# Patient Record
Sex: Female | Born: 1970 | Race: White | Hispanic: No | Marital: Single | Smoking: Former smoker
Health system: Southern US, Community
[De-identification: ages and names within clinical notes are randomized; demographics above are authoritative.]

## PROBLEM LIST (undated history)

## (undated) DIAGNOSIS — T7840XA Allergy, unspecified, initial encounter: Secondary | ICD-10-CM

## (undated) DIAGNOSIS — R112 Nausea with vomiting, unspecified: Secondary | ICD-10-CM

## (undated) DIAGNOSIS — K589 Irritable bowel syndrome without diarrhea: Secondary | ICD-10-CM

## (undated) DIAGNOSIS — Z9889 Other specified postprocedural states: Secondary | ICD-10-CM

## (undated) DIAGNOSIS — K602 Anal fissure, unspecified: Secondary | ICD-10-CM

## (undated) DIAGNOSIS — C4491 Basal cell carcinoma of skin, unspecified: Secondary | ICD-10-CM

## (undated) DIAGNOSIS — IMO0002 Reserved for concepts with insufficient information to code with codable children: Secondary | ICD-10-CM

## (undated) DIAGNOSIS — E119 Type 2 diabetes mellitus without complications: Secondary | ICD-10-CM

## (undated) DIAGNOSIS — F32A Depression, unspecified: Secondary | ICD-10-CM

## (undated) HISTORY — DX: Basal cell carcinoma of skin, unspecified: C44.91

## (undated) HISTORY — DX: Allergy, unspecified, initial encounter: T78.40XA

## (undated) HISTORY — DX: Reserved for concepts with insufficient information to code with codable children: IMO0002

## (undated) HISTORY — DX: Irritable bowel syndrome, unspecified: K58.9

## (undated) HISTORY — PX: WISDOM TOOTH EXTRACTION: SHX21

## (undated) HISTORY — PX: EYE SURGERY: SHX253

## (undated) HISTORY — DX: Anal fissure, unspecified: K60.2

---

## 1993-03-30 DIAGNOSIS — R87619 Unspecified abnormal cytological findings in specimens from cervix uteri: Secondary | ICD-10-CM

## 1993-03-30 DIAGNOSIS — IMO0002 Reserved for concepts with insufficient information to code with codable children: Secondary | ICD-10-CM

## 1993-03-30 HISTORY — DX: Reserved for concepts with insufficient information to code with codable children: IMO0002

## 1993-03-30 HISTORY — DX: Unspecified abnormal cytological findings in specimens from cervix uteri: R87.619

## 2005-05-28 ENCOUNTER — Encounter: Payer: Self-pay | Admitting: Family Medicine

## 2005-05-28 LAB — CONVERTED CEMR LAB

## 2006-02-04 ENCOUNTER — Encounter: Payer: Self-pay | Admitting: Family Medicine

## 2006-02-04 ENCOUNTER — Ambulatory Visit: Payer: Self-pay | Admitting: Family Medicine

## 2006-02-04 DIAGNOSIS — K581 Irritable bowel syndrome with constipation: Secondary | ICD-10-CM | POA: Insufficient documentation

## 2006-02-04 DIAGNOSIS — F32A Depression, unspecified: Secondary | ICD-10-CM | POA: Insufficient documentation

## 2006-02-04 DIAGNOSIS — K644 Residual hemorrhoidal skin tags: Secondary | ICD-10-CM | POA: Insufficient documentation

## 2006-02-04 DIAGNOSIS — F329 Major depressive disorder, single episode, unspecified: Secondary | ICD-10-CM

## 2006-04-05 ENCOUNTER — Ambulatory Visit: Payer: Self-pay | Admitting: Family Medicine

## 2006-04-05 DIAGNOSIS — M545 Low back pain, unspecified: Secondary | ICD-10-CM | POA: Insufficient documentation

## 2006-07-21 ENCOUNTER — Ambulatory Visit: Payer: Self-pay | Admitting: Family Medicine

## 2006-07-21 LAB — CONVERTED CEMR LAB
Ketones, urine, test strip: NEGATIVE
Nitrite: NEGATIVE
Specific Gravity, Urine: >=1.03
Urobilinogen, UA: 0.2
WBC Urine, dipstick: NEGATIVE

## 2006-07-22 ENCOUNTER — Encounter: Payer: Self-pay | Admitting: Family Medicine

## 2006-10-21 ENCOUNTER — Telehealth: Payer: Self-pay | Admitting: Family Medicine

## 2006-10-28 ENCOUNTER — Telehealth: Payer: Self-pay | Admitting: Family Medicine

## 2007-10-11 ENCOUNTER — Encounter: Payer: Self-pay | Admitting: Family

## 2007-10-11 ENCOUNTER — Ambulatory Visit: Payer: Self-pay | Admitting: Family

## 2008-02-22 ENCOUNTER — Ambulatory Visit: Payer: Self-pay | Admitting: Family Medicine

## 2008-03-30 DIAGNOSIS — C4491 Basal cell carcinoma of skin, unspecified: Secondary | ICD-10-CM

## 2008-03-30 HISTORY — DX: Basal cell carcinoma of skin, unspecified: C44.91

## 2008-03-30 HISTORY — PX: SKIN CANCER EXCISION: SHX779

## 2008-05-10 ENCOUNTER — Telehealth: Payer: Self-pay | Admitting: Family Medicine

## 2008-10-17 ENCOUNTER — Encounter: Payer: Self-pay | Admitting: Obstetrics & Gynecology

## 2008-10-17 ENCOUNTER — Ambulatory Visit: Payer: Self-pay | Admitting: Obstetrics & Gynecology

## 2008-10-18 ENCOUNTER — Encounter: Payer: Self-pay | Admitting: Obstetrics & Gynecology

## 2008-10-18 LAB — CONVERTED CEMR LAB
Glucose, Bld: 88 mg/dL (ref 70–99)
LDL Cholesterol: 117 mg/dL — ABNORMAL HIGH (ref 0–99)
Triglycerides: 155 mg/dL — ABNORMAL HIGH (ref <150)
VLDL: 31 mg/dL (ref 0–40)

## 2008-11-07 ENCOUNTER — Ambulatory Visit: Payer: Self-pay | Admitting: Obstetrics & Gynecology

## 2008-12-05 ENCOUNTER — Ambulatory Visit: Payer: Self-pay | Admitting: Family Medicine

## 2008-12-05 LAB — CONVERTED CEMR LAB: Rapid Strep: NEGATIVE

## 2008-12-13 ENCOUNTER — Telehealth: Payer: Self-pay | Admitting: Family Medicine

## 2008-12-27 ENCOUNTER — Telehealth: Payer: Self-pay | Admitting: Family Medicine

## 2008-12-28 ENCOUNTER — Ambulatory Visit: Payer: Self-pay | Admitting: Family Medicine

## 2008-12-28 DIAGNOSIS — J309 Allergic rhinitis, unspecified: Secondary | ICD-10-CM | POA: Insufficient documentation

## 2009-01-08 ENCOUNTER — Ambulatory Visit: Payer: Self-pay | Admitting: Family Medicine

## 2009-01-08 DIAGNOSIS — J45991 Cough variant asthma: Secondary | ICD-10-CM | POA: Insufficient documentation

## 2009-04-08 ENCOUNTER — Encounter: Payer: Self-pay | Admitting: Family Medicine

## 2009-04-08 ENCOUNTER — Ambulatory Visit: Payer: Self-pay | Admitting: Family Medicine

## 2009-06-20 ENCOUNTER — Ambulatory Visit: Payer: Self-pay | Admitting: Family Medicine

## 2009-06-21 ENCOUNTER — Encounter (INDEPENDENT_AMBULATORY_CARE_PROVIDER_SITE_OTHER): Payer: Self-pay | Admitting: *Deleted

## 2009-07-10 ENCOUNTER — Ambulatory Visit (HOSPITAL_COMMUNITY): Payer: Self-pay | Admitting: Licensed Clinical Social Worker

## 2009-07-10 ENCOUNTER — Ambulatory Visit: Payer: Self-pay | Admitting: Family Medicine

## 2009-07-24 ENCOUNTER — Ambulatory Visit: Payer: Self-pay | Admitting: Family Medicine

## 2009-07-24 ENCOUNTER — Ambulatory Visit (HOSPITAL_COMMUNITY): Payer: Self-pay | Admitting: Licensed Clinical Social Worker

## 2009-08-12 ENCOUNTER — Ambulatory Visit (HOSPITAL_COMMUNITY): Payer: Self-pay | Admitting: Licensed Clinical Social Worker

## 2009-08-30 ENCOUNTER — Ambulatory Visit: Payer: Self-pay | Admitting: Family Medicine

## 2009-09-17 ENCOUNTER — Ambulatory Visit (HOSPITAL_COMMUNITY): Payer: Self-pay | Admitting: Licensed Clinical Social Worker

## 2009-10-23 ENCOUNTER — Ambulatory Visit: Payer: Self-pay | Admitting: Obstetrics & Gynecology

## 2009-10-23 LAB — HM PAP SMEAR: HM Pap smear: NEGATIVE

## 2009-10-25 ENCOUNTER — Ambulatory Visit (HOSPITAL_COMMUNITY): Payer: Self-pay | Admitting: Licensed Clinical Social Worker

## 2009-12-24 ENCOUNTER — Ambulatory Visit (HOSPITAL_COMMUNITY): Payer: Self-pay | Admitting: Licensed Clinical Social Worker

## 2010-03-11 ENCOUNTER — Ambulatory Visit: Payer: Self-pay | Admitting: Family Medicine

## 2010-04-22 ENCOUNTER — Other Ambulatory Visit: Payer: Self-pay | Admitting: Family Medicine

## 2010-04-22 DIAGNOSIS — Z1239 Encounter for other screening for malignant neoplasm of breast: Secondary | ICD-10-CM

## 2010-04-29 NOTE — Letter (Signed)
Summary: Depression Questionnaire/[place] Kathryne Sharper  Depression Questionnaire/[place] Kathryne Sharper   Imported By: Lanelle Bal 07/16/2009 12:37:05  _____________________________________________________________________  External Attachment:    Type:   Image     Comment:   External Document

## 2010-04-29 NOTE — Assessment & Plan Note (Signed)
Summary: f/u mood   Vital Signs:  Patient profile:   40 year old female Height:      66 inches Weight:      251 pounds BMI:     40.66 O2 Sat:      98 % on Room air Pulse rate:   68 / minute BP sitting:   108 / 67  (left arm) Cuff size:   large  Vitals Entered By: Payton Spark CMA (August 30, 2009 8:30 AM)  O2 Flow:  Room air CC: F/U mood and depression, Depression   CC:  F/U mood and depression and Depression.  Depression History:      The patient denies significant weight gain, insomnia, hypersomnia, fatigue (loss of energy), and impaired concentration (indecisiveness).        The patient denies that she feels like life is not worth living, denies that she wishes that she were dead, and denies that she has thought about ending her life.        Comments:  has some job stressors cousin has recently been diagnosed with esophageal cancer.  in counseling.  on higher dose of citalopram.  PHQ-9 is down to 3 todays.   Current Medications (verified): 1)  Flonase 50 Mcg/act Susp (Fluticasone Propionate) .... 2 Sprays Per Nostril Daily 2)  Zyrtec Allergy 10 Mg Caps (Cetirizine Hcl) .Marland Kitchen.. 1 Tab By Mouth Q Pm 3)  Proair Hfa 108 (90 Base) Mcg/act Aers (Albuterol Sulfate) .Marland Kitchen.. 1-2 Puffs Q 4-6 Hrs Prn 4)  Citalopram Hydrobromide 40 Mg Tabs (Citalopram Hydrobromide) .... Take One Tablet By Mouth Daily  Allergies (verified): 1)  ! Amoxicillin  Past History:  Past Medical History: Reviewed history from 04/08/2009 and no changes required. IBS, constipation predominant Hemorrhoids allergies/ asthma Spirometry - 1/11 FVC 91%, FEV1 88%, ratio is 81.        Social History: Reviewed history from 02/04/2006 and no changes required. 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 [employer].  College degree. Single and lives alone.   Former Smoker Alcohol use-yes Drug use-no Regular exercise-yes  Review of Systems      See HPI  Physical Exam  General:  alert, well-developed, well-nourished, and  well-hydrated.  obese Head:  normocephalic and atraumatic.   Skin:  color normal.   Psych:  good eye contact, not anxious appearing, and not depressed appearing.     Impression & Recommendations:  Problem # 1:  DEPRESSION (ICD-311) PHQ-9 has come down from 25-->13-->3.  She has a support system (friend and mom).  She had a + experience at [facility] for weight managment.  She is doing well on Citalopram.  RF.  We discussed being in 'remission' x 6 mos prior to discussion about tapering off and she is agreeable w/ this plan. Continue counseling.  Call if any probs, o/w f/u in 3 mos.   Her updated medication list for this problem includes:    Citalopram Hydrobromide 40 Mg Tabs (Citalopram hydrobromide) .Marland Kitchen... Take one tablet by mouth daily  Discussed treatment options, including trial of antidpressant medication. Will refer to behavioral health. Follow-up call in in 24-48 hours and recheck in 2 weeks, sooner as needed. Patient agrees to call if any worsening of symptoms or thoughts of doing harm arise. Verified that the patient has no suicidal ideation at this time.   Problem # 2:  MORBID OBESITY (ICD-278.01) Congratulated her on 6# more wt lost.  Her BMI remains >40.  She is going to keep working on diet, exercise, wt loss.  Complete Medication List: 1)  Flonase 50 Mcg/act Susp (Fluticasone propionate) .... 2 sprays per nostril daily 2)  Zyrtec Allergy 10 Mg Caps (Cetirizine hcl) .Marland Kitchen.. 1 tab by mouth q pm 3)  Proair Hfa 108 (90 Base) Mcg/act Aers (Albuterol sulfate) .Marland Kitchen.. 1-2 puffs q 4-6 hrs prn 4)  Citalopram Hydrobromide 40 Mg Tabs (Citalopram hydrobromide) .... Take one tablet by mouth daily  Patient Instructions: 1)  Continue current meds.  You are doing great. 2)  Keep up the good work! 3)  REturn for f/u in 3 mos. Prescriptions: CITALOPRAM HYDROBROMIDE 40 MG TABS (CITALOPRAM HYDROBROMIDE) take one tablet by mouth daily  #30 x 6   Entered and Authorized by:   Seymour Bars DO   Signed by:    Seymour Bars DO on 08/30/2009   Method used:   Electronically to        [facility] [phone]* (retail)       [address]       [place], [place]  [address]       Ph: [phone]       Fax: [phone]   RxID:   [id number]

## 2010-04-29 NOTE — Letter (Signed)
Summary: Depression Questionnaire  Depression Questionnaire   Imported By: Lanelle Bal 09/09/2009 08:09:03  _____________________________________________________________________  External Attachment:    Type:   Image     Comment:   External Document

## 2010-04-29 NOTE — Letter (Signed)
Summary: Primary Care Consult Scheduled Letter  [place] [facility]  [address]   [place], [place] [address]   Phone: [phone]  Fax: [phone]      06/21/2009 MRN: [id number]  [facility] [address] [place], [place]  [address]    Dear Ms. Covel,    We have scheduled an appointment for you.  At the recommendation of Dr.Metheney, we have scheduled you a consult with [facility],  Merlene Morse  on _April 13, 2011  at 12:30pm .  Their address [phone] [facility] [address] [place] , [place] . The office phone number is [phone].  If this appointment day and time is not convenient for you, please feel free to call the office of the doctor you are being referred to at the number listed above and reschedule the appointment.     It is important for you to keep your scheduled appointments. We are here to make sure you are given good patient care.     Thank you, [facility] Patient Care Coordinator [place]

## 2010-04-29 NOTE — Letter (Signed)
Summary: Depression Questionnaire/Kaitlyn Kirby  Depression Questionnaire/Kaitlyn Kirby   Imported By: Lanelle Bal 06/27/2009 10:53:05  _____________________________________________________________________  External Attachment:    Type:   Image     Comment:   External Document

## 2010-04-29 NOTE — Assessment & Plan Note (Signed)
Summary: fill out paper work   Allergies: 1)  ! Amoxicillin   Complete Medication List: 1)  Flonase 50 Mcg/act Susp (Fluticasone propionate) .... 2 sprays per nostril daily 2)  Zyrtec Allergy 10 Mg Caps (Cetirizine hcl) .Marland Kitchen.. 1 tab by mouth q pm 3)  Proair Hfa 108 (90 Base) Mcg/act Aers (Albuterol sulfate) .Marland Kitchen.. 1-2 puffs q 4-6 hrs prn 4)  Citalopram Hydrobromide 40 Mg Tabs (Citalopram hydrobromide) .... Take one tablet by mouth daily

## 2010-04-29 NOTE — Assessment & Plan Note (Signed)
Summary: Spirometry   Vital Signs:  Patient profile:   40 year old female Height:      66 inches Weight:      256 pounds Pulse rate:   85 / minute BP sitting:   113 / 71  (left arm) Cuff size:   large  Vitals Entered By: Kathlene November (April 08, 2009 2:08 PM) CC: spirometry   Primary Care Provider:  Nani Gasser MD  CC:  spirometry.  History of Present Illness: Her for spirometry . Has several episodes of SOB associ with her allergies. She has been using her inhaler pre-exercise. On days where she has forgotten her inhaler says sometimes is able to work out well without any SOB and othertimes has difficulty. Right now feels her allergies are well controlled on just the Zyrtec.  has stopped her nasal steoid for now. REally struggles in teh spring and fall.  Says has some breathing problems in her family.    Current Medications (verified): 1)  Flonase 50 Mcg/act Susp (Fluticasone Propionate) .... 2 Sprays Per Nostril Daily 2)  Zyrtec Allergy 10 Mg Caps (Cetirizine Hcl) .Marland Kitchen.. 1 Tab By Mouth Q Pm 3)  Proair Hfa 108 (90 Base) Mcg/act Aers (Albuterol Sulfate) .Marland Kitchen.. 1-2 Puffs Q 4-6 Hrs Prn  Allergies (verified): 1)  ! Amoxicillin  Comments:  Nurse/Medical Assistant: The patient's medications and allergies were reviewed with the patient and were updated in the Medication and Allergy Lists. Kathlene November (April 08, 2009 2:09 PM)  Past History:  Past Medical History: IBS, constipation predominant Hemorrhoids allergies/ asthma Spirometry - 1/11 FVC 91%, FEV1 88%, ratio is 81.        Physical Exam  General:  Well-developed,well-nourished,in no acute distress; alert,appropriate and cooperative throughout examination   Impression & Recommendations:  Problem # 1:  COUGH VARIANT ASTHMA (ICD-493.82) Spirometry shows doesn't have asthma.   Likely her SOB is more allergy related. REcommend start her allergy regimen in the fall ans spring and beef it up if needed.  If feels the  inhaler helps her exercise then can refill it but doesn't officially have asthma.  Her updated medication list for this problem includes:    Proair Hfa 108 (90 Base) Mcg/act Aers (Albuterol sulfate) .Marland Kitchen... 1-2 puffs q 4-6 hrs prn  Orders: Albuterol Sulfate Sol 1mg  unit dose (Z6109) Spirometry (Pre & Post) (60454)  Complete Medication List: 1)  Flonase 50 Mcg/act Susp (Fluticasone propionate) .... 2 sprays per nostril daily 2)  Zyrtec Allergy 10 Mg Caps (Cetirizine hcl) .Marland Kitchen.. 1 tab by mouth q pm 3)  Proair Hfa 108 (90 Base) Mcg/act Aers (Albuterol sulfate) .Marland Kitchen.. 1-2 puffs q 4-6 hrs prn   Medication Administration  Medication # 1:    Medication: Albuterol Sulfate Sol 1mg  unit dose    Diagnosis: COUGH VARIANT ASTHMA (UJW-119.14)    Dose: 2.5mg     Route: inhaled    Exp Date: 05/29/2010    Lot #: [id number]    Mfr: Nephron    Patient tolerated medication without complications    Given by: Kathlene November (April 08, 2009 2:09 PM)  Orders Added: 1)  Albuterol Sulfate Sol 1mg  unit dose [O1308] 2)  Spirometry (Pre & Post) [94060]

## 2010-04-29 NOTE — Assessment & Plan Note (Signed)
Summary: REMOVE SKIN TAGS & Depression   Vital Signs:  Patient profile:   40 year old female Height:      66 inches Weight:      260.25 pounds BMI:     42.16 Pulse rate:   80 / minute Pulse rhythm:   regular BP sitting:   121 / 83  (right arm) Cuff size:   large  Vitals Entered By: Mervin Kung CMA (June 20, 2009 4:28 PM) CC: room 6  Pt. states she has skin tags around vaginal area that she would like removed.   Primary Care Provider:  Nani Gasser MD  CC:  room 6  Pt. states she has skin tags around vaginal area that she would like removed.Marland Kitchen  History of Present Illness: room 6  Pt. states she has skin tags around vaginal area that she would like removed. Get irritated as in a friction area and gets very sore and tender. No bleeding.    Prior hx of depession. Thinks slowly has become depressed again. Feels that her obesity and unhappiness with herslef is one of the main driving factors for her depression.  Has dropped working out from 3 days a week to one.   Normally really like working out.  Would like to see a Veterinary surgeon.  Has been on meds before. Tried fluoxetine but it made her nervous.  Also tried paxil but caused weight gain.   Also interested in [facility] for weight loss and counseling. Having a hard time getting out of bed.  Thinks would like to do a 2 week program that includes intensive counseling, meal planning, etc.    Allergies: 1)  ! Amoxicillin   Impression & Recommendations:  Problem # 1:  DEPRESSION (ICD-311) PHQ-9 score was 25 (severe). Discussed options of counseling and medications. Will start citalopram and f/u in 3 weeks. Discussed potential SE and that is may take several weeks for the medication to work. I fully support her wieght loss program where she will get intensive pyschotherapy. I encouraged her to make arrangements to go and I woud write her out of work as I feel this is extremely important to her mental and physical health.   Will  refer for counseling to [facility] as well. Discussed the importance of having someone to continue to work iwith beforeand after she goes to this program.   Her updated medication list for this problem includes:    Citalopram Hydrobromide 20 Mg Tabs (Citalopram hydrobromide) .Marland Kitchen... 1/2 tab by mouth daily for one week, then increase to whole tab daily  Orders: Psychology Referral (Psychology)  Complete Medication List: 1)  Flonase 50 Mcg/act Susp (Fluticasone propionate) .... 2 sprays per nostril daily 2)  Zyrtec Allergy 10 Mg Caps (Cetirizine hcl) .Marland Kitchen.. 1 tab by mouth q pm 3)  Proair Hfa 108 (90 Base) Mcg/act Aers (Albuterol sulfate) .Marland Kitchen.. 1-2 puffs q 4-6 hrs prn 4)  Citalopram Hydrobromide 20 Mg Tabs (Citalopram hydrobromide) .... 1/2 tab by mouth daily for one week, then increase to whole tab daily  Other Orders: Skin Tags Up to 15 any area (11200)  Patient Instructions: 1)  Please schedule a follow-up appointment in 3 weeks to see how doing for mood.  Prescriptions: CITALOPRAM HYDROBROMIDE 20 MG TABS (CITALOPRAM HYDROBROMIDE) 1/2 tab by mouth daily for one week, then increase to whole tab daily  #30 x 0   Entered and Authorized by:   Nani Gasser MD   Signed by:   Nani Gasser MD on 06/20/2009  Method used:   [facility] to        [facility] [phone]* (retail)       [address] [place], [place]  [address]       Ph: [phone] or [phone]       Fax: [phone]   RxID:   [id number]    Procedure Note Last Tetanus: Tdap (02/04/2006)  Skin Tag Removal: The patient complains of pain and irritation.  Procedure # 1: skin tag(s) removed    Region: right inner groin crease    # lesions removed: 1    Instrument used: scissors  Procedure # 2: skin tag(s) removed    Region: Left inner groin crease.     # lesions removed: 1    Instrument used: scissors    Comment: Aluminum chloride solution used for chemical cautery. Pt toelrated well.   Cleaned and  prepped with: betadine Wound dressing: bandaid Additional Instructions: F/U wound care reviewed.   Current Allergies (reviewed today): ! AMOXICILLIN

## 2010-04-29 NOTE — Assessment & Plan Note (Signed)
Summary: F/U depression   Vital Signs:  Patient profile:   40 year old female Height:      66 inches Weight:      257 pounds Pulse rate:   62 / minute BP sitting:   115 / 71  (left arm) Cuff size:   large  Vitals Entered By: Kathlene November (July 10, 2009 10:27 AM) CC: followup depression- pt states can see a little difference on the Citalopram but not much, Hypertension Management   Primary Care Provider:  Nani Gasser MD  CC:  followup depression- pt states can see a little difference on the Citalopram but not much and Hypertension Management.  History of Present Illness: followup depression- pt states can see a little difference on the Citalopram but not much. Hasn't been leaving school feeling like her "heart is going to explode". Has been able to let thinkg go a litttle better. Has actually lost 3 opunds. No suicidal thoughts or "better off dead".  Still sleeping too much.  School is still very stressful.   Hypertension History:      Negative major cardiovascular risk factors include female age less than 33 years old and non-tobacco-user status.    Current Medications (verified): 1)  Flonase 50 Mcg/act Susp (Fluticasone Propionate) .... 2 Sprays Per Nostril Daily 2)  Zyrtec Allergy 10 Mg Caps (Cetirizine Hcl) .Marland Kitchen.. 1 Tab By Mouth Q Pm 3)  Proair Hfa 108 (90 Base) Mcg/act Aers (Albuterol Sulfate) .Marland Kitchen.. 1-2 Puffs Q 4-6 Hrs Prn 4)  Citalopram Hydrobromide 20 Mg Tabs (Citalopram Hydrobromide) .... 1/2 Tab By Mouth Daily For One Week, Then Increase To Whole Tab Daily  Allergies (verified): 1)  ! Amoxicillin  Comments:  Nurse/Medical Assistant: The patient's medications and allergies were reviewed with the patient and were updated in the Medication and Allergy Lists. Kathlene November (July 10, 2009 10:27 AM)  Physical Exam  General:  Well-developed,well-nourished,in no acute distress; alert,appropriate and cooperative throughout examination Psych:  Cognition and judgment  appear intact. Alert and cooperative with normal attention span and concentration. No apparent delusions, illusions, hallucinations   Impression & Recommendations:  Problem # 1:  DEPRESSION (ICD-311) Repeat PHQ-9 today is   13 , which is down from previous score o f25. Recommend increase dose and f/u in 6 weeks. Continue to focus on her overall health. I am excited about her weight loss. Will be happy to fill out her FMLA for her weight loss intensive program that includes pysch counseling.  Her updated medication list for this problem includes:    Citalopram Hydrobromide 40 Mg Tabs (Citalopram hydrobromide) .Marland Kitchen... Take one tablet by mouth daily  Complete Medication List: 1)  Flonase 50 Mcg/act Susp (Fluticasone propionate) .... 2 sprays per nostril daily 2)  Zyrtec Allergy 10 Mg Caps (Cetirizine hcl) .Marland Kitchen.. 1 tab by mouth q pm 3)  Proair Hfa 108 (90 Base) Mcg/act Aers (Albuterol sulfate) .Marland Kitchen.. 1-2 puffs q 4-6 hrs prn 4)  Citalopram Hydrobromide 40 Mg Tabs (Citalopram hydrobromide) .... Take one tablet by mouth daily  Hypertension Assessment/Plan:      The patient's hypertensive risk group is category A: No risk factors and no target organ damage.  Her calculated 10 year risk of coronary heart disease is 1 %.  Today's blood pressure is 115/71.  Her blood pressure goal is < 140/90.   Patient Instructions: 1)  Please schedule a follow-up appointment in 6 weeks for mood.  Prescriptions: PROAIR HFA 108 (90 BASE) MCG/ACT AERS (ALBUTEROL SULFATE) 1-2 puffs q 4-6  hrs prn  #1 x 1   Entered and Authorized by:   Nani Gasser MD   Signed by:   Nani Gasser MD on 07/10/2009   Method used:   [facility]        [facility] [phone]* (retail)       [address] [place], [place]  [address]       Ph: [phone] or [phone]       Fax: [phone]   RxID:   [id number] CITALOPRAM HYDROBROMIDE 40 MG TABS (CITALOPRAM HYDROBROMIDE) take one tablet by mouth daily  #30 x 1    Entered and Authorized by:   Nani Gasser MD   Signed by:   Nani Gasser MD on 07/10/2009   Method used:   [facility] to        [facility] [phone]* (retail)       [address] [place], [place]  [address]       Ph: [phone] or [phone]       Fax: [phone]   RxID:   [id number]

## 2010-05-01 NOTE — Assessment & Plan Note (Signed)
Summary: f/u on mood   Vital Signs:  Patient profile:   40 year old female Height:      66 inches Weight:      247 pounds Pulse rate:   80 / minute BP sitting:   108 / 74  (right arm) Cuff size:   large  Vitals Entered By: Avon Gully CMA, Duncan Dull) (March 11, 2010 2:52 PM) CC: f/u depression   Primary Care Provider:  Nani Gasser MD  CC:  f/u depression.  History of Present Illness: Last seen in August.  Says things don't bother her as much. Feels stress level is high. Sleeping well. Feels happy with her current dose.  Not ready to taper off her meds.  will see Merlene Morse occassionaly for counseling.    Current Medications (verified): 1)  Flonase 50 Mcg/act Susp (Fluticasone Propionate) .... 2 Sprays Per Nostril Daily 2)  Zyrtec Allergy 10 Mg Caps (Cetirizine Hcl) .Marland Kitchen.. 1 Tab By Mouth Q Pm 3)  Proair Hfa 108 (90 Base) Mcg/act Aers (Albuterol Sulfate) .Marland Kitchen.. 1-2 Puffs Q 4-6 Hrs Prn 4)  Citalopram Hydrobromide 40 Mg Tabs (Citalopram Hydrobromide) .... Take One Tablet By Mouth Daily  Allergies (verified): 1)  ! Amoxicillin  Comments:  Nurse/Medical Assistant: The patient's medications and allergies were reviewed with the patient and were updated in the Medication and Allergy Lists. Avon Gully CMA, Duncan Dull) (March 11, 2010 2:53 PM)  Physical Exam  General:  Well-developed,well-nourished,in no acute distress; alert,appropriate and cooperative throughout examination Lungs:  Normal respiratory effort, chest expands symmetrically. Lungs are clear to auscultation, no crackles or wheezes. Heart:  Normal rate and regular rhythm. S1 and S2 normal without gallop, murmur, click, rub or other extra sounds. Psych:  Cognition and judgment appear intact. Alert and cooperative with normal attention span and concentration. No apparent delusions, illusions, hallucinations   Impression & Recommendations:  Problem # 1:  DEPRESSION (ICD-311) PHQ- 9 score 8 today. Overall I  really think she is doing well but have some room for improvement. We discussed keeping current dose or adjusting or getting back into counseling. She decided to try a higher dose on the med.  F/U in 6 weeks. Call if not tolerating well adn can dec dose back down if needed.   will inc her citalopram to 60mg  daily. Her updated medication list for this problem includes:    Citalopram Hydrobromide 40 Mg Tabs (Citalopram hydrobromide) .Marland Kitchen... Take one and 1/2  tablet by mouth daily  Complete Medication List: 1)  Flonase 50 Mcg/act Susp (Fluticasone propionate) .... 2 sprays per nostril daily 2)  Zyrtec Allergy 10 Mg Caps (Cetirizine hcl) .Marland Kitchen.. 1 tab by mouth q pm 3)  Proair Hfa 108 (90 Base) Mcg/act Aers (Albuterol sulfate) .Marland Kitchen.. 1-2 puffs q 4-6 hrs prn 4)  Citalopram Hydrobromide 40 Mg Tabs (Citalopram hydrobromide) .... Take one and 1/2  tablet by mouth daily  Other Orders: T-Mammography Bilateral Screening (04540) Flu Vaccine 64yrs + (98119) Admin 1st Vaccine (14782)  Patient Instructions: 1)  Please schedule a follow-up appointment in 6 weeks for mood.  Prescriptions: CITALOPRAM HYDROBROMIDE 40 MG TABS (CITALOPRAM HYDROBROMIDE) take one and 1/2  tablet by mouth daily  #45 x 3   Entered and Authorized by:   Nani Gasser MD   Signed by:   Nani Gasser MD on 03/11/2010   Method used:   Electronically to        [facility] [phone]* (retail)       [address]  [place], [place]  [address]       Ph: [phone]       Fax: [phone]   RxID:   [id number]    Orders Added: 1)  T-Mammography Bilateral Screening [77057] 2)  Est. Patient Level III [84132] 3)  Flu Vaccine 9yrs + [44010] 4)  Admin 1st Vaccine [27253]   Immunizations Administered:  Influenza Vaccine # 1:    Vaccine Type: Fluvax 3+    Site: right deltoid    Mfr: GlaxoSmithKline    Dose: 0.5 ml    Route: IM    Given by: Sue Lush McCrimmon CMA, (AAMA)    Exp. Date: 09/27/2010    Lot #:  [id number]    VIS given: 10/22/09 version given March 11, 2010.  Flu Vaccine Consent Questions:    Do you have a history of severe allergic reactions to this vaccine? no    Any prior history of allergic reactions to egg and/or gelatin? no    Do you have a sensitivity to the preservative Thimersol? no    Do you have a past history of Guillan-Barre Syndrome? no    Do you currently have an acute febrile illness? no    Have you ever had a severe reaction to latex? no    Vaccine information given and explained to patient? no    Are you currently pregnant? no   Immunizations Administered:  Influenza Vaccine # 1:    Vaccine Type: Fluvax 3+    Site: right deltoid    Mfr: GlaxoSmithKline    Dose: 0.5 ml    Route: IM    Given by: Sue Lush McCrimmon CMA, (AAMA)    Exp. Date: 09/27/2010    Lot #: [id number]    VIS given: 10/22/09 version given March 11, 2010.

## 2010-05-01 NOTE — Letter (Signed)
Summary: Depression Questionnaire  Depression Questionnaire   Imported By: Lanelle Bal 04/01/2010 11:52:47  _____________________________________________________________________  External Attachment:    Type:   Image     Comment:   External Document

## 2010-05-13 ENCOUNTER — Other Ambulatory Visit: Payer: Self-pay | Admitting: Family Medicine

## 2010-05-13 ENCOUNTER — Ambulatory Visit
Admission: RE | Admit: 2010-05-13 | Discharge: 2010-05-13 | Disposition: A | Discharge status: Home / Self Care | Payer: BC Managed Care – PPO | Source: Ambulatory Visit | Attending: Family Medicine | Admitting: Family Medicine

## 2010-05-13 DIAGNOSIS — Z1239 Encounter for other screening for malignant neoplasm of breast: Secondary | ICD-10-CM

## 2010-05-15 LAB — HM MAMMOGRAPHY: HM Mammogram: NORMAL

## 2010-05-19 ENCOUNTER — Encounter: Payer: Self-pay | Admitting: Family Medicine

## 2010-05-19 ENCOUNTER — Ambulatory Visit (INDEPENDENT_AMBULATORY_CARE_PROVIDER_SITE_OTHER): Payer: BC Managed Care – PPO | Admitting: Family Medicine

## 2010-05-19 DIAGNOSIS — L299 Pruritus, unspecified: Secondary | ICD-10-CM

## 2010-05-19 DIAGNOSIS — F329 Major depressive disorder, single episode, unspecified: Secondary | ICD-10-CM

## 2010-05-19 DIAGNOSIS — F3289 Other specified depressive episodes: Secondary | ICD-10-CM

## 2010-05-21 ENCOUNTER — Encounter: Payer: Self-pay | Admitting: Family Medicine

## 2010-05-21 ENCOUNTER — Telehealth: Payer: Self-pay | Admitting: Family Medicine

## 2010-05-22 LAB — CONVERTED CEMR LAB
Alkaline Phosphatase: 47 units/L (ref 39–117)
BUN: 12 mg/dL (ref 6–23)
CO2: 25 meq/L (ref 19–32)
Creatinine, Ser: 0.88 mg/dL (ref 0.40–1.20)
Eosinophils Absolute: 0.2 10*3/uL (ref 0.0–0.7)
Eosinophils Relative: 3 % (ref 0–5)
Glucose, Bld: 100 mg/dL — ABNORMAL HIGH (ref 70–99)
HCT: 40.7 % (ref 36.0–46.0)
Hemoglobin: 13.4 g/dL (ref 12.0–15.0)
Lymphocytes Relative: 44 % (ref 12–46)
Lymphs Abs: 2.7 10*3/uL (ref 0.7–4.0)
MCV: 91.7 fL (ref 78.0–100.0)
Monocytes Absolute: 0.5 10*3/uL (ref 0.1–1.0)
Monocytes Relative: 7 % (ref 3–12)
Platelets: 231 10*3/uL (ref 150–400)
Sodium: 139 meq/L (ref 135–145)
TSH: 1.789 microintl units/mL (ref 0.350–4.500)
Total Bilirubin: 0.8 mg/dL (ref 0.3–1.2)
Total Protein: 6.8 g/dL (ref 6.0–8.3)
WBC: 6.2 10*3/uL (ref 4.0–10.5)

## 2010-05-27 NOTE — Progress Notes (Signed)
  Phone Note Refill Request  on May 21, 2010 8:53 AM  Refills Requested: Medication #1:  CITALOPRAM HYDROBROMIDE 40 MG TABS Take 1 tablet by mouth once a day. Initial call taken by: Avon Gully CMA, Duncan Dull),  May 21, 2010 8:54 AM    Prescriptions: CITALOPRAM HYDROBROMIDE 40 MG TABS (CITALOPRAM HYDROBROMIDE) Take 1 tablet by mouth once a day  #30 x 1   Entered by:   Avon Gully CMA, (AAMA)   Authorized by:   Nani Gasser MD   Signed by:   Avon Gully CMA, (AAMA) on 05/21/2010   Method used:   Electronically to        [facility] [phone]* (retail)       [address] [place], [place]  [address]       Ph: [phone]       Fax: [phone]   RxID:   [id number]

## 2010-05-27 NOTE — Assessment & Plan Note (Signed)
Summary: f/up visit depression, pruritus   Vital Signs:  Patient profile:   40 year old female Height:      66 inches Weight:      249 pounds Pulse rate:   85 / minute BP sitting:   114 / 74  (right arm) Cuff size:   large  Vitals Entered By: Avon Gully CMA, Duncan Dull) (May 19, 2010 3:30 PM) CC: f/u depression   Primary Care Provider:  Nani Gasser MD  CC:  f/u depression.  History of Present Illness: she is here to follow-up her depression.  She is doing well on the one half times of the 40 mg citalopram.  She has been on the current dose for 8 weeks.  She did not notice a big difference between the 40 mg and 60 mg she is currently taking.  She did note approximately 2 weeks ago she had itching in her hands.  After a couple of days it progressed to affecting her feet as well after a few more days it back to her lower extremities.  She did take Benadryl and this did seem to help.  She did not next notice any rash.  She says it seems to be worse at night.  She cannot have anything that she might be allergic to.  She wonders if it could be a side effect of the medication.she knows she did miss her cetirizine for couple of days but the itching and she started before that.  Current Medications (verified): 1)  Flonase 50 Mcg/act Susp (Fluticasone Propionate) .... 2 Sprays Per Nostril Daily 2)  Zyrtec Allergy 10 Mg Caps (Cetirizine Hcl) .Marland Kitchen.. 1 Tab By Mouth Q Pm 3)  Proair Hfa 108 (90 Base) Mcg/act Aers (Albuterol Sulfate) .Marland Kitchen.. 1-2 Puffs Q 4-6 Hrs Prn 4)  Citalopram Hydrobromide 40 Mg Tabs (Citalopram Hydrobromide) .... Take One and 1/2  Tablet By Mouth Daily  Allergies (verified): 1)  ! Amoxicillin  Comments:  Nurse/Medical Assistant: The patient's medications and allergies were reviewed with the patient and were updated in the Medication and Allergy Lists. Avon Gully CMA, Duncan Dull) (May 19, 2010 3:31 PM)  Family History: Reviewed history from 01/08/2009 and no  changes required. Brother with DM Parents with HTN Father died age 41 from MI, kidney stones GF with alcoholism mom asthma / allergies  Social History: Reviewed history from 02/04/2006 and no changes required. 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 [employer].  College degree. Single and lives alone.   Former Smoker Alcohol use-yes Drug use-no Regular exercise-yes  Physical Exam  General:  Well-developed,well-nourished,in no acute distress; alert,appropriate and cooperative throughout examination Lungs:  Normal respiratory effort, chest expands symmetrically. Lungs are clear to auscultation, no crackles or wheezes. Heart:  Normal rate and regular rhythm. S1 and S2 normal without gallop, murmur, click, rub or other extra sounds. Skin:  no rasheson her hands or feet.   Cervical Nodes:  No lymphadenopathy noted Psych:  Cognition and judgment appear intact. Alert and cooperative with normal attention span and concentration. No apparent delusions, illusions, hallucinations   Impression & Recommendations:  Problem # 1:  DEPRESSION (ICD-311) Drop the dose to 40mg . I am not sure if the itching is related to the medication as it is not listed as a common side effect is certainly at the 40-mg works just as well as the 60-mg weekend and minimize her exposure to the higher dose.  If this were allergic reaction it seems a little bit unlikely since she has been on this medication  for over two months at this point. PHQ-9 socre of 4. she is technically in remission.  I would like to keep her well controlled like this for 6 to 12 months and at that point we can consider tapering her off.  She is to call me in one week to let me know if the itching seems improved by decreasing her dose.  Her updated medication list for this problem includes:    Citalopram Hydrobromide 40 Mg Tabs (Citalopram hydrobromide) .Marland Kitchen... Take 1 tablet by mouth once a day  Problem # 2:  PRURITUS (ICD-698.9) in the meantime will also  check her liver enzymes and a blood count to rule out any infectious cause or liver enzyme elevation that might be responsible for her pruritus.  If the Benadryl seems to help she can certainly use this.  That would indicate that this probably is a histamine release which she is responding to the antihistamine Orders: T-CBC w/Diff ([id number]) T-Comprehensive Metabolic Panel ([id number]) T-TSH ([id number])  Complete Medication List: 1)  Flonase 50 Mcg/act Susp (Fluticasone propionate) .... 2 sprays per nostril daily 2)  Zyrtec Allergy 10 Mg Caps (Cetirizine hcl) .Marland Kitchen.. 1 tab by mouth q pm 3)  Proair Hfa 108 (90 Base) Mcg/act Aers (Albuterol sulfate) .Marland Kitchen.. 1-2 puffs q 4-6 hrs prn 4)  Citalopram Hydrobromide 40 Mg Tabs (Citalopram hydrobromide) .... Take 1 tablet by mouth once a day  Patient Instructions: 1)  Please schedule a follow-up appointment in 2 months.  2)  Call if still itching in one month.    Orders Added: 1)  T-CBC w/Diff [[id number]] 2)  T-Comprehensive Metabolic Panel [[id number]] 3)  T-TSH [[id number]] 4)  Est. Patient Level IV [47425]

## 2010-06-05 NOTE — Letter (Signed)
Summary: Depression Questionnaire  Depression Questionnaire   Imported By: Lanelle Bal 05/30/2010 10:49:43  _____________________________________________________________________  External Attachment:    Type:   Image     Comment:   External Document

## 2010-07-31 ENCOUNTER — Other Ambulatory Visit: Payer: Self-pay | Admitting: *Deleted

## 2010-07-31 MED ORDER — ALBUTEROL SULFATE HFA 108 (90 BASE) MCG/ACT IN AERS: 2.0000 | INHALATION_SPRAY | Freq: Four times a day (QID) | RESPIRATORY_TRACT | Status: DC | PRN

## 2010-08-12 NOTE — Assessment & Plan Note (Signed)
Kaitlyn Kirby, Kaitlyn Kirby NO.:  [id number]   MEDICAL RECORD NO.:  [id number]          PATIENT TYPE:  POB   LOCATION:  [facility] [place]         FACILITY:  [facility]   PHYSICIAN:  Allie Bossier, MD        DATE OF BIRTH:  December 07, 1970   DATE OF SERVICE:                                  CLINIC NOTE   IDENTIFICATION:  Kaitlyn Kirby is a 40 year old single white, gravida 0, who  is here for her annual exam.  She comes in here with no particular  complaints.  She does not need refills on any medicines.   PAST MEDICAL HISTORY:  She had an abnormal Pap smear in 1995, but  everything else was negative.  She has a history of depression, in the  past took Wellbutrin, but is currently not on that.  This year she has  been diagnosed with basal cell cancer of her chest and is using Aldara  cream for that, and her last known medical problem is obesity.   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  She has  been abstinent for 5 years.  She is heterosexual.   PAST SURGICAL HISTORY:  None.   FAMILY HISTORY:  Negative for breast, GYN, and colon malignancies, but  is positive for diabetes, hypertension.   No latex allergies.  No known drug allergies.   MEDICATIONS:  The Aldara cream is her only prescription medicine that  she use.   PHYSICAL EXAMINATION:  VITAL SIGNS:  Weight 247, height is 5 feet 6  inches, blood pressure 109/80, pulse 75.  HEENT:  Normal.  BREAST:  Normal bilaterally.  ABDOMEN:  Obese, benign.  No palpable hepatosplenomegaly.  EXTERNAL GENITALIA:  Shaved.  No lesions.  Cervix nulliparous.  No  lesions.  Normal discharge consistent with midcycle ovulation.  Bimanual  exam is relatively noncontributory.  Her uterus feels mid plane and not  enlarged.  Her adnexa are not palpable due to her centripetal obesity.   ASSESSMENT AND PLAN:  Annual exam.  I have checked Pap smear and  recommended self-breast and self-vulvar exams.  For general health  maintenance, I have recommended weight loss and will check a thyroid  (weight gain) as well as a glucose (fasting glucose), obesity and family  history, lipid panel again obesity and family history.     Allie Bossier, MD    MCD/MEDQ  D:  10/17/2008  T:  10/17/2008  Job:  [id number]

## 2010-08-12 NOTE — Assessment & Plan Note (Signed)
NAMENANDI, TONNESEN NO.:  [id number]   MEDICAL RECORD NO.:  [id number]          PATIENT TYPE:  POB   LOCATION:  [facility] [place]         FACILITY:  [facility]   PHYSICIAN:  Sid Falcon, CNM  DATE OF BIRTH:  December 25, 1970   DATE OF SERVICE:                                  CLINIC NOTE   HISTORY:  Ms. Mahr is a 40 year old woman here for a well-woman exam,  new patient, in the [facility].   MENSTRUAL HISTORY:  The patient's LMP was September 19, 2007.  The patient  had begun menses at age 55 with regular cycles with typical 27 days in  between.  There is a light to medium flow and lasts approximately 3  days.  Denies pain with menstruation and no bleeding in between.   CONTRACEPTIVE HISTORY:  The patient is not currently taking birth  control and is not sexually active.   OBSTETRICAL HISTORY:  Never been pregnant.   GYNECOLOGICAL HISTORY:  The patient's last Pap smear was in May 2008,  and was negative.  The patient has had one abnormal Pap smear in 1995.  Had a colpo, which was negative and had a followup repeat Pap, and all  Paps since then have been within normal limits.   SURGICAL HISTORY:  Denies surgeries.   FAMILY HISTORY:  Brother with diabetes.  Father and grandmother with  heart disease.  Father with heart attack.  Brother, mother, and father  with high blood pressure.   The patient reports depression x2 years, taking Wellbutrin with adequate  results.   SOCIAL HISTORY:  The patient denies smoking.  Does drink occasionally  socially.  Denies exposure to sexual partners who use IV drugs and  denies using addicting drugs.  Denies being sexually or physically  abused.   REVIEW OF SYSTEMS:  Only complaint the patient has is fatigue x6 months  with weight loss during the same amount of time with approximately 20  pounds gained.   PHYSICAL EXAMINATION:  VITAL SIGNS:  Pulse 84, blood pressure 100/70,  weight 238.  GENERAL:  The patient is  alert and oriented x3.  No signs of acute  distress.  NECK:  No thyromegaly and no dominant masses.  CARDIOVASCULAR SYSTEM:  Regular rate and rhythm without murmurs,  gallops, or rubs.  LUNGS:  Clear to auscultation bilaterally.  ABDOMEN:  No hepatosplenomegaly.  Positive bowel sounds x4.  Negative  CVA tenderness.  PELVIC:  Vagina, no abnormal discharge, no lesions.  Cervix, negative  cervical motion tenderness; negative abnormal discharge; negative  lesions; small cervix approximately 1 x 1 cm.  Uterus, mobile and  midline.  Adnexa, no dominant masses palpated and nontender.  EXTREMITIES:  No edema.  DTRs 1+ patellar reflexes.   ASSESSMENT:  Well-woman exam.   PLAN:  Reviewed breast exam with the patient.  Reviewed weight loss  suggestions, such as Weight Watchers or decrease in carbohydrates.   LABORATORY DATA:  TSH and a Pap smear sent to lab.  The patient declines  HIV or further STD screening.  The patient has to follow up in 1 year or  sooner as needed.  Sid Falcon, CNM     WM/MEDQ  D:  10/11/2007  T:  10/12/2007  Job:  [id number]

## 2010-08-12 NOTE — Assessment & Plan Note (Signed)
NAMEMONSERATT, LEDIN NO.:  [id number]   MEDICAL RECORD NO.:  [id number]          PATIENT TYPE:  POB   LOCATION:  [facility] [place]         FACILITY:  [facility]   PHYSICIAN:  Elsie Lincoln, MD      DATE OF BIRTH:  1971-03-14   DATE OF SERVICE:  10/23/2009                                  CLINIC NOTE   The patient is a 40 year old female who presents for yearly exam.  The  patient had also Pap smear and a negative colpo.  She had her last Pap  smear in August.  She was supposed to come for every 87-month Pap smear,  but she missed that one appointment.  So if this Pap smear is normal,  she can go to yearly Pap smears.  She is still abstinent.  Does not need  any birth control.  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   PAST MEDICAL HISTORY:  Basal cell carcinoma.   PAST SURGICAL HISTORY:  None.   GYNECOLOGIC HISTORY:  Pap smear as above.  Otherwise negative.  The  patient is nulliparous and abstinent.   OBSTETRICAL HISTORY:  Never been pregnant.   FAMILY HISTORY:  No changes.   SOCIAL HISTORY:  No smoking, occasional drinking, no abuse.   REVIEW OF SYSTEMS:  Negative.   MEDICATIONS:  Celexa, Zyrtec, albuterol.   ALLERGIES:  None.   PHYSICAL EXAMINATION:  VITAL SIGNS:  Pulse 75, blood pressure 112/79,  weight 251, height 66 inches.  GENERAL:  Well nourished, well developed, no apparent stress.  HEENT:  Normocephalic, atraumatic.  NECK:  Thyroid, no masses.  LUNGS:  Clear to auscultation bilaterally.  HEART:  Regular rate and rhythm.  BREASTS:  No masses.  ABDOMEN:  Soft, nontender.  No organomegaly.  No hernia.  PELVIC:  Genitalia, Tanner V.  Vagina pink, normal rugae.  Cervix is  closed, nontender.  Vagina is very tight and hard to do a bimanual.  It  was hard for the patient to relax due to exam, did not get a good feel  of all the pelvic organs but did the best exam possible with patient  compliance.  EXTREMITIES:  No edema.   ASSESSMENT AND PLAN:  A 40 year old female for well-woman exam.  1. Pap smear.  2. Mammogram ordered for November when the patient is 20.  The patient      has no history of familial breast cancer or ovarian cancer.  3. If the Pap smear is abnormal, we would repeat the colpo.  4. The patient does have a history of back pain and has seen a      chiropractor and did not delineate this in the past medical history      and review of systems.           ______________________________  Elsie Lincoln, MD     KL/MEDQ  D:  10/23/2009  T:  10/24/2009  Job:  [id number]

## 2010-09-01 ENCOUNTER — Ambulatory Visit (INDEPENDENT_AMBULATORY_CARE_PROVIDER_SITE_OTHER): Payer: BC Managed Care – PPO | Admitting: Family Medicine

## 2010-09-01 ENCOUNTER — Encounter: Payer: Self-pay | Admitting: Family Medicine

## 2010-09-01 DIAGNOSIS — H109 Unspecified conjunctivitis: Secondary | ICD-10-CM

## 2010-09-01 NOTE — Progress Notes (Signed)
  Subjective:    Patient ID: Kaitlyn Kirby, female    DOB: 02/06/1971, 40 y.o.   MRN: [id number]  HPI Right sided HA behind her right  For abut 4 days. Felt sentive and tender. Took Advil and that helped. Last night noticed her eye was red and could see the blood vessels.  No ithcing, drainage or crusting.  No change in her vision. She feels her HA and her eye are actually a little better today  Review of Systems     Objective:   Physical Exam  Constitutional: She is oriented to person, place, and time. She appears well-developed and well-nourished.  HENT:  Head: Normocephalic and atraumatic.  Right Ear: External ear normal.  Left Ear: External ear normal.  Nose: Nose normal.  Mouth/Throat: Oropharynx is clear and moist.       TMs and canals are clear.   Eyes: EOM are normal. Pupils are equal, round, and reactive to light.       Right scler is mildly injected. No drainge or crusting. No lid edema.   Neck: Neck supple. No thyromegaly present.  Cardiovascular: Normal rate, regular rhythm and normal heart sounds.   Pulmonary/Chest: Effort normal and breath sounds normal. She has no wheezes.  Lymphadenopathy:    She has no cervical adenopathy.  Neurological: She is alert and oriented to person, place, and time.  Skin: Skin is warm and dry.  Psychiatric: She has a normal mood and affect.          Assessment & Plan:  Right eye injection- No likely allergic eye, or dry eye, or bacterial conjunctivitis. I do recommend she see her optimetrist for further evaluation of her eye since having pain or HA behind and above the eye. No vision changes. She is feeling better today so may be resolving. It gets crusting or drianage then call the office and will call in topical ABX drops.

## 2010-09-18 ENCOUNTER — Other Ambulatory Visit: Payer: Self-pay | Admitting: *Deleted

## 2010-09-18 MED ORDER — CITALOPRAM HYDROBROMIDE 40 MG PO TABS: 40.0000 mg | ORAL_TABLET | Freq: Every day | ORAL | Status: DC

## 2010-10-13 ENCOUNTER — Telehealth: Payer: Self-pay | Admitting: Family Medicine

## 2010-10-13 NOTE — Telephone Encounter (Signed)
Pt called and has hemmorhoids X 4 weeks now.  Now bleeding.  Very painful with B/M's and bleeding, and even painful 2-3 hours after BM.  OTC meds not working. Plan:  Told pt since OTC meds not working and bleeding involved with pain.

## 2010-10-14 ENCOUNTER — Encounter: Payer: Self-pay | Admitting: Family Medicine

## 2010-10-15 ENCOUNTER — Ambulatory Visit (INDEPENDENT_AMBULATORY_CARE_PROVIDER_SITE_OTHER): Payer: BC Managed Care – PPO | Admitting: Family Medicine

## 2010-10-15 ENCOUNTER — Encounter: Payer: Self-pay | Admitting: Family Medicine

## 2010-10-15 DIAGNOSIS — F3289 Other specified depressive episodes: Secondary | ICD-10-CM

## 2010-10-15 DIAGNOSIS — F329 Major depressive disorder, single episode, unspecified: Secondary | ICD-10-CM

## 2010-10-15 DIAGNOSIS — K649 Unspecified hemorrhoids: Secondary | ICD-10-CM

## 2010-10-15 MED ORDER — LIDOCAINE-HYDROCORTISONE ACE 3-2.5 % RE KIT: 1.0000 | PACK | Freq: Two times a day (BID) | RECTAL | Status: DC

## 2010-10-15 NOTE — Progress Notes (Signed)
  Subjective:    Patient ID: Kaitlyn Kirby, female    DOB: 20-Dec-1970, 40 y.o.   MRN: [id number]  HPI Hemorrhorids  she has had these in the past and typically they flare about once a year. She is concerned because the last 2 times that she's had flares it's taken such a long time for them to resolve. Have ben flaring for 3 weeks.  She does struggle some with constipation.Marland Kitchen  Has been soaking epsom salt bath.  Has been using an OTC cream. Ran out of rx med. Bright red bleeding after bowel movements. She said she had one day where she bled for almost 24 hours. That has since resolved. She did try some MiraLax last night but hasn't started it..   Depression - Doing well on citalopram.  Sleep is better. She feels more calm. Less irritable. She also has less stress in her life right now too and thinks that has helped. Wants to continue with her med.   Review of Systems     Objective:   Physical Exam  Constitutional: She appears well-developed and well-nourished.  Genitourinary: Rectal exam shows external hemorrhoid and tenderness. Rectal exam shows no fissure and no mass.       She has a few mildly inflamed hemorrhoids. No thromboses. The most recent to extend internally. Do not see any fissures or tears externally. I did not do an internal exam.          Assessment & Plan:  Hemorrhoids-we discussed the importance of keeping her stools soft. I do think she should start the MiraLax, continue with the Epsom salts soaks, and I will give her a prescription for anocort to use for pain relief and inflammation. If she's not improving over the next 2-3 weeks and I recommend a consult with surgery to see if there is anything further that can be done and to evaluate more internally.

## 2010-10-15 NOTE — Patient Instructions (Signed)
F/u in 6 months for mood.

## 2010-10-15 NOTE — Assessment & Plan Note (Signed)
She is doing really well on her current regimen. Her PHQ 9 score is 4 today which means she is at goal. Followup in 6 months.

## 2010-11-01 ENCOUNTER — Encounter: Payer: Self-pay | Admitting: *Deleted

## 2010-11-10 ENCOUNTER — Encounter: Payer: Self-pay | Admitting: Advanced Practice Midwife

## 2010-11-10 ENCOUNTER — Ambulatory Visit (INDEPENDENT_AMBULATORY_CARE_PROVIDER_SITE_OTHER): Payer: BC Managed Care – PPO | Admitting: Advanced Practice Midwife

## 2010-11-10 VITALS — BP 115/83 | HR 81 | Temp 98.0°F | Resp 16 | Ht 66.0 in | Wt 254.0 lb

## 2010-11-10 DIAGNOSIS — Z1272 Encounter for screening for malignant neoplasm of vagina: Secondary | ICD-10-CM

## 2010-11-10 DIAGNOSIS — Z01419 Encounter for gynecological examination (general) (routine) without abnormal findings: Secondary | ICD-10-CM

## 2010-11-10 DIAGNOSIS — Z113 Encounter for screening for infections with a predominantly sexual mode of transmission: Secondary | ICD-10-CM

## 2010-11-10 MED ORDER — CETIRIZINE HCL 10 MG PO CAPS: 10.0000 mg | ORAL_CAPSULE | Freq: Every day | ORAL | Status: DC

## 2010-11-10 NOTE — Patient Instructions (Signed)
Place annual gynecologic exam patient instructions here.

## 2010-11-10 NOTE — Progress Notes (Signed)
  Subjective:     Kaitlyn Kirby is a 40 y.o. female and is here for a comprehensive GYN exam. The patient reports problems - allergies. States she is still abstinent.  Declines contraception.  Had Mammo in Feb.  No problems with menses.  Would like Rx for Zyrtec.  History   Social History  . Marital Status: Single    Spouse Name: N/A    Number of Children: N/A  . Years of Education: N/A   Occupational History  . Not on file.   Social History Main Topics  . Smoking status: Former Smoker -- 0.5 packs/day    Quit date: 11/10/1995  . Smokeless tobacco: Never Used  . Alcohol Use: Yes     occasional  . Drug Use: No  . Sexually Active: Not on file   Other Topics Concern  . Not on file   Social History Narrative  . No narrative on file   Health Maintenance  Topic Date Due  . Pap Smear  02/04/1988  . Tetanus/tdap  02/05/2016    The following portions of the patient's history were reviewed and updated as appropriate: allergies, current medications, past family history, past medical history, past social history, past surgical history and problem list.  Review of Systems A comprehensive review of systems was negative except for: Gastrointestinal: positive for hemorrhoids   Objective:    General appearance: alert, cooperative, appears stated age and no distress Neck: no adenopathy, supple, symmetrical, trachea midline and thyroid not enlarged, symmetric, no tenderness/mass/nodules Lungs: clear to auscultation bilaterally Breasts: normal appearance, no masses or tenderness, Normal to palpation without dominant masses Heart: regular rate and rhythm, S1, S2 normal, no murmur, click, rub or gallop Abdomen: soft, non-tender; bowel sounds normal; no masses,  no organomegaly Pelvic: cervix normal in appearance, external genitalia normal, no adnexal masses or tenderness, no cervical motion tenderness, rectovaginal septum normal, uterus normal size, shape, and consistency and vagina normal  without discharge Extremities: extremities normal, atraumatic, no cyanosis or edema    Assessment:    Healthy female exam.  Hemorrhoids Allergies     Plan:    Pap sent Rx Zyrtec Mammo in Feb Primary doctor will follow medical issues and screening  See After Visit Summary for Counseling Recommendations

## 2010-12-24 ENCOUNTER — Telehealth: Payer: Self-pay | Admitting: Family Medicine

## 2010-12-24 DIAGNOSIS — K649 Unspecified hemorrhoids: Secondary | ICD-10-CM

## 2010-12-24 NOTE — Telephone Encounter (Signed)
Referral entered  

## 2010-12-24 NOTE — Telephone Encounter (Signed)
Pt called and was seen back in JUly 2012 for hemorrhoids.  Has still continued to have problems with pain having BM's.  Stools are soft.  No constipation, but the hemorrhoids cont to be a problem.  Using the anucort for pain and inflammation, the epsom salt soaks, and keeping the stools soft, but the pain is bad. Plan:  Routed to Dr. Linford Arnold to order surgical consult. Jarvis Newcomer, LPN Domingo Dimes

## 2010-12-26 ENCOUNTER — Telehealth: Payer: Self-pay | Admitting: Family Medicine

## 2010-12-26 NOTE — Telephone Encounter (Signed)
Called the pt and let her know that the provider was referring her for the hemorrhoids and someone should be calling her to get that referral appt. Scheduled. Jarvis Newcomer, LPN Dom[facility] Dimes

## 2011-01-02 ENCOUNTER — Ambulatory Visit (INDEPENDENT_AMBULATORY_CARE_PROVIDER_SITE_OTHER): Payer: BC Managed Care – PPO | Admitting: Surgery

## 2011-01-02 ENCOUNTER — Encounter (INDEPENDENT_AMBULATORY_CARE_PROVIDER_SITE_OTHER): Payer: Self-pay | Admitting: Surgery

## 2011-01-02 VITALS — BP 123/82 | HR 77 | Ht 66.0 in | Wt 262.0 lb

## 2011-01-02 DIAGNOSIS — K602 Anal fissure, unspecified: Secondary | ICD-10-CM | POA: Insufficient documentation

## 2011-01-02 DIAGNOSIS — K581 Irritable bowel syndrome with constipation: Secondary | ICD-10-CM

## 2011-01-02 DIAGNOSIS — K589 Irritable bowel syndrome without diarrhea: Secondary | ICD-10-CM

## 2011-01-02 DIAGNOSIS — K644 Residual hemorrhoidal skin tags: Secondary | ICD-10-CM

## 2011-01-02 NOTE — Progress Notes (Signed)
Subjective:     Patient ID: Kaitlyn Kirby, female   DOB: May 06, 1970, 40 y.o.   MRN: [id number]  HPI  Patient Care Team: Nani Gasser, MD as PCP - General  This patient is a 40 y.o.female who presents today for surgical evaluation.   Reason for visit: Annual pain probable hemorrhoids.  Patient is a morbidly obese female who struggled with hemorrhoids since she was in college. She Sharples with chronic constipation and has a bowel movement twice a week. She cannot tolerate fibrinogens is very gassy and bloaty. He was recently recommended to her by her primary care physician that she should switch to MiraLAX. She is trying to use it. However, she admits she's not always compliant. She's tried increased vegetables in her diet.  Usually she will get a flare of of anal pain that lasts for a few weeks. This happens once a year. It is usually bleeding and burning/irritation. Usually with the help of Epson salts and time it improves. However, she had an attack about 3 months ago and it's never really resolved. She has a lot of pain during defecation. It is very sharp and intense. She occasionally will get some bleeding. She saw her primary care physician. She was started on [place] cream. That seems to help. However, things have not completely resolved. She's never had any interventions for hemorrhoids such as injections or banding or surgeries. No prior abdominal surgeries. She recalls getting a sigmoidoscopy 10 years ago which just noted hemorrhoids and no other problems. No history of inflammatory bowel disease or colitis that she can recall. There is some discussion of her having irritable bowel syndrome but she denies any history of bad diarrhea.  Past Medical History  Diagnosis Date  . IBS (irritable bowel syndrome) 8-10 years ago  . Hemorrhoids   . Allergy   . Asthma     bronchial spasms  . Abnormal Pap smear 1995    Colpo  . Basal cell carcinoma 2010    Past Surgical History    Procedure Date  . Skin cancer excision 2010    chest, basal cell  . Wisdom tooth extraction 40 years old    History   Social History  . Marital Status: Single    Spouse Name: N/A    Number of Children: N/A  . Years of Education: N/A   Occupational History  . Not on file.   Social History Main Topics  . Smoking status: Former Smoker -- 0.5 packs/day    Quit date: 11/10/1995  . Smokeless tobacco: Never Used  . Alcohol Use: Yes     occasional  . Drug Use: No  . Sexually Active: No   Other Topics Concern  . Not on file   Social History Narrative  . No narrative on file    Family History  Problem Relation Age of Onset  . Hypertension Mother   . Hypertension Father   . Heart attack Father   . Diabetes Brother   . Thyroid disease Mother     Current outpatient prescriptions:albuterol (PROVENTIL HFA;VENTOLIN HFA) 108 (90 BASE) MCG/ACT inhaler, Inhale 2 puffs into the lungs every 6 (six) hours as needed for wheezing., Disp: 6.7 g, Rfl: 1;  AMBULATORY NON FORMULARY MEDICATION, Place 1 application around the anus 4 (four) times daily. Medication Name: DILTIAZEM cream 2% , Disp: , Rfl: ;  cetirizine (ZYRTEC) 10 MG tablet, Take 10 mg by mouth daily.  , Disp: , Rfl:  Cetirizine HCl (ZYRTEC ALLERGY) 10 MG CAPS,  Take 1 capsule (10 mg total) by mouth daily., Disp: 30 capsule, Rfl: 12;  citalopram (CELEXA) 40 MG tablet, Take 1 tablet (40 mg total) by mouth daily., Disp: 30 tablet, Rfl: 2;  fluticasone (FLONASE) 50 MCG/ACT nasal spray, Place 2 sprays into the nose as needed. , Disp: , Rfl: ;  Lidocaine-Hydrocortisone Ace 3-2.5 % KIT, Place 1 kit rectally 2 (two) times daily., Disp: 1 each, Rfl: 2  Allergies  Allergen Reactions  . Amoxicillin Hives       Review of Systems  Constitutional: Negative for fever, chills, diaphoresis, appetite change and fatigue.  HENT: Negative for ear pain, sore throat, trouble swallowing, neck pain and ear discharge.   Eyes: Negative for  photophobia, discharge and visual disturbance.  Respiratory: Negative for cough, choking, chest tightness and shortness of breath.   Cardiovascular: Negative for chest pain and palpitations.  Gastrointestinal: Positive for constipation, anal bleeding and rectal pain. Negative for nausea, vomiting, abdominal pain, diarrhea and blood in stool.  Genitourinary: Negative for dysuria, urgency, frequency, vaginal bleeding, vaginal discharge, difficulty urinating, vaginal pain, menstrual problem and pelvic pain.  Musculoskeletal: Negative for myalgias, arthralgias and gait problem.  Skin: Negative for color change, pallor and rash.  Neurological: Negative for dizziness, speech difficulty, weakness and numbness.  Hematological: Negative for adenopathy.  Psychiatric/Behavioral: Negative for confusion and agitation. The patient is not nervous/anxious.        Objective:   Physical Exam  Constitutional: She is oriented to person, place, and time. She appears well-developed and well-nourished. No distress.  HENT:  Head: Normocephalic.  Mouth/Throat: Oropharynx is clear and moist. No oropharyngeal exudate.  Eyes: Conjunctivae and EOM are normal. Pupils are equal, round, and reactive to light. No scleral icterus.  Neck: Normal range of motion. Neck supple. No tracheal deviation present.  Cardiovascular: Normal rate, regular rhythm and intact distal pulses.   Pulmonary/Chest: Effort normal and breath sounds normal. No respiratory distress. She exhibits no tenderness.  Abdominal: Soft. Bowel sounds are normal. She exhibits no distension and no mass. There is no tenderness. There is no rebound and no guarding. Hernia confirmed negative in the right inguinal area and confirmed negative in the left inguinal area.       Obese but soft  Genitourinary: Vagina normal. No vaginal discharge found.       Perianal skin clean with good hygiene.  No pruritis.  .  No abscess/fistula.  No pilonidal disease.  Few small ext  hemorrhoids  Increased sphincter tone.  Posterior midline fissure with sentinel tag. Tender.  Barely tolerates digital rectal exam.  No rectal masses.  Hemorrhoidal piles palpated & not very large   Musculoskeletal: Normal range of motion. She exhibits no tenderness.  Lymphadenopathy:    She has no cervical adenopathy.       Right: No inguinal adenopathy present.       Left: No inguinal adenopathy present.  Neurological: She is alert and oriented to person, place, and time. No cranial nerve deficit. She exhibits normal muscle tone. Coordination normal.  Skin: Skin is warm and dry. No rash noted. She is not diaphoretic. No erythema.  Psychiatric: She has a normal mood and affect. Her behavior is normal. Judgment and thought content normal.       Anxious & apologetic, but consolable.       Assessment:     Anal fissure with posterior sentinel tag in the setting of moderately severe chronic constipation.  Probable intermittently inflamed hemorrhoids    Plan:  The anatomy & physiology of the anorectal region was discussed.  The pathophysiology of anal fissure and differential diagnosis was discussed.  Natural history progression  was discussed.   I stressed the importance of a bowel regimen to have daily soft bowel movements to minimize progression of disease.   I feel that is the trigger of the problem.  I discussed the use of warm soaks &  Prescribed a muscle relaxant, diltiazem, to help the anal sphincter relax, allow the spasming to stop, and help the tear/fissure to heal.  If non-operative treatment does not heal the fissure, I would recommend examination under anesthesia for better examination to confirm the diagnosis and treat by lateral internal sphincterotomy to allow the fissure to heal.  Technique, benefits, alternatives discussed.  Risks such as bleeding, pain, incontinence, recurrence, heart attack, death, and other risks were discussed.    Educational handouts further  explaining the pathology, treatment options, and bowel regimen were given as well.  The patient expressed understanding & will follow up ~3weeks, PRN if all symptoms resolve.  If the pain does not resolve in a few weeks or worsens, he should proceed with surgery.

## 2011-01-02 NOTE — Patient Instructions (Signed)
Anal Fissure An anal fissure often begins with sharp pain. This is usually following a bowel movement. It often causes bright red blood stained stools. It is the most common cause of rectal bleeding. One common cause of this is passage of a large, hard stool. It can also be caused by having frequent diarrheal stools. Anal fissures that occur for a longtime (chronic) may require surgery. CAUSES  Passing large, hard stools.   Frequent diarrheal stools.   Constipation.  SYMPTOMS  Bright red, blood stained stools.   Rectal bleeding.  HOME CARE INSTRUCTIONS  If constipation is the cause of the rectal fissure, it may be necessary to add a bulk-forming laxative. A diet high in fruits, whole grains, and vegetables will also help.   Taking hot sitz baths for 1 half hour 4 times per day may help.   Increase your fluid intake.   Only take over-the-counter or prescription medicines for pain, discomfort, or fever as directed by your caregiver. Do not take aspirin as this may increase the tendency for bleeding.   HEMORRHOIDS   The rectum is the last few inches of your colon, and it naturally stretches to hold stool.  Hemorrhoidal piles are natural clusters of blood vessels that help the rectum stretch to hold stool and allow bowel movements to eliminate feces.  Hemorrhoids are abnormally swollen blood vessels in the rectum.  Too much pressure in the rectum causes hemorrhoids by forcing blood to stretch and bulge the walls of the veins, sometimes even rupturing them.  Hemorrhoids can become like varicose veins you might see on a person's legs. When bulging hemorrhoidal veins are irritated, they can swell, burn, itch, become very painful, and bleed. Once the rectal veins have been stretched out and hemorrhoids created, they are difficult to get rid of completely and tend to recur with less straining than it took to cause them in the first place. Fortunately, good habits and simple medical treatment  usually control hemorrhoids well, and surgery is only recommended in unusually severe cases. Some of the most frequent causes of hemorrhoids:    Constant sitting    Straining with bowel movements (from constipation or hard stools)    Diarrhea    Sitting on the toilet for a long time    Severe coughing    Childbirth    Heavy Lifting  Types of Hemorrhoids:    Internal hemorrhoids usually don't hurt or itch; they are deep inside the rectum and usually have no sensation. However, internal hemorrhoids can bleed.  Such bleeding should not be ignored and mask blood from a dangerous source like colorectal cancer, so persistent rectal bleeding should be investigated with a colonoscopy.    External hemorrhoids cause most of the symptoms - pain, burning, and itching. Unirritated hemorrhoids can look like small skin tags coming out of the anus.     Thrombosed hemorrhoids can form when a hemorrhoid blood vessel bursts and causes the hemorrhoid to swell.  A purple blood clot can form in it and become an excruciatingly painful lump at the anus. Because of these unpleasant symptoms, immediate incision and drainage by a surgeon at an office visit can provide much relief of the pain.    PREVENTION Avoiding the causes listed in above will prevent most cases of hemorrhoids, but this advice is sometimes hard to follow:  How can you avoid sitting all day if you have a seated job? Also, we try to avoid coughing and diarrhea, but sometimes it's beyond your control.  Still, there are some practical hints to help:    If your main job activity is seated, always stand or walk during your breaks. Make it a point to stand and walk at least 5 minutes every hour and try to shift frequently in your chair to avoid direct rectal pressure.    Always exhale as you strain or lift. Don't hold your breath.    Treat coughing, diarrhea and constipation early since irritated hemorrhoids may soon follow.    Do not delay or try to prevent a  bowel movement when the urge is present.   Exercise regularly (walking or jogging 60 minutes a day) to stimulate the bowels to move.   Avoid dry toilet paper when cleaning after bowel movements.  Moistened tissues such as baby wipes are less irritating.  Lightly pat the rectal area dry.  Using irrigating showers or bottle irrigation washing can more gently clean this sensitive area.   Keep the anal and genital area clean and  dry.  Talcum or baby powders can help   GET YOUR STOOLS SOFT.   This is the most important way to prevent irritated hemorrhoids.  Hard stools are like sandpaper to the anorectal canal and will cause more problems.   The goal: ONE SOFT BOWEL MOVEMENT A DAY!  To have soft, regular bowel movements:    Drink at least 8 tall glasses of water a day.     AVOID CONSTIPATION    Take plenty of fiber.  Fiber is the undigested part of plant food that passes into the colon, acting s "natures broom" to encourage bowel motility and movement.  Fiber can absorb and hold large amounts of water. This results in a larger, bulkier stool, which is soft and easier to pass. Work gradually over several weeks up to 6 servings a day of fiber (25g a day even more if needed) in the form of: o Vegetables -- Root (potatoes, carrots, turnips), leafy green (lettuce, salad greens, celery, spinach), or cooked high residue (cabbage, broccoli, etc) o Fruit -- Fresh (unpeeled skin & pulp), Dried (prunes, apricots, cherries, etc ),  or stewed ( applesauce)  o Whole grain breads, pasta, etc (whole wheat)  o Bran cereals    Bulking Agents -- This type of water-retaining fiber generally is easily obtained each day by one of the following:  o Psyllium bran -- The psyllium plant is remarkable because its ground seeds can retain so much water. This product is available as Metamucil, Konsyl, Effersyllium, Per Diem Fiber, or the less expensive generic preparation in drug and health food stores. Although labeled a laxative, it  really is not a laxative.  o Methylcellulose -- This is another fiber derived from wood which also retains water. It is available as Citrucel. o Polyethylene Glycol - and "artificial" fiber commonly called Miralax or Glycolax.  It is helpful for people with gassy or bloated feelings with regular fiber o Flax Seed - a less gassy fiber than psyllium   No reading or other relaxing activity while on the toilet. If bowel movements take longer than 5 minutes, you are too constipated   Laxatives can be useful for a short period if constipation is severe o Osmotics (Milk of Magnesia, Fleets phosphosoda, Magnesium citrate, MiraLax, GoLytely) are safer than  o Stimulants (Senokot, Castor Oil, Dulcolax, Ex Lax)    o Do not take laxatives for more than 7days in a row.   Laxatives are not a good long-term solution as it can stress  the intestine and colon and causes too much mineral and fluid losses.    If badly constipated, try a Bowel Retraining Program: o Do not use laxatives.  o Eat a diet high in roughage, such as bran cereals and leafy vegetables.  o Drink six (6) ounces of prune or apricot juice each morning.  o Eat two (2) large servings of stewed fruit each day.  o Take one (1) heaping dose of a bulking agent (ex. Metamucil, Citrucel, Miralax) twice a day.  o Use sugar-free sweetener when possible to avoid excessive calories.  o Eat a normal breakfast.  o Set aside 15 minutes after breakfast to sit on the toilet, but do not strain to have a bowel movement.  o If you do not have a bowel movement by the third day, use an enema and repeat the above steps.    AVOID DIARRHEA o Switch to liquids and simpler foods for a few days to avoid stressing your intestines further. o Avoid dairy products (especially milk & ice cream) for a short time.  The intestines often can lose the ability to digest lactose when stressed. o Avoid foods that cause gassiness or bloating.  Typical foods include beans and other  legumes, cabbage, broccoli, and dairy foods.  Every person has some sensitivity to other foods, so listen to our body and avoid those foods that trigger problems for you. o Adding fiber (Citrucel, Metamucil, psyllium, Miralax) gradually can help thicken stools by absorbing excess fluid and retrain the intestines to act more normally.  Slowly increase the dose over a few weeks.  Too much fiber too soon can backfire and cause cramping & bloating. o Probiotics (such as active yogurt, Align, etc) may help repopulate the intestines and colon with normal bacteria and calm down a sensitive digestive tract.  Most studies show it to be of mild help, though, and such products can be costly. o Medicines:   Bismuth subsalicylate (ex. Kayopectate, Pepto Bismol) every 30 minutes for up to 6 doses can help control diarrhea.  Avoid if pregnant.   Loperamide (Immodium) can slow down diarrhea.  Start with two tablets (4mg  total) first and then try one tablet every 6 hours.  Avoid if you are having fevers or severe pain.  If you are not better or start feeling worse, stop all medicines and call your doctor for advice o Call your doctor if you are getting worse or not better.  Sometimes further testing (cultures, endoscopy, X-ray studies, bloodwork, etc) may be needed to help diagnose and treat the cause of the diarrhea.   If these preventive measures fail, you must take action right away! Hemorrhoids are one condition that can be mild in the morning and become intolerable by nightfall.       Do not use ointments containing anesthetic medications or hydrocortisone. They could slow healing.   Avoid constipating foods such as bananas and cheese.   In children, brown Karo syrup may be used by adding 1 teaspoon of syrup to 8 ounces of formula. An alternative is to give 3 teaspoons of mineral oil every day.  SEEK MEDICAL CARE IF: Rectal bleeding continues, changes in intensity, or becomes more severe. MAKE SURE YOU:     Understand these instructions.   Will watch your condition.   Will get help right away if you are not doing well or get worse.  Document Released: 03/16/2005 Document Re-Released: 06/10/2009 [facility] Patient Information 2011 [place], [place].

## 2011-01-07 ENCOUNTER — Other Ambulatory Visit: Payer: Self-pay | Admitting: Family Medicine

## 2011-01-23 ENCOUNTER — Encounter (INDEPENDENT_AMBULATORY_CARE_PROVIDER_SITE_OTHER): Payer: BC Managed Care – PPO | Admitting: Surgery

## 2011-03-08 ENCOUNTER — Other Ambulatory Visit: Payer: Self-pay | Admitting: Family Medicine

## 2011-04-08 ENCOUNTER — Other Ambulatory Visit: Payer: Self-pay | Admitting: *Deleted

## 2011-04-08 MED ORDER — CITALOPRAM HYDROBROMIDE 40 MG PO TABS: 40.0000 mg | ORAL_TABLET | Freq: Every day | ORAL | Status: DC

## 2011-04-22 ENCOUNTER — Other Ambulatory Visit: Payer: Self-pay | Admitting: Family Medicine

## 2011-04-22 DIAGNOSIS — Z1231 Encounter for screening mammogram for malignant neoplasm of breast: Secondary | ICD-10-CM

## 2011-05-12 ENCOUNTER — Ambulatory Visit
Admission: RE | Admit: 2011-05-12 | Discharge: 2011-05-12 | Disposition: A | Discharge status: Home / Self Care | Payer: BC Managed Care – PPO | Source: Ambulatory Visit | Attending: Family Medicine | Admitting: Family Medicine

## 2011-05-12 DIAGNOSIS — Z1231 Encounter for screening mammogram for malignant neoplasm of breast: Secondary | ICD-10-CM

## 2011-05-20 ENCOUNTER — Other Ambulatory Visit: Payer: Self-pay | Admitting: *Deleted

## 2011-05-20 MED ORDER — CITALOPRAM HYDROBROMIDE 40 MG PO TABS: 40.0000 mg | ORAL_TABLET | Freq: Every day | ORAL | Status: DC

## 2011-05-22 ENCOUNTER — Ambulatory Visit (INDEPENDENT_AMBULATORY_CARE_PROVIDER_SITE_OTHER): Payer: BC Managed Care – PPO | Admitting: Family Medicine

## 2011-05-22 ENCOUNTER — Encounter: Payer: Self-pay | Admitting: Family Medicine

## 2011-05-22 VITALS — BP 106/70 | HR 71 | Ht 66.0 in | Wt 271.0 lb

## 2011-05-22 DIAGNOSIS — F3289 Other specified depressive episodes: Secondary | ICD-10-CM

## 2011-05-22 DIAGNOSIS — M79609 Pain in unspecified limb: Secondary | ICD-10-CM

## 2011-05-22 DIAGNOSIS — F32A Depression, unspecified: Secondary | ICD-10-CM

## 2011-05-22 DIAGNOSIS — M79641 Pain in right hand: Secondary | ICD-10-CM

## 2011-05-22 DIAGNOSIS — F329 Major depressive disorder, single episode, unspecified: Secondary | ICD-10-CM

## 2011-05-22 MED ORDER — CITALOPRAM HYDROBROMIDE 20 MG PO TABS: 20.0000 mg | ORAL_TABLET | Freq: Every day | ORAL | Status: DC

## 2011-05-22 NOTE — Progress Notes (Signed)
  Subjective:    Patient ID: Kaitlyn Kirby, female    DOB: [birth year], 41 y.o.   MRN: [id number]  HPI F/u on Mood. Overall she's doing well. She denies any side effects on the medication. This is her six-month followup. She admits she has not been exercising regularly and says that when she does it does help her mood. She's had a lot of work with school right now. She is looking forward to the summer. She is happy with her current regimen. She denies any major sleep problems.  Right hand pain. Fell in August and her right hand was bruised between the thumb and first finger. She fell in a way that her thumb and first finger were spread apart. It was sore for a long time but never had it xrayed. Now noticed weakneded grip and pain with gripping such as opening a water bottle and pain at the base of the thumb. She has been trying to do some exercises on her own home by taking a rubber band and stretching around her fingers and trying to open and close her hand. She denies any swelling or redness recently.   Review of Systems     Objective:   Physical Exam  Constitutional: She appears well-developed and well-nourished.  Musculoskeletal:       Right wrist with NROM. Nontender. Neg for [place].  Has pain at the base with grip. Grip strength seems normal. I do palpate a calcified area at the base of the thumb.   Skin: Skin is warm and dry.  Psychiatric: She has a normal mood and affect.          Assessment & Plan:  Depression - PHQ -9 score o f5 today.  She has been doing very well for 6 months.  Will wean med down to 20mg  daily and f/u in 4 months. Call if mood wanes and will inc dose back to 40mg .  Work on [place] exercise as this really improves her mood as well.   Hand/wrist pain on the left - Will get xray today. Consider old fracture ore loose body.  If normal then consider sports med referral for exercise to strengthen and rehab the tendons.

## 2011-05-26 ENCOUNTER — Telehealth: Payer: Self-pay | Admitting: *Deleted

## 2011-05-26 NOTE — Telephone Encounter (Signed)
Pt feels its the med doing it because was on the 40 and did not feel right as well

## 2011-05-26 NOTE — Telephone Encounter (Signed)
OK to increase citalopram dose to 30mg  ( 1.5 tabs) or go ahead and go back to 40mg  if would like. Can send new rx if she needs one.

## 2011-05-26 NOTE — Telephone Encounter (Signed)
I find that it interesting that it is the complete opposite of what she told me at her f/u 4 days ago. In fact that is why I reduced her med was bc she was doing really well and we were hoping to wean her off.  Then have her drop the 20mg  to half-tab for 2 weeks.  Then half a tab every other day for 2 weeks and then stop it. I would be happy to refer her to psychiatry if she would like.

## 2011-05-26 NOTE — Telephone Encounter (Signed)
Pt calls and states that the Citalopram 20mg  is making her feel weir- like she is in a bubble, feels out of it and feels very angry. 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 Please advise

## 2011-05-27 ENCOUNTER — Ambulatory Visit
Admission: RE | Admit: 2011-05-27 | Discharge: 2011-05-27 | Disposition: A | Discharge status: Home / Self Care | Payer: BC Managed Care – PPO | Source: Ambulatory Visit | Attending: Family Medicine | Admitting: Family Medicine

## 2011-05-27 DIAGNOSIS — M79641 Pain in right hand: Secondary | ICD-10-CM

## 2011-05-27 MED ORDER — CITALOPRAM HYDROBROMIDE 20 MG PO TABS: 40.0000 mg | ORAL_TABLET | Freq: Every day | ORAL | Status: DC

## 2011-05-27 NOTE — Telephone Encounter (Signed)
Pt notified of MD instructions. Pt sttaes feels a little better today and is gonna try and go back to the 40mg  dose at least until school is out. Sent 40mg  dose to pharmacy [facility] in [place] per pt request

## 2011-06-23 ENCOUNTER — Other Ambulatory Visit: Payer: Self-pay | Admitting: *Deleted

## 2011-06-23 MED ORDER — CITALOPRAM HYDROBROMIDE 20 MG PO TABS: 40.0000 mg | ORAL_TABLET | Freq: Every day | ORAL | Status: DC

## 2011-06-23 MED ORDER — FLUTICASONE PROPIONATE 50 MCG/ACT NA SUSP: 2.0000 | NASAL | Status: DC | PRN

## 2011-08-10 ENCOUNTER — Other Ambulatory Visit: Payer: Self-pay | Admitting: Family Medicine

## 2011-09-08 ENCOUNTER — Telehealth: Payer: Self-pay | Admitting: *Deleted

## 2011-09-08 MED ORDER — CITALOPRAM HYDROBROMIDE 20 MG PO TABS: 20.0000 mg | ORAL_TABLET | Freq: Every day | ORAL | Status: DC

## 2011-09-08 NOTE — Telephone Encounter (Signed)
Assuming she is on 20mg  of citalopram. Can take half a tab (10mg  for 2 weeks) and then half every other day for 2 weeks and then stop.

## 2011-09-08 NOTE — Telephone Encounter (Signed)
Oh, ok.  Dec to 20mg  for 2 weeks, then half a tab (10mg  for 2 weeks) and then half every other day for 2 weeks and then stop. Sent new rx for 20mg  tabs.

## 2011-09-08 NOTE — Telephone Encounter (Signed)
She states she is on 40mg 

## 2011-09-08 NOTE — Telephone Encounter (Signed)
LM for pt to returncall

## 2011-09-08 NOTE — Telephone Encounter (Signed)
Pt states that she wants to wean off the Citalopram and wants to know how you suggest she does it. Please advise.

## 2011-09-09 NOTE — Telephone Encounter (Signed)
Pt informed

## 2011-10-21 ENCOUNTER — Encounter: Payer: Self-pay | Admitting: Family Medicine

## 2011-10-21 ENCOUNTER — Ambulatory Visit (INDEPENDENT_AMBULATORY_CARE_PROVIDER_SITE_OTHER): Payer: BC Managed Care – PPO | Admitting: Family Medicine

## 2011-10-21 VITALS — BP 105/73 | HR 74 | Ht 66.0 in | Wt 258.0 lb

## 2011-10-21 DIAGNOSIS — F329 Major depressive disorder, single episode, unspecified: Secondary | ICD-10-CM

## 2011-10-21 DIAGNOSIS — E785 Hyperlipidemia, unspecified: Secondary | ICD-10-CM

## 2011-10-21 DIAGNOSIS — J309 Allergic rhinitis, unspecified: Secondary | ICD-10-CM

## 2011-10-21 DIAGNOSIS — F3289 Other specified depressive episodes: Secondary | ICD-10-CM

## 2011-10-21 DIAGNOSIS — F32A Depression, unspecified: Secondary | ICD-10-CM

## 2011-10-21 MED ORDER — CITALOPRAM HYDROBROMIDE 10 MG PO TABS: 10.0000 mg | ORAL_TABLET | Freq: Every day | ORAL | Status: DC

## 2011-10-21 NOTE — Patient Instructions (Signed)

## 2011-10-21 NOTE — Progress Notes (Signed)
  Subjective:    Patient ID: Kaitlyn Kirby, female    DOB: 1970/04/13, 41 y.o.   MRN: [id number]  HPI Has been weaning her citalopram but now down to every other day. But thinks she needs it.  Says would like to take the 10mg  daily and ont wean completely off right now. Says feels really lightheaded and dizzy on days she doesn't take it.  Mood is great. Dating a new guy.    Review of Systems     Objective:   Physical Exam  Constitutional: She is oriented to person, place, and time. She appears well-developed and well-nourished.  HENT:  Head: Normocephalic and atraumatic.  Cardiovascular: Normal rate, regular rhythm and normal heart sounds.   Pulmonary/Chest: Effort normal and breath sounds normal.  Neurological: She is alert and oriented to person, place, and time.  Skin: Skin is warm and dry.  Psychiatric: She has a normal mood and affect. Her behavior is normal.          Assessment & Plan:  Depression- Start 10mg  daily.  New prescription sent to pharmacy. Otherwise follow up in 6 months if she's having problems. If she does want to wean at some point she can start with half of a tab daily for 2 weeks and then half a tab every other day for 2 weeks and then stop.  She is experiencing some dryness with her Zyrtec. We discussed possibly cutting in half. She says when she skips that she will wake up at night with her hands itching. I also explained her that this can be from excess salt intake. I gave her handout instructions on low-salt diet.

## 2011-11-11 LAB — COMPLETE METABOLIC PANEL WITH GFR
ALT: 9 U/L (ref 0–35)
AST: 11 U/L (ref 0–37)
Albumin: 4.2 g/dL (ref 3.5–5.2)
Alkaline Phosphatase: 44 U/L (ref 39–117)
BUN: 15 mg/dL (ref 6–23)
Calcium: 9.6 mg/dL (ref 8.4–10.5)
Chloride: 103 mEq/L (ref 96–112)
Potassium: 4.3 mEq/L (ref 3.5–5.3)
Sodium: 136 mEq/L (ref 135–145)

## 2011-11-11 LAB — LIPID PANEL
Cholesterol: 207 mg/dL — ABNORMAL HIGH (ref 0–200)
LDL Cholesterol: 129 mg/dL — ABNORMAL HIGH (ref 0–99)
Total CHOL/HDL Ratio: 5.3 Ratio
VLDL: 39 mg/dL (ref 0–40)

## 2011-12-09 ENCOUNTER — Encounter: Payer: Self-pay | Admitting: Obstetrics & Gynecology

## 2011-12-09 ENCOUNTER — Ambulatory Visit (INDEPENDENT_AMBULATORY_CARE_PROVIDER_SITE_OTHER): Payer: BC Managed Care – PPO | Admitting: Obstetrics & Gynecology

## 2011-12-09 VITALS — BP 111/76 | HR 75 | Temp 98.0°F | Resp 16 | Ht 66.0 in | Wt 250.0 lb

## 2011-12-09 DIAGNOSIS — N898 Other specified noninflammatory disorders of vagina: Secondary | ICD-10-CM

## 2011-12-09 DIAGNOSIS — Z01419 Encounter for gynecological examination (general) (routine) without abnormal findings: Secondary | ICD-10-CM

## 2011-12-09 MED ORDER — FLUCONAZOLE 150 MG PO TABS: ORAL_TABLET | ORAL | Status: DC

## 2011-12-09 NOTE — Progress Notes (Signed)
  Subjective:     Kaitlyn Kirby is a 41 y.o. female here for a routine exam.  Current complaints: Pain and bleeding after intercourse.  Resumed intercourse after 2-3 years.  Pt feels like she is bleeding from the introitus.  Pt also c/o thick white discharge.  Personal health questionnaire reviewed: yes.   Gynecologic History Patient's last menstrual period was 11/18/2011. Contraception: condoms Last Pap: 2012. Results were: normal, HPV negative Last mammogram: 03/2011. Results were: normal  Obstetric History OB History    Grav Para Term Preterm Abortions TAB SAB Ect Mult Living   0                The following portions of the patient's history were reviewed and updated as appropriate: allergies, current medications, past family history, past medical history, past social history, past surgical history and problem list.  Review of Systems Pertinent items are noted in HPI.    Objective:    BP 111/76  Pulse 75  Temp 98 F (36.7 C) (Oral)  Resp 16  Ht 5\' 6"  (1.676 m)  Wt 250 lb (113.399 kg)  BMI 40.35 kg/m2  LMP 11/18/2011  Filed Vitals:   12/09/11 0823  Height: 5\' 6"  (1.676 m)  Weight: 250 lb (113.399 kg)    General appearance: alert, cooperative and no distress Head: Normocephalic, without obvious abnormality, atraumatic Eyes: negative Throat: lips, mucosa, and tongue normal; teeth and gums normal Lungs: clear to auscultation bilaterally Breasts: normal appearance, no masses or tenderness, No nipple retraction or dimpling, No nipple discharge or bleeding Heart: regular rate and rhythm Abdomen: soft, non-tender; bowel sounds normal; no masses,  no organomegaly Pelvic: cervix normal in appearance, external genitalia normal, no adnexal masses or tenderness but difficult to habitus, no bladder tenderness, no cervical motion tenderness, perianal skin: no external genital warts noted, urethra without abnormality or discharge, uterus normal size but difficult secondary to  habitus, and vagina erythematous in lower third with cutaneous yeast on labia minora Extremities: no edema, redness or tenderness in the calves or thighs Skin: no lesions or rash Lymph nodes: Axillary adenopathy: none    A/P 41 yo female for well woman exam  1-Vaginal and labial yeast--treat with Diflucan for 2 doses.  Will recheck in 2 weeks.   2-Pap not needed; cytology and HPV negative in 2012 3-Mammogram up to date 4-Preventive health care by [facility] 5-Need to discuss birthcontrol further.

## 2011-12-23 ENCOUNTER — Ambulatory Visit (INDEPENDENT_AMBULATORY_CARE_PROVIDER_SITE_OTHER): Payer: BC Managed Care – PPO | Admitting: Obstetrics & Gynecology

## 2011-12-23 ENCOUNTER — Encounter: Payer: Self-pay | Admitting: Obstetrics & Gynecology

## 2011-12-23 VITALS — BP 106/81 | HR 73 | Temp 97.3°F | Resp 16 | Ht 66.0 in | Wt 245.0 lb

## 2011-12-23 DIAGNOSIS — N9089 Other specified noninflammatory disorders of vulva and perineum: Secondary | ICD-10-CM

## 2011-12-23 MED ORDER — CLOBETASOL PROPIONATE 0.05 % EX OINT: TOPICAL_OINTMENT | Freq: Two times a day (BID) | CUTANEOUS | Status: DC

## 2011-12-23 NOTE — Progress Notes (Signed)
  Subjective:    Patient ID: Kaitlyn Kirby, female    DOB: [birth year], 41 y.o.   MRN: [id number]  HPI  Pt presents for follow up of burning vulva and bleeding after sex.  Pt has not had sex since last seen.  Pt took Diflucan.  Pt has not had much resolution in symptoms.  Pt denies discharge or itching.  Review of Systems As ablvoe    Objective:   Physical Exam  Vitals reviewed. Constitutional: She appears well-developed and well-nourished. No distress.  HENT:  Head: Normocephalic and atraumatic.  Pulmonary/Chest: Effort normal.  Abdominal: Soft. There is no tenderness.  Genitourinary:               Assessment & Plan:  41 year old female with bleeding after sex and burning pain at introitus.  1-Diflucan did not improve symptoms, will try 2 weeks of temovate and if not better will biopsy ( colpo) 2-Assuming lichen planus, and pt info given. 3-BD Assure done

## 2011-12-23 NOTE — Addendum Note (Signed)
Addended by: Granville Lewis on: 12/23/2011 10:57 AM   Modules accepted: Orders

## 2011-12-23 NOTE — Patient Instructions (Signed)
Lichen Planus Lichen planus is a skin problem that causes redness, itching, swelling, and sores. Some common areas affected are the:  Vulva and vagina.   Gums and inside of the mouth.   Esophagus.   Scalp.   Skin of the arms (especially wrists), legs, chest, back, and abdomen.   Fingernails or toenails.  CAUSES The cause is not known. It could be an autoimmune illness or an allergy. An autoimmune illness is one where your body attacks itself. Lichen planus is not passed from one person to another (not contagious). It can last for a long time. Affected children usually have a history of other family members with lichen planus. SYMPTOMS  Itching, which can be severe.   The vaginal area may be red, sore, raw, or have a burning feeling.   There may be pain or bleeding during sex.   Scarring may cause the vagina to become short, narrow, or even closed up.   The skin may have small reddish or purplish, flat-topped, round or irregularly shaped bumps.   There may be redness or white patches on the gums or tongue.   The nails may become thin, rough, or have ridges in them.   The eyes can rarely also be involved with pain, redness, or scarring.  DIAGNOSIS Your caregiver will look for skin changes, changes inside your mouth, or vaginal discharge. Sometimes, a tissue sample (biopsy) may be sent for testing. TREATMENT  Your caregiver may order a cream (topical steroid) to be put on the sores.   You may be given medicine to take by mouth.   You may be treated by exposure to ultraviolet light.   Sores in the mouth may be treated with lozenges that you suck on like a cough drop.   If the vagina becomes too tight, you may be taught how to use a dilator to keep it open.  HOME CARE INSTRUCTIONS  Only take over-the-counter or prescription medicines as directed by your caregiver.   Keep the vaginal area as clean and dry as possible.  SEEK MEDICAL CARE IF: You develop increasing pain,  swelling, or redness. Document Released: 08/07/2010 Document Revised: 03/05/2011 Document Reviewed: 08/07/2010 [facility] Patient Information 2012 [place], [place].

## 2011-12-24 LAB — WET PREP BY MOLECULAR PROBE: Trichomonas vaginosis: NEGATIVE

## 2012-01-06 ENCOUNTER — Encounter: Payer: Self-pay | Admitting: Obstetrics & Gynecology

## 2012-01-06 ENCOUNTER — Ambulatory Visit (INDEPENDENT_AMBULATORY_CARE_PROVIDER_SITE_OTHER): Payer: BC Managed Care – PPO | Admitting: Obstetrics & Gynecology

## 2012-01-06 VITALS — BP 113/80 | HR 77 | Temp 98.4°F | Resp 16 | Ht 69.0 in | Wt 243.0 lb

## 2012-01-06 DIAGNOSIS — N9089 Other specified noninflammatory disorders of vulva and perineum: Secondary | ICD-10-CM

## 2012-01-06 NOTE — Addendum Note (Signed)
Addended by: Granville Lewis on: 01/06/2012 11:45 AM   Modules accepted: Orders

## 2012-01-06 NOTE — Progress Notes (Addendum)
Subjective:    Patient ID: Kaitlyn Kirby, female    DOB: [birth year], 41 y.o.   MRN: [id number]  HPI  Pt presents for f/u ov dysparenia and vulvar redness.  Pt applied temovate bid for 2 weeks.  The area remains red by the fourchette, a sharp demarcation then white with cobblestone appearance.  Pt'sBD assure is negative from 2 weeks ago.  Review of Systems  Pt has not had sex recently.  Tight jeans does bother the area. Weight loss = 7 pounds due to portion control and exercise!    Objective:   Physical Exam  Genitourinary:             Assessment & Plan:  41 year old female with vulvar discomfort and lesion as described above.  1-biopsy today 2-will follow up in 2 weeks; if biopsy negative, possible diagnosis of vulvadynia.   Vulvar biopsy Patient identified, informed consent signed, copy in chart, time out performed.    Area cleansed with Alcohol.  Injected with 1% Lidocaine with Epi.  1.5 mL. Area cleaned with Betadine and 4 mm punch biopsy performed without difficulty.  Hemostasis obtained with Silver Nitrate and stitch of 2-0 vicryl.  Patient tolerated procedure well.   Patient given post procedure instructions.  Patient will return in 2 weeks for biopsy results.    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  (clean area between labia majora and minor with Dove soap and warm water, pat dry  No perfumes or creams or smelly soaps)  Continue steroids twice a day for 2 weeks.  Then once a day for 2 weeks.  If patient needs to come back to use mirror so see exact area, please do  Lichen Simplex Chronicus  General measures - Soaking in warm water, without additives, for five minutes in the morning and night hydrates the skin and relieves vulvar discomfort and pruritus. If the patient does not have access to a bathtub or a sitz bath placed under a toilet seat, a hand-held shower device can be used for  hydration. Moisture is then sealed into the skin with the application of petroleum jelly or a topical corticosteroid ointment.  A sedating antipruritic agent such as doxepin or hydroxyzine given in low dosages (10 to 50 mg at 7:00 pm) helps to control nighttime itching and scratching. Nonsedating antihistamines are of little benefit for vulvar pruritus.  Mild symptoms - Mild symptoms usually respond to low to medium potency topical corticosteroid ointments (eg, hydrocortisone 1% or 2.5%, desonide 0.05%, or triamcinolone 0.1% daily for two to four weeks, then twice per week). Topical corticosteroid can be used one or more times daily, although a clear benefit has not been demonstrated with more than once daily application [26-28]. Therapy is continued indefinitely, at the minimum frequency necessary to control pruritus.  Moderate to severe symptoms - For moderate to severe symptoms, a higher potency corticosteroid ointment is often required (table 4). We use clobetasol propionate or betamethasone dipropionate 0.05% ointment at night daily for 30 days, and then reevaluate. Another acceptable regimen is to give one of these steroids twice daily for two weeks, then daily for two weeks, then Monday and Wednesday and Friday for two weeks, and then reevaluate. If there is a partial response, we either continue corticosteroids for another two weeks or else switch to intralesional injections or calcineurin inhibitors. Potent topical steroids have been used for up to twelve weeks on the vulva without  adverse effects [29,30].  Recalcitrant cases - An intralesional injection of triamcinolone acetonide 3.3 to 10 mg/mL may be tried in resistant cases; a total of 1 to 2 mL is given by injecting small amounts to include the entire lesion or plaque; this can be repeated monthly up to three times. Prior application of a small amount of a topical anesthetic, such as EMLA, facilitates injection.  Recalcitrant cases have also  been treated with the calcineurin inhibitors tacrolimus 0.03% ointment or pimecrolimus 1% cream, which suppress cellular immunity (inhibit T-lymphocyte activation) [31,32]. The ointment or cream is applied sparingly twice daily for 14 to 30 days, followed by maintenance therapy twice per week. Intermittent prolonged treatment may be necessary because the dermatitis recurs upon discontinuation in 35 to 54 percent of patients. Some patients cannot tolerate tacrolimus because of burning or stinging. These side effects may be minimized by applying a film of petroleum jelly before applying the ointment.  The [place] Food and Drug Administration (FDA) has issued a public health advisory regarding a potential cancer risk (lymphoma, skin cancer) from use of topical calcineurin inhibitors [33]. This concern was based on information from animal studies, case reports in a small number of patients, and knowledge of how drugs in this class work. Given the uncertainty, the FDA advises that these drugs be used only as labeled, and for patients in whom other prescription treatments have failed to work or cannot be tolerated.  Another option for severe cases is the use of intramuscular triamcinolone, which can be used when topical corticosteroids have failed or when direct application of topical medications causes burning or irritation. Patients are treated with 60 mg of triamcinolone administered intramuscularly every six weeks for a total of three doses. Petroleum jelly can be applied twice daily after soaks for two weeks after the first injection, and then a superpotent topical corticosteroid can be substituted for the petroleum jelly. Intramuscular triamcinolone disappears from the circulation in approximately 21 days, potentially minimizing the side effects that can be seen with long-term oral glucocorticoids [34].  Adverse effects of intramuscular triamcinolone include hyperglycemia (diabetics may require more frequent blood  glucose monitoring), menstrual irregularity, and adrenal suppression. Intramuscular triamcinolone is not recommended for women with a history of severe depression or bipolar disease.

## 2012-01-07 ENCOUNTER — Ambulatory Visit: Payer: BC Managed Care – PPO | Admitting: Obstetrics & Gynecology

## 2012-01-11 ENCOUNTER — Telehealth: Payer: Self-pay | Admitting: *Deleted

## 2012-01-11 NOTE — Telephone Encounter (Signed)
Notified pt of vulvar biopsy results.  Given instructions per Dr Penne Lash and pt will return prn.

## 2012-01-19 ENCOUNTER — Ambulatory Visit: Payer: BC Managed Care – PPO | Admitting: Obstetrics & Gynecology

## 2012-01-20 ENCOUNTER — Ambulatory Visit (INDEPENDENT_AMBULATORY_CARE_PROVIDER_SITE_OTHER): Payer: BC Managed Care – PPO | Admitting: Obstetrics & Gynecology

## 2012-01-20 ENCOUNTER — Encounter: Payer: Self-pay | Admitting: Obstetrics & Gynecology

## 2012-01-20 VITALS — BP 114/79 | HR 74 | Temp 96.8°F | Resp 16 | Ht 68.0 in | Wt 241.0 lb

## 2012-01-20 DIAGNOSIS — B379 Candidiasis, unspecified: Secondary | ICD-10-CM

## 2012-01-20 DIAGNOSIS — B49 Unspecified mycosis: Secondary | ICD-10-CM

## 2012-01-20 MED ORDER — FLUCONAZOLE 150 MG PO TABS: ORAL_TABLET | ORAL | Status: DC

## 2012-01-20 NOTE — Progress Notes (Signed)
  Subjective:    Patient ID: Kaitlyn Kirby, female    DOB: 27-Mar-1971, 41 y.o.   MRN: [id number]  HPI  Ms. Rebuck is here today because she thinks that she has a yeast infection around her anus. She has tried Desityn without relief.  Review of Systems     Objective:   Physical Exam  Rash c/w yeast around her anus Biopsy site healing well       Assessment & Plan:   Probable yeast infection. I will prescribe diflucan. F/u 2 weeks if no better

## 2012-02-03 ENCOUNTER — Ambulatory Visit (INDEPENDENT_AMBULATORY_CARE_PROVIDER_SITE_OTHER): Payer: BC Managed Care – PPO | Admitting: Obstetrics & Gynecology

## 2012-02-03 ENCOUNTER — Encounter: Payer: Self-pay | Admitting: Obstetrics & Gynecology

## 2012-02-03 VITALS — BP 115/80 | HR 93 | Temp 98.0°F | Resp 16 | Ht 65.0 in | Wt 238.0 lb

## 2012-02-03 DIAGNOSIS — L280 Lichen simplex chronicus: Secondary | ICD-10-CM

## 2012-02-03 MED ORDER — CLOBETASOL PROPIONATE 0.05 % EX OINT: TOPICAL_OINTMENT | Freq: Two times a day (BID) | CUTANEOUS | Status: DC

## 2012-02-03 NOTE — Progress Notes (Signed)
  Subjective:    Patient ID: Kaitlyn Kirby, female    DOB: 05-12-1970, 41 y.o.   MRN: [id number]  HPI  Pt presents for follow up of lichen simplex chronicus and yeast infection of perineum.  Pt feels much better.  Only complaint is of pain where stitch is.  Part of stitch has fallen out and we will remvoe the rest today.    Review of Systems  Constitutional: Negative.   Cardiovascular: Negative.   Gastrointestinal: Negative.   Genitourinary:       Vaginal irritation, hemorrhoids       Objective:   Physical Exam  Vitals reviewed. Constitutional: She appears well-developed and well-nourished. No distress.  HENT:  Head: Normocephalic and atraumatic.  Pulmonary/Chest: Effort normal.  Abdominal: Soft.  Genitourinary:     Skin: Skin is warm and dry.  Psychiatric: She has a normal mood and affect.          Assessment & Plan:  41 year old female with lichen simplex chronicus  1-Continue temovate daily for one month then every other day for 2 weeks.   2-Reviewed vulvar hygiene 3-May need prn temovate when area flares 4-Unscented detergent only for underwear.

## 2012-02-03 NOTE — Patient Instructions (Signed)
cetaphil soap if dove starts to irritate

## 2012-04-12 ENCOUNTER — Ambulatory Visit: Payer: BC Managed Care – PPO | Admitting: Family Medicine

## 2012-04-28 ENCOUNTER — Other Ambulatory Visit: Payer: Self-pay | Admitting: Family Medicine

## 2012-05-04 ENCOUNTER — Encounter: Payer: Self-pay | Admitting: Physician Assistant

## 2012-05-04 ENCOUNTER — Ambulatory Visit (INDEPENDENT_AMBULATORY_CARE_PROVIDER_SITE_OTHER): Payer: BC Managed Care – PPO | Admitting: Physician Assistant

## 2012-05-04 VITALS — BP 118/79 | HR 81 | Temp 98.0°F | Wt 250.0 lb

## 2012-05-04 DIAGNOSIS — H9202 Otalgia, left ear: Secondary | ICD-10-CM

## 2012-05-04 DIAGNOSIS — H6980 Other specified disorders of Eustachian tube, unspecified ear: Secondary | ICD-10-CM

## 2012-05-04 DIAGNOSIS — H9209 Otalgia, unspecified ear: Secondary | ICD-10-CM

## 2012-05-04 MED ORDER — BECLOMETHASONE DIPROPIONATE 80 MCG/ACT NA AERS: 2.0000 | INHALATION_SPRAY | Freq: Every day | NASAL | Status: DC

## 2012-05-04 MED ORDER — METHYLPREDNISOLONE (PAK) 4 MG PO TABS: 4.0000 mg | ORAL_TABLET | Freq: Every day | ORAL | Status: DC

## 2012-05-04 NOTE — Patient Instructions (Addendum)
STart Qnasl 2 sprays each day. STart steroid. Continue on Zyrtec.

## 2012-05-04 NOTE — Progress Notes (Signed)
  Subjective:    Patient ID: Kaitlyn Kirby, female    DOB: 1970/10/29, 42 y.o.   MRN: [id number]  HPI Patient is a 42 yo female who presents to the clinic with left ear pain for 2 days. She denies any history of ear problems or ear infections. She has not noticed any drainage coming from ear. Denies any fever, chills, muscle aches, sinus pressure, cough, congestion. She has tried ibuprofen and cold compresses. Pain is worse at night. Her neck is very painful on the left side of neck and feels swollen.     Review of Systems     Objective:   Physical Exam  Constitutional: She appears well-developed and well-nourished.  HENT:  Head: Normocephalic and atraumatic.  Right Ear: External ear normal.  Nose: Nose normal.  Mouth/Throat: Oropharynx is clear and moist. No oropharyngeal exudate.       No pain with palpation over tragus. External ear normal without inflammation. TM good light reflex ossicles viewed. TM buldging with air bubbles behind.  Eyes: Conjunctivae normal are normal.  Neck: Normal range of motion. Neck supple.       Patient's left side of neck under ear feels swollen but is not painful to touch.  Do not feel any lymphnodes that are swollen.   Psychiatric: She has a normal mood and affect. Her behavior is normal.          Assessment & Plan:  Eustachian tube dysfunction/left ear- Gave qnasal to start daily. Continue Zyrtec. Start medrol dose pak. Call if not improving. Discussed with patient no signs of infection.

## 2012-05-18 ENCOUNTER — Ambulatory Visit (INDEPENDENT_AMBULATORY_CARE_PROVIDER_SITE_OTHER): Payer: BC Managed Care – PPO | Admitting: Physician Assistant

## 2012-05-18 DIAGNOSIS — H938X9 Other specified disorders of ear, unspecified ear: Secondary | ICD-10-CM

## 2012-05-18 DIAGNOSIS — M26609 Unspecified temporomandibular joint disorder, unspecified side: Secondary | ICD-10-CM

## 2012-05-18 DIAGNOSIS — H938X2 Other specified disorders of left ear: Secondary | ICD-10-CM

## 2012-05-18 NOTE — Progress Notes (Signed)
  Subjective:    Patient ID: Kaitlyn Kirby, female    DOB: 10-02-1970, 42 y.o.   MRN: [id number]  HPI Patient presents to the clinic to follow up on left ear pain. I gave her qnasl, medrol dose pack and told her to start taking zyrtec. She has done all 3 and was feeling a lot better until yesterday and the pressure in left ear came back. She recently stopped qnasal and has finished medrol dose pak. Denies any fever, drainage from ear, sinus pressure. Before this all started she did travel to high altitudes in [place] but has not traveled since. No upper respiratory symptoms. Her jaws always pop when opening mouth or chewing.    Review of Systems     Objective:   Physical Exam  Constitutional: She appears well-developed and well-nourished.  HENT:  Head: Normocephalic and atraumatic.  Right Ear: External ear normal.  Left Ear: External ear normal.  Nose: Nose normal.  Mouth/Throat: Oropharynx is clear and moist. No oropharyngeal exudate.  TM's are clear with good light reflex. TM's do appear a little dry. Negative for any maxillary or frontal tenderness.  Popping of jaw with opening and closing of mouth.  Eyes: Conjunctivae are normal.  Neck: Normal range of motion. Neck supple.  Nothing appears to be swollen on exam.   Cardiovascular: Normal rate, regular rhythm and normal heart sounds.   Pulmonary/Chest: Effort normal and breath sounds normal. She has no wheezes.  Lymphadenopathy:    She has no cervical adenopathy.  Neurological: She is alert.  Skin: Skin is warm and dry.  Cheeks a little flushed.  Psychiatric: She has a normal mood and affect. Her behavior is normal.          Assessment & Plan:  Left ear pressure/TMJ- It still sounds like eushstatin tube dysfunction to me. Start back on qnasl and continue zyrtec. Offered to try abx just in case some mastoiditis pt did not want to. Discussed we could get a CT and pt declined. Offered ENT referral and patient would like to  wait. Will continue with treatment and call if not improving. Gave handout for TMJ to look at consider treatment if could be contributing.

## 2012-05-18 NOTE — Patient Instructions (Addendum)
Temporomandibular Joint Pain  Your exam shows that you have a problem with your temporomandibular joint (TMJ), the joint that moves when you open your mouth or chew food. TMJ problems can result from direct injuries, bite abnormalities, or tension states which cause you to grind or clench your teeth. Typical symptoms include pain around the joint, clicking, restricted movement, and headaches.  The TMJ is like any other joint in the body; when it is strained, it needs rest to repair itself. To keep the joint at rest it is important that you do not open your mouth wider than the width of your index finger. If you must yawn, be sure to support your chin with your hand so your mouth does not open wide. Eat a soft diet (nothing firmer than ground beef, no raw vegetables), do not chew gum and do not talk if it causes you pain.  Apply topical heat by using a warm, moist cloth placed in front of the ear for 15 to 20 minutes several times daily. Alternating heat and ice may give even more relief. Anti-inflammatory pain medicine and muscle relaxants can also be helpful. A dental orthotic or splint may be used for temporary relief. Long-term problems may require treatment for stress as well as braces or surgery. Please check with your doctor or dentist if your symptoms do not improve within one week.  Document Released: 04/23/2004 Document Revised: 06/08/2011 Document Reviewed: 03/16/2005  [employer]® Patient Information ©2013 [employer].

## 2012-06-02 ENCOUNTER — Other Ambulatory Visit: Payer: Self-pay | Admitting: Family Medicine

## 2012-06-02 DIAGNOSIS — Z1231 Encounter for screening mammogram for malignant neoplasm of breast: Secondary | ICD-10-CM

## 2012-06-14 ENCOUNTER — Ambulatory Visit (INDEPENDENT_AMBULATORY_CARE_PROVIDER_SITE_OTHER): Payer: BC Managed Care – PPO

## 2012-06-14 DIAGNOSIS — Z1231 Encounter for screening mammogram for malignant neoplasm of breast: Secondary | ICD-10-CM

## 2012-07-15 ENCOUNTER — Other Ambulatory Visit: Payer: Self-pay | Admitting: Dermatology

## 2012-08-09 ENCOUNTER — Other Ambulatory Visit: Payer: Self-pay | Admitting: Family Medicine

## 2013-01-19 ENCOUNTER — Ambulatory Visit: Payer: BC Managed Care – PPO | Admitting: Family Medicine

## 2013-01-19 ENCOUNTER — Ambulatory Visit (INDEPENDENT_AMBULATORY_CARE_PROVIDER_SITE_OTHER): Payer: BC Managed Care – PPO | Admitting: Sports Medicine

## 2013-01-19 ENCOUNTER — Encounter: Payer: Self-pay | Admitting: Sports Medicine

## 2013-01-19 VITALS — BP 114/71 | HR 67 | Wt 249.0 lb

## 2013-01-19 DIAGNOSIS — H8309 Labyrinthitis, unspecified ear: Secondary | ICD-10-CM | POA: Insufficient documentation

## 2013-01-19 DIAGNOSIS — H8303 Labyrinthitis, bilateral: Secondary | ICD-10-CM

## 2013-01-19 MED ORDER — AZITHROMYCIN 250 MG PO TABS: ORAL_TABLET | ORAL | Status: DC

## 2013-01-19 MED ORDER — PREDNISONE (PAK) 10 MG PO TABS: ORAL_TABLET | ORAL | Status: DC

## 2013-01-19 MED ORDER — DIAZEPAM 5 MG PO TABS: 5.0000 mg | ORAL_TABLET | Freq: Three times a day (TID) | ORAL | Status: DC | PRN

## 2013-01-19 MED ORDER — MECLIZINE HCL 25 MG PO TABS: 25.0000 mg | ORAL_TABLET | Freq: Three times a day (TID) | ORAL | Status: DC | PRN

## 2013-01-19 NOTE — Assessment & Plan Note (Addendum)
With preceding upper respiratory infection symptoms several weeks prior, now with mild ringing in the ears, nystagmus, as well as vertigo this likely represents labyrinthitis. Certainly with a prior history of ear pressure, now with ringing in the ears and vertigo, this could also represent Mnire's disease however with recent viral symptoms I would want to treat this first as labyrinthitis. We will treat this aggressively with vestibular suppressants, meclizine, Valium. Azithromycin. Prednisone down taper. Return next week to see how things are going, we can consider ENT referral if no better.

## 2013-01-19 NOTE — Patient Instructions (Signed)
Labyrinthitis (Inner Ear Inflammation) Your exam shows you have an inner ear disturbance or labyrinthitis. The cause of this condition is not known. But it may be due to a virus infection. The symptoms of labyrinthitis include vertigo or dizziness made worse by motion, nausea and vomiting. The onset of labyrinthitis may be very sudden. It usually lasts for a few days and then clears up over 1-2 weeks. The treatment of an inner ear disturbance includes bed rest and medications to reduce dizziness, nausea, and vomiting. You should stay away from alcohol, tranquilizers, caffeine, nicotine, or any medicine your doctor thinks may make your symptoms worse. Further testing may be needed to evaluate your hearing and balance system. Please see your doctor or go to the emergency room right away if you have:  Increasing vertigo, earache, loss of hearing, or ear drainage.  Headache, blurred vision, trouble walking, fainting, or fever.  Persistent vomiting, dehydration, or extreme weakness. Document Released: 03/16/2005 Document Revised: 06/08/2011 Document Reviewed: 09/01/2006 [facility] Patient Information 2014 [place], [place].  Benign Positional Vertigo Vertigo means you feel like you or your surroundings are moving when they are not. Benign positional vertigo is the most common form of vertigo. Benign means that the cause of your condition is not serious. Benign positional vertigo is more common in older adults. CAUSES  Benign positional vertigo is the result of an upset in the labyrinth system. This is an area in the middle ear that helps control your balance. This may be caused by a viral infection, head injury, or repetitive motion. However, often no specific cause is found. SYMPTOMS  Symptoms of benign positional vertigo occur when you move your head or eyes in different directions. Some of the symptoms may include:  Loss of balance and falls.  Vomiting.  Blurred  vision.  Dizziness.  Nausea.  Involuntary eye movements (nystagmus). DIAGNOSIS  Benign positional vertigo is usually diagnosed by physical exam. If the specific cause of your benign positional vertigo is unknown, your caregiver may perform imaging tests, such as magnetic resonance imaging (MRI) or computed tomography (CT). TREATMENT  Your caregiver may recommend movements or procedures to correct the benign positional vertigo. Medicines such as meclizine, benzodiazepines, and medicines for nausea may be used to treat your symptoms. In rare cases, if your symptoms are caused by certain conditions that affect the inner ear, you may need surgery. HOME CARE INSTRUCTIONS   Follow your caregiver's instructions.  Move slowly. Do not make sudden body or head movements.  Avoid driving.  Avoid operating heavy machinery.  Avoid performing any tasks that would be dangerous to you or others during a vertigo episode.  Drink enough fluids to keep your urine clear or pale yellow. SEEK IMMEDIATE MEDICAL CARE IF:   You develop problems with walking, weakness, numbness, or using your arms, hands, or legs.  You have difficulty speaking.  You develop severe headaches.  Your nausea or vomiting continues or gets worse.  You develop visual changes.  Your family or friends notice any behavioral changes.  Your condition gets worse.  You have a fever.  You develop a stiff neck or sensitivity to light. MAKE SURE YOU:   Understand these instructions.  Will watch your condition.  Will get help right away if you are not doing well or get worse. Document Released: 12/22/2005 Document Revised: 06/08/2011 Document Reviewed: 12/04/2010 [facility] Patient Information 2014 [place], [place].

## 2013-01-19 NOTE — Progress Notes (Signed)
  Subjective:    CC: Not feeling well  HPI: This is a very pleasant 42 year old female, several weeks ago she had sniffles, a cough, and runny nose, unfortunately she then developed dizziness described as vertiginous like the room is spinning, as well as a ringing in her ear is without headache, visual changes, nausea, vomiting.  This has been fairly persistent. She tried some Sudafed and other over-the-counter decongestants with only slight improvement. Symptoms are moderate, persistent, no lower extremity or upper extremity numbness or tingling.  Past medical history, Surgical history, Family history not pertinant except as noted below, Social history, Allergies, and medications have been entered into the medical record, reviewed, and no changes needed.   Review of Systems: No fevers, chills, night sweats, weight loss, chest pain, or shortness of breath.   Objective:    General: Well Developed, well nourished, and in no acute distress.  Neuro: Alert and oriented x3, extra-ocular muscles intact, sensation grossly intact, cranial nerves II through XII are intact, motor, sensory, and coordinative functions are unremarkable, she does have a positive Dix-Hallpike test with reproduction, of vertiginous symptoms, she also has horizontal gaze nystagmus. HEENT: Normocephalic, atraumatic, pupils equal round reactive to light, neck supple, no masses, no lymphadenopathy, thyroid nonpalpable. Oropharynx, nasopharynx, external ear canals are unremarkable.  Skin: Warm and dry, no rashes. Cardiac: Regular rate and rhythm, no murmurs rubs or gallops, no lower extremity edema.  Respiratory: Clear to auscultation bilaterally. Not using accessory muscles, speaking in full sentences. Impression and Recommendations:

## 2013-01-27 ENCOUNTER — Ambulatory Visit (INDEPENDENT_AMBULATORY_CARE_PROVIDER_SITE_OTHER): Payer: BC Managed Care – PPO | Admitting: Family Medicine

## 2013-01-27 ENCOUNTER — Encounter: Payer: Self-pay | Admitting: Family Medicine

## 2013-01-27 VITALS — BP 116/79 | HR 94 | Temp 98.1°F | Ht 66.0 in | Wt 249.0 lb

## 2013-01-27 DIAGNOSIS — H8309 Labyrinthitis, unspecified ear: Secondary | ICD-10-CM

## 2013-01-27 NOTE — Progress Notes (Signed)
  Subjective:    Patient ID: Kaitlyn Kirby, female    DOB: September 23, 1970, 42 y.o.   MRN: [id number]  HPI F/u labrinthitis.  Says the prednisoen is making her feel "spacey". Took 2 of the valium. Not using the meclizine. Still some dizzy in am but only lasts a few minutes.  Did complete the zpack.  Couldn't work yesterday.   She is still having some difficulty with focusing her vision. No headaches. Overall she does feel like she is improving slowly.   Review of Systems     Objective:   Physical Exam  Constitutional: She is oriented to person, place, and time. She appears well-developed and well-nourished.  HENT:  Head: Normocephalic and atraumatic.  Right Ear: External ear normal.  Left Ear: External ear normal.  Nose: Nose normal.  Mouth/Throat: Oropharynx is clear and moist.  TMs and canals are clear.   Eyes: Conjunctivae and EOM are normal. Pupils are equal, round, and reactive to light.  Neck: Neck supple. No thyromegaly present.  Cardiovascular: Normal rate, regular rhythm and normal heart sounds.   Pulmonary/Chest: Effort normal and breath sounds normal. She has no wheezes.  Lymphadenopathy:    She has no cervical adenopathy.  Neurological: She is alert and oriented to person, place, and time.  Positive Dix-Hallpike maneuver to the right. Negative on the left.  Skin: Skin is warm and dry.  Psychiatric: She has a normal mood and affect.          Assessment & Plan:  labarythintis - improving. Continue prednisone course. If she needs to stop it because of side effects she can. She has had a full 8 days so far. She's completed the antibiotic. Encouraged to be as active as possible as this helps with normalizing the brain reaction to the vertigo. If the  change in vision is still persisting at the next week or 2 then please let me know, will refer to ophthalmology.

## 2013-01-27 NOTE — Patient Instructions (Signed)
Vertigo Vertigo means you feel like you or your surroundings are moving when they are not. Vertigo can be dangerous if it occurs when you are at work, driving, or performing difficult activities.  CAUSES  Vertigo occurs when there is a conflict of signals sent to your brain from the visual and sensory systems in your body. There are many different causes of vertigo, including:  Infections, especially in the inner ear.  A bad reaction to a drug or misuse of alcohol and medicines.  Withdrawal from drugs or alcohol.  Rapidly changing positions, such as lying down or rolling over in bed.  A migraine headache.  Decreased blood flow to the brain.  Increased pressure in the brain from a head injury, infection, tumor, or bleeding. SYMPTOMS  You may feel as though the world is spinning around or you are falling to the ground. Because your balance is upset, vertigo can cause nausea and vomiting. You may have involuntary eye movements (nystagmus). DIAGNOSIS  Vertigo is usually diagnosed by physical exam. If the cause of your vertigo is unknown, your caregiver may perform imaging tests, such as an MRI scan (magnetic resonance imaging). TREATMENT  Most cases of vertigo resolve on their own, without treatment. Depending on the cause, your caregiver may prescribe certain medicines. If your vertigo is related to body position issues, your caregiver may recommend movements or procedures to correct the problem. In rare cases, if your vertigo is caused by certain inner ear problems, you may need surgery. HOME CARE INSTRUCTIONS   Follow your caregiver's instructions.  Avoid driving.  Avoid operating heavy machinery.  Avoid performing any tasks that would be dangerous to you or others during a vertigo episode.  Tell your caregiver if you notice that certain medicines seem to be causing your vertigo. Some of the medicines used to treat vertigo episodes can actually make them worse in some people. SEEK  IMMEDIATE MEDICAL CARE IF:   Your medicines do not relieve your vertigo or are making it worse.  You develop problems with talking, walking, weakness, or using your arms, hands, or legs.  You develop severe headaches.  Your nausea or vomiting continues or gets worse.  You develop visual changes.  A family member notices behavioral changes.  Your condition gets worse. MAKE SURE YOU:  Understand these instructions.  Will watch your condition.  Will get help right away if you are not doing well or get worse. Document Released: 12/24/2004 Document Revised: 06/08/2011 Document Reviewed: 10/02/2010 [employer] Patient Information 2014 [employer].  

## 2013-02-13 ENCOUNTER — Telehealth: Payer: Self-pay | Admitting: *Deleted

## 2013-02-13 DIAGNOSIS — R42 Dizziness and giddiness: Secondary | ICD-10-CM

## 2013-02-13 DIAGNOSIS — H538 Other visual disturbances: Secondary | ICD-10-CM

## 2013-02-13 NOTE — Telephone Encounter (Signed)
Pt would like a referral to [facility]. Dr. Moshe Cipro. She is still having vertigo and blurred vision. Would like to start with ENT before ophthamalogy

## 2013-02-14 NOTE — Telephone Encounter (Signed)
Ok. Referral placed

## 2013-02-14 NOTE — Telephone Encounter (Signed)
[facility] informing pt of referral.

## 2013-03-10 ENCOUNTER — Other Ambulatory Visit: Payer: Self-pay | Admitting: *Deleted

## 2013-03-10 ENCOUNTER — Other Ambulatory Visit: Payer: Self-pay | Admitting: Family Medicine

## 2013-03-10 DIAGNOSIS — H8303 Labyrinthitis, bilateral: Secondary | ICD-10-CM

## 2013-03-10 MED ORDER — CETIRIZINE HCL 10 MG PO TABS: 10.0000 mg | ORAL_TABLET | Freq: Every day | ORAL | Status: DC

## 2013-03-28 ENCOUNTER — Ambulatory Visit: Payer: BC Managed Care – PPO | Admitting: Obstetrics & Gynecology

## 2013-04-03 ENCOUNTER — Other Ambulatory Visit: Payer: Self-pay | Admitting: Family Medicine

## 2013-04-03 DIAGNOSIS — Z1231 Encounter for screening mammogram for malignant neoplasm of breast: Secondary | ICD-10-CM

## 2013-04-12 ENCOUNTER — Ambulatory Visit: Payer: BC Managed Care – PPO | Admitting: Obstetrics & Gynecology

## 2013-04-18 ENCOUNTER — Ambulatory Visit: Payer: BC Managed Care – PPO

## 2013-04-18 ENCOUNTER — Ambulatory Visit (INDEPENDENT_AMBULATORY_CARE_PROVIDER_SITE_OTHER): Payer: BC Managed Care – PPO | Admitting: Family Medicine

## 2013-04-18 ENCOUNTER — Encounter: Payer: Self-pay | Admitting: Family Medicine

## 2013-04-18 VITALS — BP 110/76 | HR 71 | Temp 97.9°F | Ht 66.0 in | Wt 266.0 lb

## 2013-04-18 DIAGNOSIS — R42 Dizziness and giddiness: Secondary | ICD-10-CM

## 2013-04-18 DIAGNOSIS — F329 Major depressive disorder, single episode, unspecified: Secondary | ICD-10-CM

## 2013-04-18 DIAGNOSIS — F32A Depression, unspecified: Secondary | ICD-10-CM

## 2013-04-18 DIAGNOSIS — F3289 Other specified depressive episodes: Secondary | ICD-10-CM

## 2013-04-18 NOTE — Patient Instructions (Signed)
Decrease to half a tab daily for 10 days, then half a tab every other day for 10 days.

## 2013-04-18 NOTE — Progress Notes (Signed)
   Subjective:    Patient ID: Kaitlyn Kirby, female    DOB: 04/10/1970, 43 y.o.   MRN: [id number]  HPI pt reports that she has been seen by ENT and they recommended that she f/u w/ her pcp. 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  But now having dizziness like how she feels when she skps her medication. Says her lips will feel numb and feels like head in a vice.  It typically lasts for several minutes but not hours. No known trigger.  He can happen at anytime. pt stated that she has been taking 10 mg of celexa for the past 1-2 years and wonders if this could be causing her symptoms. She's a little bit fearful of coming off because it does help treat her depression.     Review of Systems     Objective:   Physical Exam  Constitutional: She is oriented to person, place, and time. She appears well-developed and well-nourished.  HENT:  Head: Normocephalic and atraumatic.  Eyes: Conjunctivae and EOM are normal.  Cardiovascular: Normal rate.   Pulmonary/Chest: Effort normal.  Neurological: She is alert and oriented to person, place, and time.  Skin: Skin is dry. No pallor.  Psychiatric: She has a normal mood and affect. Her behavior is normal.          Assessment & Plan:  Dizziness - She really feels like it may be related to the citalopram. We discussed option of weaning medication.  Decrease to half a tab daily for 10 days, then half a tab every other day for 10 days.   Her that the only way we can tell that the medication is to completely stop it. I would like to see her back in 4 weeks. She will have been off the medication for one week at that point in time. If she's still experiencing symptoms that it is not likely to be the medication and may in fact actually be anxiety. With the lips tingling and feeling numb, feeling dizzy and having a slight intense headache that is temporary this could be more anxiety related. Also consider a migraine  variant as a possibility as well.  Depression-we'll just have to see how she does off the medication. We will likely have to find a replacement but for now I just want to stop it and see how she does. We will not know if it's a medication side effect without doing this.

## 2013-04-19 ENCOUNTER — Ambulatory Visit: Payer: BC Managed Care – PPO | Admitting: Obstetrics & Gynecology

## 2013-04-25 ENCOUNTER — Encounter: Payer: Self-pay | Admitting: Obstetrics & Gynecology

## 2013-04-25 ENCOUNTER — Ambulatory Visit (INDEPENDENT_AMBULATORY_CARE_PROVIDER_SITE_OTHER): Payer: BC Managed Care – PPO | Admitting: Obstetrics & Gynecology

## 2013-04-25 VITALS — BP 121/84 | HR 75 | Resp 16 | Ht 66.0 in | Wt 266.0 lb

## 2013-04-25 DIAGNOSIS — Z113 Encounter for screening for infections with a predominantly sexual mode of transmission: Secondary | ICD-10-CM

## 2013-04-25 DIAGNOSIS — N909 Noninflammatory disorder of vulva and perineum, unspecified: Secondary | ICD-10-CM

## 2013-04-25 DIAGNOSIS — Z01419 Encounter for gynecological examination (general) (routine) without abnormal findings: Secondary | ICD-10-CM

## 2013-04-25 DIAGNOSIS — Z1151 Encounter for screening for human papillomavirus (HPV): Secondary | ICD-10-CM

## 2013-04-25 DIAGNOSIS — Z124 Encounter for screening for malignant neoplasm of cervix: Secondary | ICD-10-CM

## 2013-04-25 NOTE — Progress Notes (Signed)
  Subjective:     Kaitlyn Kirby is a 43 y.o. female here for a routine exam.  Current complaints: dizziness (seeing ENT and primary care).  Personal health questionnaire reviewed: yes.  Vulvar itching is much improved.  Uses temovate sporadically.   Currently having some itching today.   Gynecologic History Patient's last menstrual period was 04/18/2013. Contraception: abstinence Last Pap: 2012. Results were: normal Last mammogram: 2014. Results were: normal Birads 1  Obstetric History OB History  Gravida Para Term Preterm AB SAB TAB Ectopic Multiple Living  0                Past Medical History  Diagnosis Date  . IBS (irritable bowel syndrome) 8-10 years ago  . Hemorrhoids   . Allergy   . Asthma     bronchial spasms  . Abnormal Pap smear 1995    Colpo  . Basal cell carcinoma 2010  . Rectal fissure    Past Surgical History  Procedure Laterality Date  . Skin cancer excision  2010    chest, basal cell  . Wisdom tooth extraction  43 years old   Family History  Problem Relation Age of Onset  . Hypertension Mother   . Hypertension Father   . Heart attack Father   . Diabetes Brother   . Thyroid disease Mother      The following portions of the patient's history were reviewed and updated as appropriate: allergies, current medications, past family history, past medical history, past social history, past surgical history and problem list.  Review of Systems Pertinent items are noted in HPI.    Objective:      Filed Vitals:   04/25/13 1329  BP: 121/84  Pulse: 75  Resp: 16  Height: 5\' 6"  (1.676 m)  Weight: 266 lb (120.657 kg)   Vitals:  WNL General appearance: alert, cooperative and no distress Head: Normocephalic, without obvious abnormality, atraumatic Eyes: negative Throat: lips, mucosa, and tongue normal; teeth and gums normal Lungs: clear to auscultation bilaterally Breasts: normal appearance, no masses or tenderness, No nipple retraction or dimpling, No  nipple discharge or bleeding Heart: regular rate and rhythm Abdomen: soft, non-tender; bowel sounds normal; no masses,  no organomegaly Pelvic:  cervix normal in appearance external genitalia is mildly erythematous.  Still room for improvement for vulvar hygiene Adnexa:  No masses, NT, limited by habitus Bladder: no tenderness Vagina:  moderate amount of creamy, curdlike discharge  Urethra without abnormality or discharge Uterus normal size, shape, and consistency but limited by discharge Refused rectovaginal exam Extremities: no edema, redness or tenderness in the calves or thighs Skin: no lesions or rash Lymph nodes: Axillary adenopathy: none         Assessment:    Healthy female exam.    Plan:    Education reviewed: self breast exams, skin cancer screening and weight bearing exercise. Contraception: abstinence. Mammogram ordered. Follow up in: 1 year. Reviewed vulvar hygiene. Bdaffirm today with pap

## 2013-04-26 NOTE — Addendum Note (Signed)
Addended by: Asencion Islam on: 04/26/2013 08:15 AM   Modules accepted: Orders

## 2013-05-05 ENCOUNTER — Other Ambulatory Visit: Payer: Self-pay | Admitting: Obstetrics & Gynecology

## 2013-05-05 MED ORDER — METRONIDAZOLE 500 MG PO TABS: 500.0000 mg | ORAL_TABLET | Freq: Two times a day (BID) | ORAL | Status: DC

## 2013-05-05 MED ORDER — FLUCONAZOLE 150 MG PO TABS: 150.0000 mg | ORAL_TABLET | Freq: Once | ORAL | Status: DC

## 2013-05-05 NOTE — Progress Notes (Signed)
Pt has both BV and yeast.  Rx to pharmacies.  RN to call

## 2013-05-09 ENCOUNTER — Other Ambulatory Visit: Payer: Self-pay | Admitting: Family Medicine

## 2013-05-16 ENCOUNTER — Ambulatory Visit: Payer: BC Managed Care – PPO | Admitting: Family Medicine

## 2013-06-15 ENCOUNTER — Ambulatory Visit (INDEPENDENT_AMBULATORY_CARE_PROVIDER_SITE_OTHER): Payer: BC Managed Care – PPO

## 2013-06-15 ENCOUNTER — Ambulatory Visit: Payer: BC Managed Care – PPO

## 2013-06-15 DIAGNOSIS — Z1231 Encounter for screening mammogram for malignant neoplasm of breast: Secondary | ICD-10-CM

## 2013-07-18 ENCOUNTER — Ambulatory Visit (INDEPENDENT_AMBULATORY_CARE_PROVIDER_SITE_OTHER): Payer: BC Managed Care – PPO | Admitting: Family Medicine

## 2013-07-18 ENCOUNTER — Encounter: Payer: Self-pay | Admitting: Family Medicine

## 2013-07-18 VITALS — BP 106/72 | HR 76 | Ht 66.0 in | Wt 256.0 lb

## 2013-07-18 DIAGNOSIS — R635 Abnormal weight gain: Secondary | ICD-10-CM

## 2013-07-18 DIAGNOSIS — F329 Major depressive disorder, single episode, unspecified: Secondary | ICD-10-CM

## 2013-07-18 DIAGNOSIS — F32A Depression, unspecified: Secondary | ICD-10-CM

## 2013-07-18 MED ORDER — SERTRALINE HCL 50 MG PO TABS: ORAL_TABLET | ORAL | Status: DC

## 2013-07-18 NOTE — Progress Notes (Signed)
   Subjective:    Patient ID: Kaitlyn Kirby, female    DOB: May 28, 1970, 43 y.o.   MRN: [id number]  HPI Here today to followup on depression. She complains of feeling down depressed and hopeless newly every day. She's having sleep issues and constantly feels tired. She feels bad about herself is having difficulty concentrating. She has had thoughts of feeling better off dead more than half the days. Came off her antidepressant about 4 months ago.  Work has been really stressful.  Has also gained 30 lbs.  Says felt like she was going to explode.  Says she doesn;t fee like herself. She also complains of some increased anxiety and irritability. She says a couple times she is a most like on her job. She is a Chief Technology Officer.  Weight gain - she has been working out has been using [employer].  She would like to refer to the bariatric clinic here locally. She is most interested in getting therapy and also getting nutrition counseling. She is not interested in weight loss surgery. She feels like her weight gain is directly related to her depressed mood.  Review of Systems     Objective:   Physical Exam  Constitutional: She is oriented to person, place, and time. She appears well-developed and well-nourished.  HENT:  Head: Normocephalic and atraumatic.  Cardiovascular: Normal rate, regular rhythm and normal heart sounds.   Pulmonary/Chest: Effort normal and breath sounds normal.  Neurological: She is alert and oriented to person, place, and time.  Skin: Skin is warm and dry.  Psychiatric: She has a normal mood and affect. Her behavior is normal.          Assessment & Plan:  Depression-PHQ 9 score of 21 today. Uncontrolled.   We discussed different options. She would like to avoid going back on the citalopram. We'll start sertraline. followup in one month to make sure that she's doing well. She has any problems in the interim please call the office back. Discussed potential side  effects.  Abnormal weight gain-will refer to bariatric clinic with Dr. Loyal Gambler.    Time spent 25 min, > 50%spent counseling about depression and weight gain.

## 2013-07-19 LAB — TSH: TSH: 1.846 u[IU]/mL (ref 0.350–4.500)

## 2013-07-19 NOTE — Progress Notes (Signed)
Quick Note:  All labs are normal. ______ 

## 2013-08-15 ENCOUNTER — Encounter: Payer: Self-pay | Admitting: Family Medicine

## 2013-08-15 ENCOUNTER — Ambulatory Visit (INDEPENDENT_AMBULATORY_CARE_PROVIDER_SITE_OTHER): Payer: BC Managed Care – PPO | Admitting: Family Medicine

## 2013-08-15 VITALS — BP 119/71 | HR 69 | Wt 262.0 lb

## 2013-08-15 DIAGNOSIS — F329 Major depressive disorder, single episode, unspecified: Secondary | ICD-10-CM

## 2013-08-15 DIAGNOSIS — F3289 Other specified depressive episodes: Secondary | ICD-10-CM

## 2013-08-15 MED ORDER — SERTRALINE HCL 100 MG PO TABS: 100.0000 mg | ORAL_TABLET | Freq: Every day | ORAL | Status: DC

## 2013-08-15 MED ORDER — PHENTERMINE-TOPIRAMATE ER 3.75-23 MG PO CP24: 1.0000 | ORAL_CAPSULE | Freq: Every morning | ORAL | Status: DC

## 2013-08-15 NOTE — Progress Notes (Signed)
   Subjective:    Patient ID: Kaitlyn Kirby, female    DOB: 09-28-1970, 43 y.o.   MRN: [id number] HPI Stopped her citalopram about 3 months ago for dizziness. Then saw her 4 weeks ago and we started sertraline. She feels like her anxiety is much better. Still feeling low energy.  Sleeping well and too much at times.  Still has days when feeling down.  She still has some negative feelings about herself but it's mostly related to her weight. We have put in a referral for bariatric solutions last time she was here but she says she never got a call from the office. But she is planning on going to [place] for couple months this summer and they may or may not be in town.   Review of Systems     Objective:   Physical Exam  Constitutional: She is oriented to person, place, and time. She appears well-developed and well-nourished.  HENT:  Head: Normocephalic and atraumatic.  Cardiovascular: Normal rate, regular rhythm and normal heart sounds.   Pulmonary/Chest: Effort normal and breath sounds normal.  Neurological: She is alert and oriented to person, place, and time.  Skin: Skin is warm and dry.  Psychiatric: She has a normal mood and affect. Her behavior is normal.          Assessment & Plan:  Depression - PHQ score of 12, was 21. Almost 50% reduction in sxs. Will increase sertraline to 100mg . I would like to see her back in about 2 months to make sure she's doing well. She can send me a note to remind her if she's going to be in [place] little longer and we can always refill it for an additional month. Next  Obesity/BMI 42 - Discussed optoins. Will try Qsymia. Discussed potential side effects. Stop immediately if any chest pain or shortness of breath. We'll start with a starting dose. Coupon card provided. Followup in one month for blood pressure and weight check. She's doing well at that point we can provide a three-month supply of the next dose. Needs to be a part of the diet and exercise  program.She agrees to care plan.

## 2013-08-22 ENCOUNTER — Other Ambulatory Visit: Payer: Self-pay | Admitting: *Deleted

## 2013-08-22 MED ORDER — PHENTERMINE-TOPIRAMATE ER 3.75-23 MG PO CP24: 1.0000 | ORAL_CAPSULE | Freq: Every morning | ORAL | Status: DC

## 2013-08-30 ENCOUNTER — Other Ambulatory Visit: Payer: Self-pay | Admitting: *Deleted

## 2013-08-30 MED ORDER — PHENTERMINE-TOPIRAMATE ER 7.5-46 MG PO CP24: 1.0000 | ORAL_CAPSULE | Freq: Every day | ORAL | Status: DC

## 2013-09-04 ENCOUNTER — Telehealth: Payer: Self-pay | Admitting: *Deleted

## 2013-09-04 NOTE — Telephone Encounter (Signed)
Qsymia Approved from 08/05/13 to 03/03/14.  Oscar La, LPN

## 2013-09-12 ENCOUNTER — Ambulatory Visit (INDEPENDENT_AMBULATORY_CARE_PROVIDER_SITE_OTHER): Payer: BC Managed Care – PPO | Admitting: Sports Medicine

## 2013-09-14 ENCOUNTER — Encounter: Payer: Self-pay | Admitting: Family Medicine

## 2013-09-14 ENCOUNTER — Ambulatory Visit (INDEPENDENT_AMBULATORY_CARE_PROVIDER_SITE_OTHER): Payer: BC Managed Care – PPO | Admitting: Family Medicine

## 2013-09-14 VITALS — BP 128/82 | HR 93 | Ht 66.0 in | Wt 248.0 lb

## 2013-09-14 DIAGNOSIS — Z6835 Body mass index (BMI) 35.0-35.9, adult: Secondary | ICD-10-CM | POA: Insufficient documentation

## 2013-09-14 DIAGNOSIS — F3289 Other specified depressive episodes: Secondary | ICD-10-CM

## 2013-09-14 DIAGNOSIS — R21 Rash and other nonspecific skin eruption: Secondary | ICD-10-CM

## 2013-09-14 DIAGNOSIS — R635 Abnormal weight gain: Secondary | ICD-10-CM

## 2013-09-14 DIAGNOSIS — F329 Major depressive disorder, single episode, unspecified: Secondary | ICD-10-CM

## 2013-09-14 MED ORDER — TRIAMCINOLONE ACETONIDE 0.5 % EX OINT: 1.0000 "application " | TOPICAL_OINTMENT | Freq: Two times a day (BID) | CUTANEOUS | Status: DC

## 2013-09-14 NOTE — Progress Notes (Signed)
   Subjective:    Patient ID: Kaitlyn Kirby, female    DOB: 01-19-1971, 43 y.o.   MRN: [id number]  HPI abnormal weight gain - Has lost 14 lbs. Working out 3-4 times a week.  Doing circuit training.  One day a week she walks.  She is not counting calories but she has really cut back on portions sizes.  She says not really thinking about food.    F/U depression - she is doing well on the sertraline 100mg . She says she feels 110% better. Has had some good life changes as well. She is no longer oversleeping. Her job is changing and she is excited about that. 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   She also has a red itchy area on her left mid back area. It started on Monday. She thinks it may have been from a bug bite but is not sure. She has not been out working in the yard [employer]. Review of Systems     Objective:   Physical Exam  Constitutional: She is oriented to person, place, and time. She appears well-developed and well-nourished.  HENT:  Head: Normocephalic and atraumatic.  Cardiovascular: Normal rate, regular rhythm and normal heart sounds.   Pulmonary/Chest: Effort normal and breath sounds normal.  Neurological: She is alert and oriented to person, place, and time.  Skin: Skin is warm and dry.  Psychiatric: She has a normal mood and affect. Her behavior is normal.    On her right mid back she has an approximately 3 x 5 cm area of erythema with some induration in the center. No open wounds drainage or sign of abscess.     Assessment & Plan:  Abnormal weight gain /severe obesity/BMI 40-she is on fantastic and has lost 14 pounds. She's feeling well with the Qsymia. Continue current regimen. She will be in [place] for most of the summer. She already has the next 2 months prescription so we'll have her followup at that time. Encouraged her to continue with diet and exercise changes and really maximize the benefits of the medication.  Depression - well controlled on  current regimen. The sertraline 100 mg is working very well for her. As a look to see her PHQ 9 score, under 4. The she rated to 4 points or appetite.  Rash on mid back, most consistent with insect bite/contact dermatitis. We'll treat with topical steroid cream. If not improving over the next week then please let me know.

## 2013-10-16 ENCOUNTER — Other Ambulatory Visit: Payer: Self-pay | Admitting: Family Medicine

## 2013-11-16 ENCOUNTER — Ambulatory Visit (INDEPENDENT_AMBULATORY_CARE_PROVIDER_SITE_OTHER): Payer: BC Managed Care – PPO | Admitting: Family Medicine

## 2013-11-16 VITALS — BP 130/69 | HR 92 | Wt 246.0 lb

## 2013-11-16 DIAGNOSIS — R635 Abnormal weight gain: Secondary | ICD-10-CM | POA: Diagnosis not present

## 2013-11-16 DIAGNOSIS — Z13220 Encounter for screening for lipoid disorders: Secondary | ICD-10-CM

## 2013-11-16 MED ORDER — PHENTERMINE-TOPIRAMATE ER 7.5-46 MG PO CP24: 1.0000 | ORAL_CAPSULE | Freq: Every day | ORAL | Status: DC

## 2013-11-16 MED ORDER — DILTIAZEM GEL 2 %: 1.0000 "application " | Freq: Four times a day (QID) | CUTANEOUS | Status: DC | PRN

## 2013-11-16 MED ORDER — ALBUTEROL SULFATE HFA 108 (90 BASE) MCG/ACT IN AERS: INHALATION_SPRAY | RESPIRATORY_TRACT | Status: DC

## 2013-11-16 NOTE — Progress Notes (Signed)
   Subjective:    Patient ID: Kaitlyn Kirby, female    DOB: 1970-11-10, 43 y.o.   MRN: [id number]  HPI Abnormal weight gain - She has been in [facility] for the last 2 months.  She felll and hurt her hip and hasn't been able to exercise for about 2 weeks.  She has been eating more at work.  Says dropped into the 230s.  She is fitting into a 16 now.  No CP or SOB or palpitations on the medication. Occ feels jittery on it.    Review of Systems     Objective:   Physical Exam  Constitutional: She is oriented to person, place, and time. She appears well-developed and well-nourished.  HENT:  Head: Normocephalic and atraumatic.  Cardiovascular: Normal rate, regular rhythm and normal heart sounds.   Pulmonary/Chest: Effort normal and breath sounds normal.  Neurological: She is alert and oriented to person, place, and time.  Skin: Skin is warm and dry.  Psychiatric: She has a normal mood and affect. Her behavior is normal.          Assessment & Plan:  Abnormal weight gain/BMI 39 - Discussed getting on track now that she is home.  Hopefully her hip we'll continue to improve and she can get back into her regular exercise routine. If not then we may need to take a look at her head. Overall though I think she has made great progress. We'll continue the medication as she has not had any adverse fracture. Followup in one month for blood pressure and weight check. We also discussed at some point we can certainly increase the medication if she feels she started to plateau before reaching her goal.  Time spent 15 minutes, greater than 50% spent counseling about abnormal weight gain.

## 2013-11-17 ENCOUNTER — Encounter: Payer: Self-pay | Admitting: Family Medicine

## 2013-12-05 ENCOUNTER — Telehealth: Payer: Self-pay | Admitting: *Deleted

## 2013-12-05 NOTE — Telephone Encounter (Signed)
Kaitlyn Kirby and pharmacy aware of Qysimia approval thru Dec. 2015 only

## 2014-01-02 ENCOUNTER — Telehealth: Payer: Self-pay

## 2014-01-02 ENCOUNTER — Ambulatory Visit (INDEPENDENT_AMBULATORY_CARE_PROVIDER_SITE_OTHER): Payer: BC Managed Care – PPO | Admitting: Family Medicine

## 2014-01-02 DIAGNOSIS — R635 Abnormal weight gain: Secondary | ICD-10-CM

## 2014-01-02 MED ORDER — PHENTERMINE-TOPIRAMATE ER 7.5-46 MG PO CP24: 1.0000 | ORAL_CAPSULE | Freq: Every day | ORAL | Status: DC

## 2014-01-02 NOTE — Telephone Encounter (Signed)
Kaitlyn Kirby may decide to stop taking the Qysmia and request recommendations for stopping Qysmia. She is concerned that stopping Qsymia suddenly may cause serious problems, such as seizures.

## 2014-01-02 NOTE — Progress Notes (Signed)
   Subjective:    Patient ID: Kaitlyn Kirby, female    DOB: 1971-03-12, 43 y.o.   MRN: [id number]  HPI  Kaitlyn Kirby is here for blood pressure and weight check. She reports poor concentration, lip and gum numbness and abnormal hair loss. She is considering stopping the Qysmia due to her lack of motivation to exercise. She would like another month to decide. Denies chest pain, shortness of breath or headaches.   Review of Systems     Objective:   Physical Exam        Assessment & Plan:  She has lost weight from 246 to 244 lbs. A prescription of Qysmia will be faxed to [facility] today.    Agree with above.    Beatrice Lecher, MD

## 2014-01-03 NOTE — Telephone Encounter (Signed)
Left message for patient to return call.

## 2014-01-03 NOTE — Telephone Encounter (Signed)
Since we did go ahead and send a refill, she can call when she is ready to taper it then please let me know and we can send over the lower dose. She is ready to taper now been encouraged her not to pick up a new prescription and we can send a different one.

## 2014-02-02 ENCOUNTER — Ambulatory Visit (INDEPENDENT_AMBULATORY_CARE_PROVIDER_SITE_OTHER): Payer: BC Managed Care – PPO | Admitting: Family Medicine

## 2014-02-02 ENCOUNTER — Encounter: Payer: Self-pay | Admitting: Family Medicine

## 2014-02-02 VITALS — BP 110/69 | HR 95 | Wt 239.0 lb

## 2014-02-02 DIAGNOSIS — R635 Abnormal weight gain: Secondary | ICD-10-CM | POA: Diagnosis not present

## 2014-02-02 DIAGNOSIS — J302 Other seasonal allergic rhinitis: Secondary | ICD-10-CM

## 2014-02-02 MED ORDER — PHENTERMINE-TOPIRAMATE ER 7.5-46 MG PO CP24: 1.0000 | ORAL_CAPSULE | Freq: Every day | ORAL | Status: DC

## 2014-02-02 NOTE — Progress Notes (Signed)
   Subjective:    Patient ID: Kaitlyn Kirby, female    DOB: 09-Jul-1970, 43 y.o.   MRN: [id number]  HPI  Abnormal weight loss - she has some concerns about the key see me that she's currently taking for weight loss. She's been on it for several months at this point.She felt like she has been losing some hair.she asked her hairdresser but her hairdresser felt that her loss was definitely minimal.She also feels like fall allergies have been worse.  Has been more forgetfull.  She has been really stressed and overloadat work. She is a Arts administrator. 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  Using Flonaseher allergies..  Hasn't been able to go to the gym as much as she would likeover the last couple of months. She knows if she can get back to the gym regularly it would make a huge impact on her weight loss..     Review of Systems     Objective:   Physical Exam  Constitutional: She is oriented to person, place, and time. She appears well-developed and well-nourished.  HENT:  Head: Normocephalic and atraumatic.  Cardiovascular: Normal rate, regular rhythm and normal heart sounds.   Pulmonary/Chest: Effort normal and breath sounds normal.  Neurological: She is alert and oriented to person, place, and time.  Skin: Skin is warm and dry.  Psychiatric: She has a normal mood and affect. Her behavior is normal.          Assessment & Plan:  Abnormal weight gain-overall I do think she is doing well. I agree we need to come off some strategies to help her get back to the gym. It sounds like after about 2-3 weeks she'll be able to get back into the gym more consistently which will help. She is still managed to lose 2 pounds over the last month with the Qsymia or without exercise. We'll go ahead and refill today again. Certainly some of her memory issues could be related to  the Topamax portion of the Qsymia.  Though, it is such a low dose would be more likely to think that the stress levels etc. Contributing more to difficulty with focus and memory. I like to see if this improves over the next month. If not we can certainly consider discontinuing the Qsymia if she would like.  Allergic rhinitis-continue with Flonase.  Time spent 20 minutes, greater than 50% time spent counseling about abnormal weight gain, difficulty concentrating, and hair loss.

## 2014-03-07 ENCOUNTER — Ambulatory Visit (INDEPENDENT_AMBULATORY_CARE_PROVIDER_SITE_OTHER): Payer: BC Managed Care – PPO | Admitting: Family Medicine

## 2014-03-07 VITALS — BP 108/74 | HR 87 | Wt 238.0 lb

## 2014-03-07 DIAGNOSIS — R635 Abnormal weight gain: Secondary | ICD-10-CM | POA: Diagnosis not present

## 2014-03-07 MED ORDER — PHENTERMINE-TOPIRAMATE ER 7.5-46 MG PO CP24: 1.0000 | ORAL_CAPSULE | Freq: Every day | ORAL | Status: DC

## 2014-03-07 NOTE — Progress Notes (Signed)
Voicemail left for Kaitlyn Kirby and reminder of her next scheduled office visit 04/05/14. Margette Fast, CMA

## 2014-03-07 NOTE — Progress Notes (Signed)
   Subjective:    Patient ID: Kaitlyn Kirby, female    DOB: [birth year], 43 y.o.   MRN: [id number]  HPI  [name] came to office today for BP and weight check. She denies SOB, BP or mood swings associated with this medication. Margette Fast, CMA    Review of Systems     Objective:   Physical Exam        Assessment & Plan:

## 2014-03-07 NOTE — Progress Notes (Signed)
   Subjective:    Patient ID: Kaitlyn Kirby, female    DOB: 09/28/1970, 43 y.o.   MRN: [id number]  HPI    Review of Systems     Objective:   Physical Exam        Assessment & Plan:  Abnormal weight gain - has only lost one pound.  I will refll for one month. F/U with me next month.  We may need to adjust her dose. Make sure working on diet and exercising for 30 min 5 days per weeks.   Beatrice Lecher, MD

## 2014-03-17 ENCOUNTER — Other Ambulatory Visit: Payer: Self-pay | Admitting: Family Medicine

## 2014-04-05 ENCOUNTER — Encounter: Payer: Self-pay | Admitting: Family Medicine

## 2014-04-05 ENCOUNTER — Ambulatory Visit (INDEPENDENT_AMBULATORY_CARE_PROVIDER_SITE_OTHER): Payer: BC Managed Care – PPO | Admitting: Family Medicine

## 2014-04-05 VITALS — BP 114/78 | HR 84 | Ht 66.0 in | Wt 244.0 lb

## 2014-04-05 DIAGNOSIS — K5900 Constipation, unspecified: Secondary | ICD-10-CM

## 2014-04-05 DIAGNOSIS — K649 Unspecified hemorrhoids: Secondary | ICD-10-CM | POA: Diagnosis not present

## 2014-04-05 DIAGNOSIS — R635 Abnormal weight gain: Secondary | ICD-10-CM | POA: Diagnosis not present

## 2014-04-05 MED ORDER — PHENTERMINE-TOPIRAMATE ER 11.25-69 MG PO CP24: 1.0000 | ORAL_CAPSULE | Freq: Every day | ORAL | Status: DC

## 2014-04-05 NOTE — Progress Notes (Signed)
   Subjective:    Patient ID: Kaitlyn Kirby, female    DOB: January 16, 1971, 44 y.o.   MRN: [id number]  HPI  She has been bloated and constipated for 2 weeks. Says her hemorrhoids have been flaring. Bleeds after BM.  Says her pain is better. Using the Miralax once day. Says the hemorrhage cream is not really helping. Says she is very frustrated ad wants to know what to do about them.  She is interested in surgical options.   Abnormal weight gain-  Has been working out at the gym 3 days per week. Says did go out of town for [employer] and say ate a little more than usually but not a large amount. She says has been swelling and retaining fluid.    Review of Systems     Objective:   Physical Exam  Constitutional: She is oriented to person, place, and time. She appears well-developed and well-nourished.  HENT:  Head: Normocephalic and atraumatic.  Cardiovascular: Normal rate, regular rhythm and normal heart sounds.   Pulmonary/Chest: Effort normal and breath sounds normal.  Neurological: She is alert and oriented to person, place, and time.  Skin: Skin is warm and dry.  Psychiatric: She has a normal mood and affect. Her behavior is normal.          Assessment & Plan:  Abnormal weight gain - WE discussed options. Will refill at higher dose for one more month. Encouraged her to get back on track with diet and exercise for the next 30 days.  F/U in 1 months.   Constipation- recommend a trial of stimulant laxative to get bowels moving and then continue daily miralax.   Hemorrhoids - she request referral for consult fo surgical options.  She is just very frustrated with frequent flares.

## 2014-04-05 NOTE — Patient Instructions (Signed)
Hemorrhoid Banding Hemorrhoids are veins in the anus and lower rectum that become enlarged. The most common symptoms are rectal bleeding, itching, and sometimes pain. Hemorrhoids might come out with straining or having a bowel movement, and they can sometimes be pushed back in. There are internal and external hemorrhoids. Only internal hemorrhoids can be treated with banding. In this procedure, a rubber band is placed near the hemorrhoid tissue, cutting off the blood supply. This procedure prevents the hemorrhoids from slipping down. LET YOUR CAREGIVER KNOW ABOUT: All medicines you are taking, especially blood thinners such as aspirin and coumadin.  RISKS AND COMPLICATIONS This is not a painful procedure, but if you do have intense pain immediately let your surgeon know because the band may need to be removed. You may have some mild pain or discomfort in the first 2 days or so after treatment. Sometimes there may be delayed bleeding in the first week after treatment.  BEFORE THE PROCEDURE  There is no special preparation needed before banding. Your surgeon may have you do an enema prior to the procedure. You will go home the same day.  HOME CARE INSTRUCTIONS   Your surgeon might instruct you to do sitz baths as needed if you have discomfort or after a bowel movement.  You may be instructed to use fiber supplements. SEEK MEDICAL CARE IF:  You have an increase in pain.  Your pain does not get better. SEEK IMMEDIATE MEDICAL CARE IF:  You have intense pain.  Fever greater than 100.5 F (38.1 C).  Bleeding that does not stop, or pus from the anus. Document Released: 01/11/2009 Document Revised: 06/08/2011 Document Reviewed: 01/11/2009 [facility] Patient Information 2015 [place], [place]. This information is not intended to replace advice given to you by your health care provider. Make sure you discuss any questions you have with your health care provider. Hemorrhoidectomy Hemorrhoidectomy is  surgery to remove hemorrhoids. Hemorrhoids are veins that have become swollen in the rectum. The rectum is the area from the bottom end of the intestines to the opening where bowel movements leave the body. Hemorrhoids can be uncomfortable. They can cause itching, bleeding and pain if a blood clot forms in them (thrombose). If hemorrhoids are small, surgery may not be needed. But if they cover a larger area, surgery is usually suggested.  LET YOUR CAREGIVER KNOW ABOUT:   Any allergies.  All medications you are taking, including:  Herbs, eyedrops, over-the-counter medications and creams.  Blood thinners (anticoagulants), aspirin or other drugs that could affect blood clotting.  Use of steroids (by mouth or as creams).  Previous problems with anesthetics, including local anesthetics.  Possibility of pregnancy, if this applies.  Any history of blood clots.  Any history of bleeding or other blood problems.  Previous surgery.  Smoking history.  Other health problems. RISKS AND COMPLICATIONS All surgery carries some risk. However, hemorrhoid surgery usually goes smoothly. Possible complications could include:  Urinary retention.  Bleeding.  Infection.  A painful incision.  A reaction to the anesthesia (this is not common). BEFORE THE PROCEDURE   Stop using aspirin and non-steroidal anti-inflammatory drugs (NSAIDs) for pain relief. This includes prescription drugs and over-the-counter drugs such as ibuprofen and naproxen. Also stop taking vitamin E. If possible, do this two weeks before your surgery.  If you take blood-thinners, ask your healthcare provider when you should stop taking them.  You will probably have blood and urine tests done several days before your surgery.  Do not eat or drink for  about 8 hours before the surgery.  Arrive at least an hour before the surgery, or whenever your surgeon recommends. This will give you time to check in and fill out any needed  paperwork.  Hemorrhoidectomy is often an outpatient procedure. This means you will be able to go home the same day. Sometimes, though, people stay overnight in the hospital after the procedure. Ask your surgeon what to expect. Either way, make arrangements in advance for someone to drive you home. PROCEDURE   The preparation:  You will change into a hospital gown.  You will be given an IV. A needle will be inserted in your arm. Medication can flow directly into your body through this needle.  You might be given an enema to clear your rectum.  Once in the operating room, you will probably lie on your side or be repositioned later to lying on your stomach.  You will be given anesthesia (medication) so you will not feel anything during the surgery. The surgery often is done with local anesthesia (the area near the hemorrhoids will be numb and you will be drowsy but awake). Sometimes, general anesthesia is used (you will be asleep during the procedure).  The procedure:  There are a few different procedures for hemorrhoids. Be sure to ask you surgeon about the procedure, the risks and benefits.  Be sure to ask about what you need to do to take care of the wound, if there is one. AFTER THE PROCEDURE  You will stay in a recovery area until the anesthesia has worn off. Your blood pressure and pulse will be checked every so often.  You may feel a lot of pain in the area of the rectum.  Take all pain medication prescribed by your surgeon. Ask before taking any over-the-counter pain medicines.  Sometimes sitting in a warm bath can help relieve your pain.  To make sure you have bowel movements without straining:  You will probably need to take stool softeners (usually a pill) for a few days.  You should drink 8 to 10 glasses of water each day.  Your activity will be restricted for awhile. Ask your caregiver for a list of what you should and should not do while you recover. Document  Released: 01/11/2009 Document Revised: 06/08/2011 Document Reviewed: 01/11/2009 [facility] Patient Information 2015 [place], [place]. This information is not intended to replace advice given to you by your health care provider. Make sure you discuss any questions you have with your health care provider.

## 2014-05-03 ENCOUNTER — Ambulatory Visit (INDEPENDENT_AMBULATORY_CARE_PROVIDER_SITE_OTHER): Payer: BC Managed Care – PPO | Admitting: Family Medicine

## 2014-05-03 ENCOUNTER — Ambulatory Visit: Payer: BC Managed Care – PPO | Admitting: Obstetrics & Gynecology

## 2014-05-03 ENCOUNTER — Telehealth: Payer: Self-pay | Admitting: *Deleted

## 2014-05-03 VITALS — BP 106/74 | HR 81 | Resp 16 | Wt 240.0 lb

## 2014-05-03 DIAGNOSIS — R635 Abnormal weight gain: Secondary | ICD-10-CM

## 2014-05-03 MED ORDER — PHENTERMINE-TOPIRAMATE ER 11.25-69 MG PO CP24: 1.0000 | ORAL_CAPSULE | Freq: Every day | ORAL | Status: DC

## 2014-05-03 NOTE — Telephone Encounter (Signed)
Message left on vm 

## 2014-05-03 NOTE — Progress Notes (Signed)
   Subjective:    Patient ID: Kaitlyn Kirby, female    DOB: 1970/04/11, 44 y.o.   MRN: [id number]  HPI Patient is here for a weight and blood pressure check. Denies chest pain, shortness of breath, headaches or medication problems. She does notice feeling a little 'jittery' after taking new dose of phentermine; if she eats small portion of food this subsides. Does not want to go back to lower dose until another month of trial.   Review of Systems     Objective:   Physical Exam        Assessment & Plan:  Patient has lost weight. After approval, new prescription will be faxed to [facility]. Patient will have MD visit in one month.

## 2014-05-03 NOTE — Telephone Encounter (Signed)
-----   Message from Marcial Pacas, DO sent at 05/03/2014  2:08 PM EST ----- Seth Bake, Rx placed in in-box ready for pickup/faxing.

## 2014-05-03 NOTE — Progress Notes (Signed)
Refill approved in Dr. Ernst Bowler absence

## 2014-05-04 ENCOUNTER — Ambulatory Visit (INDEPENDENT_AMBULATORY_CARE_PROVIDER_SITE_OTHER): Payer: BC Managed Care – PPO | Admitting: Obstetrics & Gynecology

## 2014-05-04 ENCOUNTER — Encounter: Payer: Self-pay | Admitting: Obstetrics & Gynecology

## 2014-05-04 VITALS — BP 120/83 | HR 95 | Resp 16 | Ht 66.0 in | Wt 243.0 lb

## 2014-05-04 DIAGNOSIS — N760 Acute vaginitis: Secondary | ICD-10-CM | POA: Diagnosis not present

## 2014-05-04 DIAGNOSIS — B373 Candidiasis of vulva and vagina: Secondary | ICD-10-CM

## 2014-05-04 DIAGNOSIS — Z0000 Encounter for general adult medical examination without abnormal findings: Secondary | ICD-10-CM | POA: Insufficient documentation

## 2014-05-04 DIAGNOSIS — Z01411 Encounter for gynecological examination (general) (routine) with abnormal findings: Secondary | ICD-10-CM

## 2014-05-04 MED ORDER — FLUCONAZOLE 150 MG PO TABS: ORAL_TABLET | ORAL | Status: DC

## 2014-05-04 MED ORDER — MISOPROSTOL 200 MCG PO TABS: ORAL_TABLET | ORAL | Status: DC

## 2014-05-04 NOTE — Progress Notes (Signed)
  Subjective:     Kaitlyn Kirby is a 44 y.o. female here for a routine exam.   Current complaints:  1)  Recently became sexually active after 2 years.  Bled at fourchette for several days after.  Was not painful in the beginning, but became a little more uncomfortable at the end.  They did not use lubrication.  Pt only had sex once but wants to again. 2)  Pt doing better with chronic itching.  Is not using the Temovate anymore and has done well.  However, she did have new onset discharge and itching after sexual intercourse and wonders if she has a yeast infection. 3) Pt worried about pigment changes in her labia minora.  She wonders if it had something to do with the steroids. 4)  Menses are regular but last 7 days.  Denies menorrhagia but wishes her menses were shorter.    Gynecologic History Patient's last menstrual period was 04/16/2014. Contraception: condoms Last Pap: 03/2013. Results were: normal (negative co-testing) Last mammogram: 05/2013. Results were: normal  Obstetric History OB History  Gravida Para Term Preterm AB SAB TAB Ectopic Multiple Living  0                  The following portions of the patient's history were reviewed and updated as appropriate: allergies, current medications, past family history, past medical history, past social history, past surgical history and problem list.  Review of Systems Pertinent items are noted in HPI.    Objective:      Filed Vitals:   05/04/14 0842  BP: 120/83  Pulse: 95  Resp: 16  Height: 5\' 6"  (1.676 m)  Weight: 243 lb (110.224 kg)   Vitals:  WNL General appearance: alert, cooperative and no distress Head: Normocephalic, without obvious abnormality, atraumatic Eyes: negative Throat: lips, mucosa, and tongue normal; teeth and gums normal Lungs: clear to auscultation bilaterally Breasts: normal appearance, no masses or tenderness, No nipple retraction or dimpling, No nipple discharge or bleeding Heart: regular rate  and rhythm Abdomen: soft, non-tender; bowel sounds normal; no masses,  no organomegaly  Pelvic:  External Genitalia:  Tanner V, Labia minora hare increased pigmentation, there are some white areas that could be a result of yeast, microcondyloma, or normal variant. Urethra:  No prolapse Vagina:  Pink, normal rugae, no blood; thick white discharge extending onto labia c/w yeast Cervix:  No CMT, no lesion Uterus:  Normal size and contour, non tender Adnexa:  Normal, no masses, non tender  Extremities: no edema, redness or tenderness in the calves or thighs Skin: no lesions or rash Lymph nodes: Axillary adenopathy: none        Assessment:    Healthy female exam.   Desires contraception and some cycle control   Plan:    Contraception: condoms. Mammogram ordered. Follow up in: 2 weeks. Diflucan for yeast.  Will look at labia on return and see if area is still of concern  Skyla with cytotec before.  Return on menses.

## 2014-05-05 LAB — WET PREP BY MOLECULAR PROBE
CANDIDA SPECIES: POSITIVE — AB
Gardnerella vaginalis: POSITIVE — AB
TRICHOMONAS VAG: NEGATIVE

## 2014-05-06 ENCOUNTER — Other Ambulatory Visit: Payer: Self-pay | Admitting: Obstetrics & Gynecology

## 2014-05-06 MED ORDER — METRONIDAZOLE 500 MG PO TABS: 500.0000 mg | ORAL_TABLET | Freq: Two times a day (BID) | ORAL | Status: DC

## 2014-05-07 ENCOUNTER — Telehealth: Payer: Self-pay | Admitting: *Deleted

## 2014-05-07 NOTE — Telephone Encounter (Signed)
Called pt to adv meds at [facility] for BV. Pt stated she has some "white bumps" on vaginal area and was concerned it may be condyloma but has tried some home remedies with good results. I explained to pt that we would need to Bx for Dx. Pt states she wants to keep trying what she has to see if it improves. Pt to follow up and rcv IUD in 8 days.

## 2014-05-07 NOTE — Telephone Encounter (Signed)
-----   Message from Guss Bunde, MD sent at 05/06/2014  7:21 AM EST ----- Patient already given Diflucan on clinical diagnosis.  Flagyl prescribed for BV

## 2014-05-07 NOTE — Telephone Encounter (Signed)
LM on voicemail of positive yeast and BV.  According to chart she already had the meds prescribed.

## 2014-05-15 ENCOUNTER — Ambulatory Visit (INDEPENDENT_AMBULATORY_CARE_PROVIDER_SITE_OTHER): Payer: BC Managed Care – PPO | Admitting: Obstetrics & Gynecology

## 2014-05-15 ENCOUNTER — Encounter: Payer: Self-pay | Admitting: Obstetrics & Gynecology

## 2014-05-15 VITALS — BP 108/78 | HR 96 | Temp 97.0°F | Resp 16 | Ht 66.0 in | Wt 243.0 lb

## 2014-05-15 DIAGNOSIS — Z3009 Encounter for other general counseling and advice on contraception: Secondary | ICD-10-CM | POA: Diagnosis not present

## 2014-05-15 DIAGNOSIS — R350 Frequency of micturition: Secondary | ICD-10-CM

## 2014-05-15 DIAGNOSIS — N926 Irregular menstruation, unspecified: Secondary | ICD-10-CM | POA: Diagnosis not present

## 2014-05-15 DIAGNOSIS — Z30430 Encounter for insertion of intrauterine contraceptive device: Secondary | ICD-10-CM

## 2014-05-15 LAB — POCT URINE PREGNANCY: Preg Test, Ur: NEGATIVE

## 2014-05-15 NOTE — Progress Notes (Signed)
   Subjective:    Patient ID: Kaitlyn Kirby, female    DOB: 04-09-1970, 44 y.o.   MRN: [id number]  HPI   44 yo SW G0 is here today for [facility] insertion. She tells me that her periods are "like clockwork" every month but have changed from 3-4 days to now 7 days. She would like shorter periods. When I did my discussion of risks and benefits of Skyla and I mentioned the risk of irregular bleeding, she has decided not to get the [facility].   She is currently using condoms but says that she does not want any future pregnancies and would consider a BTL/BS.  I mentioned Novasure as another option to decrease the length of her periods.   Review of Systems     Objective:   Physical Exam  WNWHWFNAD Breathing normally Add- obese Neuro - intact      Assessment & Plan:  Contraception- info given on BTL/BS Change in menses- check gyn u/s Consider Novasure and d&c

## 2014-05-16 LAB — CULTURE, URINE COMPREHENSIVE
COLONY COUNT: NO GROWTH
ORGANISM ID, BACTERIA: NO GROWTH

## 2014-05-17 ENCOUNTER — Ambulatory Visit (INDEPENDENT_AMBULATORY_CARE_PROVIDER_SITE_OTHER): Payer: BC Managed Care – PPO

## 2014-05-17 DIAGNOSIS — D259 Leiomyoma of uterus, unspecified: Secondary | ICD-10-CM

## 2014-05-17 DIAGNOSIS — N926 Irregular menstruation, unspecified: Secondary | ICD-10-CM

## 2014-05-24 ENCOUNTER — Ambulatory Visit (INDEPENDENT_AMBULATORY_CARE_PROVIDER_SITE_OTHER): Payer: BC Managed Care – PPO | Admitting: Obstetrics & Gynecology

## 2014-05-24 ENCOUNTER — Encounter: Payer: Self-pay | Admitting: Obstetrics & Gynecology

## 2014-05-24 VITALS — BP 122/84 | HR 103 | Resp 16 | Ht 66.0 in | Wt 244.0 lb

## 2014-05-24 DIAGNOSIS — N895 Stricture and atresia of vagina: Secondary | ICD-10-CM | POA: Diagnosis not present

## 2014-05-24 MED ORDER — CLOBETASOL PROPIONATE 0.05 % EX OINT: TOPICAL_OINTMENT | Freq: Two times a day (BID) | CUTANEOUS | Status: DC

## 2014-05-25 ENCOUNTER — Other Ambulatory Visit: Payer: Self-pay | Admitting: Obstetrics & Gynecology

## 2014-05-25 ENCOUNTER — Telehealth: Payer: Self-pay | Admitting: *Deleted

## 2014-05-25 LAB — WET PREP BY MOLECULAR PROBE
Candida species: POSITIVE — AB
Gardnerella vaginalis: POSITIVE — AB
Trichomonas vaginosis: NEGATIVE

## 2014-05-25 MED ORDER — FLUCONAZOLE 150 MG PO TABS: ORAL_TABLET | ORAL | Status: DC

## 2014-05-25 MED ORDER — TINIDAZOLE 500 MG PO TABS: 2.0000 g | ORAL_TABLET | Freq: Every day | ORAL | Status: DC

## 2014-05-25 NOTE — Progress Notes (Signed)
Persistent candida and BV.  Will treat with fluconazole 150 mg q 72 hours for 4 doses and Tinidazole.

## 2014-05-25 NOTE — Telephone Encounter (Signed)
Pt notified of positive BV and yeast.  RX sent to her pharmacy for both per Dr Gala Romney.

## 2014-05-25 NOTE — Progress Notes (Signed)
   Subjective:    Patient ID: Kaitlyn Kirby, female    DOB: Aug 25, 1970, 44 y.o.   MRN: [id number]  HPI  44 yo female presents c/p bleeding from vagina near the vulva.  This is the same area that had a fissure / tear last visit.  Pt had a TVUS which showed a thin stripe, one fundal fibroid, and nml ovaries.  Pt tearful because she is worried and tired of the area bleeding.  Pt also tore more during the TVUS.  Pt also believes she has had this problem several years ago when she was last sexually active.    Past Medical History  Diagnosis Date  . IBS (irritable bowel syndrome) 8-10 years ago  . Hemorrhoids   . Allergy   . Asthma     bronchial spasms  . Abnormal Pap smear 1995    Colpo  . Basal cell carcinoma 2010  . Rectal fissure    Past Surgical History  Procedure Laterality Date  . Skin cancer excision  2010    chest, basal cell  . Wisdom tooth extraction  44 years old     Review of Systems  Constitutional: Negative.   Respiratory: Negative.   Cardiovascular: Negative.   Gastrointestinal: Negative.   Genitourinary: Positive for vaginal bleeding, vaginal discharge and vaginal pain.  Musculoskeletal: Negative.   Skin: Negative.   Psychiatric/Behavioral: The patient is nervous/anxious.        Objective:   Physical Exam  Constitutional: She is oriented to person, place, and time. She appears well-developed and well-nourished. No distress.  HENT:  Head: Normocephalic and atraumatic.  Eyes: Conjunctivae are normal.  Pulmonary/Chest: Effort normal.  Abdominal: Soft.  Genitourinary:     Musculoskeletal: She exhibits no edema.  Neurological: She is alert and oriented to person, place, and time.  Skin: Skin is warm and dry.  Psychiatric:  Tearful/anxious  Vitals reviewed.         Assessment & Plan:  44 yo female with worsening inflammtion and ulceration of lower vagina onto labia.  1-B affirm resent (can't tolerate speculum exam--blind swab) 2-resume Temovate  for 2 weeks 3-RTC 2 weeks for reevaluation.

## 2014-05-31 ENCOUNTER — Encounter: Payer: Self-pay | Admitting: Family Medicine

## 2014-05-31 ENCOUNTER — Ambulatory Visit (INDEPENDENT_AMBULATORY_CARE_PROVIDER_SITE_OTHER): Payer: BC Managed Care – PPO | Admitting: Family Medicine

## 2014-05-31 VITALS — BP 113/75 | HR 80 | Wt 240.0 lb

## 2014-05-31 DIAGNOSIS — R635 Abnormal weight gain: Secondary | ICD-10-CM | POA: Diagnosis not present

## 2014-05-31 DIAGNOSIS — F329 Major depressive disorder, single episode, unspecified: Secondary | ICD-10-CM

## 2014-05-31 DIAGNOSIS — Z6838 Body mass index (BMI) 38.0-38.9, adult: Secondary | ICD-10-CM

## 2014-05-31 DIAGNOSIS — F32A Depression, unspecified: Secondary | ICD-10-CM

## 2014-05-31 NOTE — Progress Notes (Signed)
   Subjective:    Patient ID: Kaitlyn Kirby, female    DOB: Jul 26, 1970, 44 y.o.   MRN: [id number]  HPI Here today to follow-up for weight loss. She's currently on Qsymia 11.25/69 mg daily. She has lost 3 pounds in the last 4 weeks.  She is feeling well overall. She wants to come off the medication.    Depression - She wants to wean her sertraline.  She feels happy and is ready to come off. She feels her the things that were triggering her anxiety is much better.    Review of Systems     Objective:   Physical Exam  Constitutional: She is oriented to person, place, and time. She appears well-developed and well-nourished.  HENT:  Head: Normocephalic and atraumatic.  Eyes: Conjunctivae and EOM are normal.  Cardiovascular: Normal rate.   Pulmonary/Chest: Effort normal.  Neurological: She is alert and oriented to person, place, and time.  Skin: Skin is dry. No pallor.  Psychiatric: She has a normal mood and affect. Her behavior is normal.          Assessment & Plan:  Depression - well controlled on current regimen she would like to wean off. I wrote out a three-week taper for her to get off the medication. If she has any palms whatsoever then please give the call. We can also wean it more slowly if needed.  Abnormal weight gain - Doing well. She would like to discontinue the [place] a. I will have her decrease to every other day for the next 10 days and then she can stop.

## 2014-05-31 NOTE — Patient Instructions (Addendum)
Decrease sertraline to 1/2 tab daily for 2 weeks, then 1/4 tab daily for 10 days and then stop.   Decrease the Qsymia to every other day for 10 days and then stop.

## 2014-06-07 ENCOUNTER — Ambulatory Visit (INDEPENDENT_AMBULATORY_CARE_PROVIDER_SITE_OTHER): Payer: BC Managed Care – PPO | Admitting: Obstetrics & Gynecology

## 2014-06-07 ENCOUNTER — Encounter: Payer: Self-pay | Admitting: Obstetrics & Gynecology

## 2014-06-07 ENCOUNTER — Other Ambulatory Visit: Payer: Self-pay | Admitting: Obstetrics & Gynecology

## 2014-06-07 VITALS — BP 114/76 | HR 98 | Resp 16 | Ht 66.0 in | Wt 237.0 lb

## 2014-06-07 DIAGNOSIS — Z113 Encounter for screening for infections with a predominantly sexual mode of transmission: Secondary | ICD-10-CM

## 2014-06-07 DIAGNOSIS — R102 Pelvic and perineal pain: Secondary | ICD-10-CM

## 2014-06-07 DIAGNOSIS — N898 Other specified noninflammatory disorders of vagina: Secondary | ICD-10-CM

## 2014-06-07 DIAGNOSIS — N909 Noninflammatory disorder of vulva and perineum, unspecified: Secondary | ICD-10-CM

## 2014-06-07 NOTE — Progress Notes (Signed)
   Subjective:    Patient ID: Kaitlyn Kirby, female    DOB: Feb 17, 1971, 44 y.o.   MRN: [id number]  HPI    Review of Systems     Objective:   Physical Exam        Assessment & Plan:

## 2014-06-07 NOTE — Progress Notes (Signed)
Patient ID: Kaitlyn Kirby, female   DOB: December 21, 1970, 44 y.o.   MRN: [id number] HPI  Pt is here today for follow-up vaginal lesion and vaginal irritation.  She has finished her Tindamax.  She did not use her Temovate this morning due to coming to the office.  Pt states that she feels the tear is healed.  Pt is requesting STD testing today. Since area is still inflamed, we hd long discussion about need for biopsy.  Pt reluctant at first, but agrees after counseling.    Filed Vitals:   06/07/14 0935  BP: 114/76  Pulse: 98  Resp: 16  Height: 5\' 6"  (1.676 m)  Weight: 237 lb (107.502 kg)    Vitals:  WNL General appearance: alert, cooperative and no distress Head: Normocephalic, without obvious abnormality, atraumatic Eyes: negative Abdomen: soft, non-tender; bowel sounds normal; no masses,  no organomegaly  Pelvic:  External Genitalia:  Tanner V, still red at 6 o'clock.  Sharp white demarated area on bilateral labia minora.Urethra Vagina:  Pink, normal rugae, small amount of brown blood from beginning  Cervix:  No CMT, no lesion  VULVAR BIOPSY NOTE  The indications for vulvar biopsy (rule out neoplasia, establish lichen sclerosus diagnosis) were reviewed.   Risks of the biopsy including pain, bleeding, infection, inadequate specimen, scarring and need for additional procedures  were discussed. The patient stated understanding and agreed to undergo procedure today. Consent was signed,  time out performed.  The patient's vulva was prepped with Betadine. 1% lidocaine was injected into fourchette and right labia minora near vagina. A 3-mm punch biopsy was done at both sites.  Tissue was picked up with sterile forceps and sterile scissors were used to excise the lesion.  Small bleeding was noted and hemostasis was achieved using silver nitrate sticks.    Of note, when the biopsy was done a 6 o'clock, once the thin layer of epithelium was removed, there was a 1/2 cm sulcus tear below.  Tissue was  very friable.  Unsure why she has not healed well in 2 week (sulcus was there 2 weeks ago).  Right labial tissue is not friable.    The patient tolerated the procedure well. Post-procedure instructions  (pelvic rest for one week) were given to the patient. The patient is to call with heavy bleeding, fever greater than 100.4, foul smelling vaginal discharge or other concerns. The patient will be return to clinic in two weeks for discussion of results.   Cultures, bd affirm, and STD testing done.

## 2014-06-08 LAB — HEPATITIS C ANTIBODY: HCV AB: NEGATIVE

## 2014-06-08 LAB — WET PREP BY MOLECULAR PROBE
Candida species: NEGATIVE
GARDNERELLA VAGINALIS: NEGATIVE
Trichomonas vaginosis: NEGATIVE

## 2014-06-08 LAB — HEPATITIS B SURFACE ANTIGEN: Hepatitis B Surface Ag: NEGATIVE

## 2014-06-08 LAB — HIV ANTIBODY (ROUTINE TESTING W REFLEX): HIV: NONREACTIVE

## 2014-06-08 LAB — RPR

## 2014-06-11 ENCOUNTER — Telehealth: Payer: Self-pay | Admitting: *Deleted

## 2014-06-11 NOTE — Telephone Encounter (Signed)
-----   Message from Guss Bunde, MD sent at 06/08/2014  2:52 PM EST ----- Call the patient and let them know their lab results are normal.  Thanks!!  STD panel, wet pretp and biopsy nml.  Will call next week and see how she is doing.

## 2014-06-11 NOTE — Telephone Encounter (Signed)
Pt called and spoke with Dr Gala Romney about labs which were all normal.

## 2014-06-14 LAB — GC/CHLAMYDIA PROBE AMP
CT Probe RNA: NEGATIVE
GC PROBE AMP APTIMA: NEGATIVE

## 2014-06-26 ENCOUNTER — Other Ambulatory Visit: Payer: Self-pay | Admitting: Family Medicine

## 2014-06-26 DIAGNOSIS — Z1231 Encounter for screening mammogram for malignant neoplasm of breast: Secondary | ICD-10-CM

## 2014-06-27 ENCOUNTER — Ambulatory Visit (INDEPENDENT_AMBULATORY_CARE_PROVIDER_SITE_OTHER): Payer: BC Managed Care – PPO

## 2014-06-27 ENCOUNTER — Ambulatory Visit (INDEPENDENT_AMBULATORY_CARE_PROVIDER_SITE_OTHER): Payer: BC Managed Care – PPO | Admitting: Obstetrics & Gynecology

## 2014-06-27 ENCOUNTER — Encounter: Payer: Self-pay | Admitting: Obstetrics & Gynecology

## 2014-06-27 VITALS — BP 115/80 | HR 89 | Resp 16 | Ht 66.0 in | Wt 238.0 lb

## 2014-06-27 DIAGNOSIS — N9089 Other specified noninflammatory disorders of vulva and perineum: Secondary | ICD-10-CM

## 2014-06-27 DIAGNOSIS — Z1231 Encounter for screening mammogram for malignant neoplasm of breast: Secondary | ICD-10-CM

## 2014-06-27 NOTE — Progress Notes (Signed)
   Subjective:    Patient ID: Kaitlyn Kirby, female    DOB: 11-14-1970, 44 y.o.   MRN: [id number]  HPI  This lovely 44 yo lady is here to have there vulva checked. She had a vulvar biopsy recently and she wants to know if it is healed enough for her to have sex. She was using latex condoms, but recently bought non-latex condoms (She hasn't used this yet.) She is still considering a BTL in the future. She is using clobetasol daily. She is also using coconut oil daily for lubrication.   Review of Systems     Objective:   Physical Exam  White epithelium just inside the labia minora/ vestibule. Biopsy site has healed.       Assessment & Plan:  Reassurance given

## 2014-07-04 ENCOUNTER — Ambulatory Visit: Payer: BC Managed Care – PPO | Admitting: Obstetrics & Gynecology

## 2014-07-18 ENCOUNTER — Ambulatory Visit (INDEPENDENT_AMBULATORY_CARE_PROVIDER_SITE_OTHER): Payer: BC Managed Care – PPO | Admitting: Obstetrics & Gynecology

## 2014-07-18 ENCOUNTER — Encounter: Payer: Self-pay | Admitting: Obstetrics & Gynecology

## 2014-07-18 VITALS — BP 107/74 | HR 76 | Resp 16 | Ht 66.0 in | Wt 250.0 lb

## 2014-07-18 DIAGNOSIS — B373 Candidiasis of vulva and vagina: Secondary | ICD-10-CM | POA: Diagnosis not present

## 2014-07-18 DIAGNOSIS — Z3009 Encounter for other general counseling and advice on contraception: Secondary | ICD-10-CM | POA: Diagnosis not present

## 2014-07-18 DIAGNOSIS — B3731 Acute candidiasis of vulva and vagina: Secondary | ICD-10-CM

## 2014-07-18 MED ORDER — FLUCONAZOLE 150 MG PO TABS: 150.0000 mg | ORAL_TABLET | Freq: Once | ORAL | Status: DC

## 2014-07-18 NOTE — Progress Notes (Signed)
   Subjective:    Patient ID: Kaitlyn Kirby, female    DOB: 06/23/1970, 44 y.o.   MRN: [id number]  HPI  44 yo SW P0 here today with 2 complaints today.  1) Her vaginal itching and burning has returned. She is using coconut oil for lubrication and Vitamin E. She is frustrated with her vagina and wants "a vagina transplant".  2) She is still interested in a BTL/BS.  Review of Systems She has been monogamous for a year. She is 964% certain that she does not want a pregnancy ever.    Objective:   Physical Exam [id number] Breathing and ambulating normally Vulva normal Vagina with lots of curdy discharge      Assessment & Plan:  Recurrent yeast- diflucan with refills Check for DM with HBA1C Hylafem Desire for BS- send [place] email to schedule this

## 2014-07-19 ENCOUNTER — Telehealth: Payer: Self-pay | Admitting: *Deleted

## 2014-07-19 LAB — WET PREP FOR TRICH, YEAST, CLUE
CLUE CELLS WET PREP: NONE SEEN
Trich, Wet Prep: NONE SEEN

## 2014-07-19 LAB — HEMOGLOBIN A1C
HEMOGLOBIN A1C: 5.4 % (ref <5.7)
MEAN PLASMA GLUCOSE: 108 mg/dL (ref <117)

## 2014-07-19 NOTE — Telephone Encounter (Signed)
Pt notified of lab results

## 2014-08-10 ENCOUNTER — Encounter (HOSPITAL_COMMUNITY): Payer: Self-pay | Admitting: General Practice

## 2014-08-24 ENCOUNTER — Encounter (HOSPITAL_COMMUNITY): Admission: RE | Payer: Self-pay | Source: Ambulatory Visit

## 2014-08-24 ENCOUNTER — Ambulatory Visit (HOSPITAL_COMMUNITY)
Admission: RE | Admit: 2014-08-24 | Payer: BC Managed Care – PPO | Source: Ambulatory Visit | Admitting: Obstetrics & Gynecology

## 2014-08-24 SURGERY — SALPINGECTOMY, BILATERAL, LAPAROSCOPIC
Anesthesia: General | Laterality: Bilateral

## 2014-08-30 ENCOUNTER — Encounter: Payer: Self-pay | Admitting: Family Medicine

## 2014-08-30 ENCOUNTER — Ambulatory Visit (INDEPENDENT_AMBULATORY_CARE_PROVIDER_SITE_OTHER): Payer: BC Managed Care – PPO | Admitting: Family Medicine

## 2014-08-30 VITALS — BP 114/72 | HR 94 | Wt 254.0 lb

## 2014-08-30 DIAGNOSIS — M779 Enthesopathy, unspecified: Secondary | ICD-10-CM

## 2014-08-30 DIAGNOSIS — F418 Other specified anxiety disorders: Secondary | ICD-10-CM

## 2014-08-30 DIAGNOSIS — M6588 Other synovitis and tenosynovitis, other site: Secondary | ICD-10-CM

## 2014-08-30 DIAGNOSIS — M778 Other enthesopathies, not elsewhere classified: Secondary | ICD-10-CM

## 2014-08-30 MED ORDER — SERTRALINE HCL 25 MG PO TABS: 25.0000 mg | ORAL_TABLET | Freq: Every day | ORAL | Status: DC

## 2014-08-30 NOTE — Progress Notes (Signed)
   Subjective:    Patient ID: Kaitlyn Kirby, female    DOB: 06-03-1970, 44 y.o.   MRN: [id number]  HPI Pain in the left hand. Pain is between the first an d2nd fingers.  Was swollen some.  Iced some.  Pain x 2 months. No known trauma. She is getting some better with NSAID and icing.  Swelling has gone down.    Here to discuss her mood. She weaned off her sertraline 3 months ago.  Says has been more irritable and anxious.  Painful with grip.  She is sleeping well.  She is stressed at work.  He constantly feels nervous and on edge.  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  Has trouble relaxing.  Says she tends to not say much until things almost explode.  She feels little interest and pleasure in doing things several days of the week.  She did eventually wean off the Q Simeon. She gained back about 12 pounds but has been working on that. She's been able to lose about half of that on her own and has been going to the gym.  Review of Systems     Objective:   Physical Exam  Constitutional: She appears well-developed and well-nourished.  HENT:  Head: Normocephalic and atraumatic.  Musculoskeletal:  Hand and fingers with NROM.  Pain with flexion of the 3rd finger against resistance.   No pain with resistance with extension.  nontender over the joint of hand or wrist.    Skin: Skin is warm and dry.  Psychiatric: She has a normal mood and affect. Her behavior is normal.          Assessment & Plan:  Depression/Anxiety - GAD-7 score of 11 and PHQ-9 score 6.  Uncontrolled. She as to restart her zoloft but wants to start at a really low dose. Also discussed could consider seeing a therapist. She will think about it. F/U in 2 months.   Flexor tendonitis - continue NSAID, ice and rest.  Call if not better in 2-3 weeks.    BMI 41-she's doing great, getting back on track with diet and exercise. Encouraged her to keep it up and continue work on it over the summer.

## 2014-12-18 ENCOUNTER — Ambulatory Visit (INDEPENDENT_AMBULATORY_CARE_PROVIDER_SITE_OTHER): Payer: BC Managed Care – PPO | Admitting: Family Medicine

## 2014-12-18 ENCOUNTER — Encounter: Payer: Self-pay | Admitting: Family Medicine

## 2014-12-18 VITALS — BP 108/74 | HR 93 | Temp 98.2°F | Ht 66.0 in | Wt 260.0 lb

## 2014-12-18 DIAGNOSIS — R635 Abnormal weight gain: Secondary | ICD-10-CM | POA: Diagnosis not present

## 2014-12-18 DIAGNOSIS — Z23 Encounter for immunization: Secondary | ICD-10-CM

## 2014-12-18 MED ORDER — PHENTERMINE-TOPIRAMATE ER 3.75-23 MG PO CP24: 1.0000 | ORAL_CAPSULE | Freq: Every morning | ORAL | Status: DC

## 2014-12-18 MED ORDER — PHENTERMINE-TOPIRAMATE ER 7.5-46 MG PO CP24: 1.0000 | ORAL_CAPSULE | Freq: Every day | ORAL | Status: DC

## 2014-12-18 NOTE — Progress Notes (Signed)
   Subjective:    Patient ID: Kaitlyn Kirby, female    DOB: 1971-02-10, 44 y.o.   MRN: [id number]  HPI She did well on the Qsymia and would like to get back on it. She had to stop it previously because of financial reasons. It was still costing her over $100 a month.  No CP or SOB.  She is having a hard time getting to the gym.  Has been trying to walk some when she can.  When volleyball ends she is having a hard time getting there.  She is not drinking soft drinks.    Review of Systems     Objective:   Physical Exam  Constitutional: She is oriented to person, place, and time. She appears well-developed and well-nourished.  HENT:  Head: Normocephalic and atraumatic.  Cardiovascular: Normal rate, regular rhythm and normal heart sounds.   Pulmonary/Chest: Effort normal and breath sounds normal.  Neurological: She is alert and oriented to person, place, and time.  Skin: Skin is warm and dry.  Psychiatric: She has a normal mood and affect. Her behavior is normal.          Assessment & Plan:  Abnormal weight gain-discussed several options. She would really like to go back on the Qsymia. She did great on it she eventually decided to stop it for financial reasons. No recent chest pain or palpitations. Blood pressure is well-controlled. Will follow-up in 6 weeks. Coupon card provided. Encouraged her to get back on track with diet and exercise we set discussed some strategies to do this.

## 2014-12-25 ENCOUNTER — Telehealth: Payer: Self-pay | Admitting: Family Medicine

## 2014-12-25 NOTE — Telephone Encounter (Signed)
Received fax for prior authorization of Qsymia 7.5 - 46mg  capsules sent through cover my meds and received approval from 11/25/2014 - 12/25/2015. Case ID [id number]. Pharmacy has been notified. - CF

## 2014-12-27 ENCOUNTER — Telehealth: Payer: Self-pay | Admitting: Family Medicine

## 2014-12-27 NOTE — Telephone Encounter (Signed)
Received fax from [place] and they have approved coverage on Qsymia from 11/25/2014 - 12/25/2015. Case Id [id number]. - CF

## 2015-02-05 ENCOUNTER — Ambulatory Visit: Payer: BC Managed Care – PPO

## 2015-02-05 ENCOUNTER — Ambulatory Visit (INDEPENDENT_AMBULATORY_CARE_PROVIDER_SITE_OTHER): Payer: BC Managed Care – PPO | Admitting: Family Medicine

## 2015-02-05 VITALS — BP 110/72 | HR 91 | Resp 16 | Wt 254.4 lb

## 2015-02-05 DIAGNOSIS — R635 Abnormal weight gain: Secondary | ICD-10-CM

## 2015-02-05 MED ORDER — PHENTERMINE-TOPIRAMATE ER 7.5-46 MG PO CP24: 1.0000 | ORAL_CAPSULE | Freq: Every day | ORAL | Status: DC

## 2015-02-05 NOTE — Progress Notes (Signed)
   Subjective:    Patient ID: Kaitlyn Kirby, female    DOB: 07-25-1970, 44 y.o.   MRN: [id number]  HPIPatient is here for blood pressure and weight check. Denies trouble sleeping, palpitations or medication problems.    Review of Systems     Objective:   Physical Exam        Assessment & Plan:  Patient has lost weight. A refill for phentermine-topiramate 7.51mb-46 mg Cp24 will be faxed to pharmacy. Patient advised to schedule a follow up with nurse/provider in 30 days. PAK   Abnormal weight gain - has lost 5.5 lbs. Med refill. She is doing great. F/U in 1 months.  Kaitlyn Lecher, MD

## 2015-03-02 ENCOUNTER — Other Ambulatory Visit: Payer: Self-pay | Admitting: Family Medicine

## 2015-03-05 ENCOUNTER — Ambulatory Visit (INDEPENDENT_AMBULATORY_CARE_PROVIDER_SITE_OTHER): Payer: BC Managed Care – PPO | Admitting: Family Medicine

## 2015-03-05 VITALS — BP 110/70 | HR 97 | Wt 253.0 lb

## 2015-03-05 DIAGNOSIS — R635 Abnormal weight gain: Secondary | ICD-10-CM

## 2015-03-05 MED ORDER — PHENTERMINE-TOPIRAMATE ER 11.25-69 MG PO CP24: 1.0000 | ORAL_CAPSULE | Freq: Every day | ORAL | Status: DC

## 2015-03-05 NOTE — Progress Notes (Signed)
Patient advised of recommendations.  

## 2015-03-05 NOTE — Progress Notes (Signed)
   Subjective:    Patient ID: Kaitlyn Kirby, female    DOB: [birth year], 44 y.o.   MRN: [id number]  HPI  Kaitlyn Kirby is here for blood pressure and weight check. Diet and exercise is going well. Denies trouble sleeping or palpitations. Patient would like to continue taking phentermine.   Review of Systems     Objective:   Physical Exam        Assessment & Plan:  Abnormal weight gain - Patient has lost 1 pound. Awaiting Dr Gardiner Ramus recommendations.   Okay to refill Qsymia at higher dose for 1 more month and she will follow-up with me at the next visit.  Beatrice Lecher, MD

## 2015-03-28 ENCOUNTER — Encounter: Payer: Self-pay | Admitting: Family Medicine

## 2015-03-28 ENCOUNTER — Ambulatory Visit: Payer: BC Managed Care – PPO | Admitting: Family Medicine

## 2015-03-28 ENCOUNTER — Ambulatory Visit (INDEPENDENT_AMBULATORY_CARE_PROVIDER_SITE_OTHER): Payer: BC Managed Care – PPO | Admitting: Family Medicine

## 2015-03-28 VITALS — BP 105/63 | HR 87 | Ht 66.0 in | Wt 251.0 lb

## 2015-03-28 DIAGNOSIS — F418 Other specified anxiety disorders: Secondary | ICD-10-CM | POA: Diagnosis not present

## 2015-03-28 DIAGNOSIS — R635 Abnormal weight gain: Secondary | ICD-10-CM | POA: Diagnosis not present

## 2015-03-28 MED ORDER — SERTRALINE HCL 50 MG PO TABS: 50.0000 mg | ORAL_TABLET | Freq: Every day | ORAL | Status: DC

## 2015-03-28 NOTE — Progress Notes (Signed)
   Subjective:    Patient ID: Kaitlyn Kirby, female    DOB: [birth year], 44 y.o.   MRN: [id number]  HPI Here for six-month follow-up for depression with anxiety-Her Back in June Decided to Restart Her Zoloft Which She Had Taken Previously. She Is Doing Well with That.  She said work is really the main source of her stressors. She feels like she worries too much about things. She also complains of some irritability several days of the week. She also still feels down several days of the week and complains of low energy more than half the days. She still struggles with overeating.  Obesity/Abnormal Weight Gain-She Has Been on Qsymia for the Last 3 Months. Last Month She Only Lost 1 Pound so we Increased Her Dose on the Qsymia to See If We Can Get More Effect. She's been tolerating that well without any side effects. No nausea, jitteriness or palpitations. No chest pain. She has been getting to the gym at least 3 days a week. She is not counting her calories but is mostly watching portion control. She's been eating 3 meals a day with 2 snacks in between.   Review of Systems     Objective:   Physical Exam  Constitutional: She is oriented to person, place, and time. She appears well-developed and well-nourished.  HENT:  Head: Normocephalic and atraumatic.  Cardiovascular: Normal rate, regular rhythm and normal heart sounds.   Pulmonary/Chest: Effort normal and breath sounds normal.  Neurological: She is alert and oriented to person, place, and time.  Skin: Skin is warm and dry.  Psychiatric: She has a normal mood and affect. Her behavior is normal.          Assessment & Plan:  Depression/anxiety- GAD-7 Score of 5 Today, PHQ 9 Score of 6.  She's not completely at therapeutic goal but close. Initially she was hesitant to adjust her medication but said she would be willing to try a higher dose. .  I will try increasing her sertraline to 50mg .    Obesity/abnormal weight gain/BMI 40 - has lost 2  more lbs in the last 3 weeks.  Doing well with inc dose of Qsymia.  Work on diet and exercise.  F/U in 1 month for nurse BP and weight check.

## 2015-03-31 HISTORY — PX: EYE SURGERY: SHX253

## 2015-04-15 ENCOUNTER — Other Ambulatory Visit: Payer: Self-pay | Admitting: Family Medicine

## 2015-04-24 ENCOUNTER — Ambulatory Visit: Payer: BC Managed Care – PPO

## 2015-05-06 ENCOUNTER — Ambulatory Visit (INDEPENDENT_AMBULATORY_CARE_PROVIDER_SITE_OTHER): Payer: BC Managed Care – PPO | Admitting: Family Medicine

## 2015-05-06 VITALS — BP 115/70 | HR 92 | Temp 98.2°F | Resp 18 | Wt 249.1 lb

## 2015-05-06 DIAGNOSIS — R635 Abnormal weight gain: Secondary | ICD-10-CM | POA: Diagnosis not present

## 2015-05-06 NOTE — Progress Notes (Signed)
   Subjective:    Patient ID: Kaitlyn Kirby, female    DOB: 04-05-1970, 45 y.o.   MRN: [id number]  HPI  Patient is here for blood pressure and weight check.  Denies sleeping, palpitations, or medication problems. She's currently on Qsymia 11.25/69 mg.    Review of Systems     Objective:   Physical Exam        Assessment & Plan:  Patient has not lost 2 or more lbs.  Patient is considering coming off the medication and wants advise to decide if this is a problem.  She has about 15 pills left before her prescription runs out.  Will await advise before making next appointment.   We can taper her back down. See if she having any side effects. If she still wants to stay on the medication then we can try pushing the dose. There is one high her strength than what she is taking. Or we can taper her off if she would like.   Beatrice Lecher, MD

## 2015-05-20 ENCOUNTER — Encounter: Payer: Self-pay | Admitting: Family Medicine

## 2015-05-20 ENCOUNTER — Ambulatory Visit (INDEPENDENT_AMBULATORY_CARE_PROVIDER_SITE_OTHER): Payer: BC Managed Care – PPO

## 2015-05-20 ENCOUNTER — Ambulatory Visit (INDEPENDENT_AMBULATORY_CARE_PROVIDER_SITE_OTHER): Payer: BC Managed Care – PPO | Admitting: Family Medicine

## 2015-05-20 VITALS — BP 117/79 | HR 94 | Temp 97.6°F | Wt 249.0 lb

## 2015-05-20 DIAGNOSIS — R051 Acute cough: Secondary | ICD-10-CM | POA: Insufficient documentation

## 2015-05-20 DIAGNOSIS — R059 Cough, unspecified: Secondary | ICD-10-CM

## 2015-05-20 DIAGNOSIS — R05 Cough: Secondary | ICD-10-CM | POA: Diagnosis not present

## 2015-05-20 MED ORDER — PREDNISONE 10 MG PO TABS: 30.0000 mg | ORAL_TABLET | Freq: Every day | ORAL | Status: DC

## 2015-05-20 MED ORDER — IPRATROPIUM BROMIDE 0.06 % NA SOLN: 2.0000 | NASAL | Status: DC | PRN

## 2015-05-20 MED ORDER — ALBUTEROL SULFATE 108 (90 BASE) MCG/ACT IN AEPB: 1.0000 | INHALATION_SPRAY | RESPIRATORY_TRACT | Status: DC | PRN

## 2015-05-20 MED ORDER — GUAIFENESIN-CODEINE 100-10 MG/5ML PO SOLN: 5.0000 mL | Freq: Every evening | ORAL | Status: DC | PRN

## 2015-05-20 NOTE — Assessment & Plan Note (Signed)
Possibly bronchitis. X-ray pending. Treat with prednisone, Atrovent nasal spray, over-the-counter Flonase nasal spray, codeine cough syrup, and albuterol. Return as needed.

## 2015-05-20 NOTE — Patient Instructions (Signed)
Thank you for coming in today. X-ray today. Use albuterol inhaler as needed. Take prednisone for 5 days. Use Atrovent nasal spray. Use over-the-counter Flonase nasal spray. Use codeine cough syrup at bedtime as needed. Return as needed.. Call or go to the emergency room if you get worse, have trouble breathing, have chest pains, or palpitations.   Acute Bronchitis Bronchitis is inflammation of the airways that extend from the windpipe into the lungs (bronchi). The inflammation often causes mucus to develop. This leads to a cough, which is the most common symptom of bronchitis.  In acute bronchitis, the condition usually develops suddenly and goes away over time, usually in a couple weeks. Smoking, allergies, and asthma can make bronchitis worse. Repeated episodes of bronchitis may cause further lung problems.  CAUSES Acute bronchitis is most often caused by the same virus that causes a cold. The virus can spread from person to person (contagious) through coughing, sneezing, and touching contaminated objects. SIGNS AND SYMPTOMS   Cough.   Fever.   Coughing up mucus.   Body aches.   Chest congestion.   Chills.   Shortness of breath.   Sore throat.  DIAGNOSIS  Acute bronchitis is usually diagnosed through a physical exam. Your health care provider will also ask you questions about your medical history. Tests, such as chest X-rays, are sometimes done to rule out other conditions.  TREATMENT  Acute bronchitis usually goes away in a couple weeks. Oftentimes, no medical treatment is necessary. Medicines are sometimes given for relief of fever or cough. Antibiotic medicines are usually not needed but may be prescribed in certain situations. In some cases, an inhaler may be recommended to help reduce shortness of breath and control the cough. A cool mist vaporizer may also be used to help thin bronchial secretions and make it easier to clear the chest.  HOME CARE  INSTRUCTIONS  Get plenty of rest.   Drink enough fluids to keep your urine clear or pale yellow (unless you have a medical condition that requires fluid restriction). Increasing fluids may help thin your respiratory secretions (sputum) and reduce chest congestion, and it will prevent dehydration.   Take medicines only as directed by your health care provider.  If you were prescribed an antibiotic medicine, finish it all even if you start to feel better.  Avoid smoking and secondhand smoke. Exposure to cigarette smoke or irritating chemicals will make bronchitis worse. If you are a smoker, consider using nicotine gum or skin patches to help control withdrawal symptoms. Quitting smoking will help your lungs heal faster.   Reduce the chances of another bout of acute bronchitis by washing your hands frequently, avoiding people with cold symptoms, and trying not to touch your hands to your mouth, nose, or eyes.   Keep all follow-up visits as directed by your health care provider.  SEEK MEDICAL CARE IF: Your symptoms do not improve after 1 week of treatment.  SEEK IMMEDIATE MEDICAL CARE IF:  You develop an increased fever or chills.   You have chest pain.   You have severe shortness of breath.  You have bloody sputum.   You develop dehydration.  You faint or repeatedly feel like you are going to pass out.  You develop repeated vomiting.  You develop a severe headache. MAKE SURE YOU:   Understand these instructions.  Will watch your condition.  Will get help right away if you are not doing well or get worse.   This information is not intended to replace  advice given to you by your health care provider. Make sure you discuss any questions you have with your health care provider.   Document Released: 04/23/2004 Document Revised: 04/06/2014 Document Reviewed: 09/06/2012 Elsevier Interactive Patient Education [employer].

## 2015-05-20 NOTE — Progress Notes (Signed)
       Kaitlyn Kirby is a 45 y.o. female who presents to [facility] today for cough and congestion. Patient has a two-week history of cough and congestion. She initially was feeling better about a week ago then acutely worsened again. She notes a productive cough that interferes with sleep. She does note some wheezing. She denies any fevers chills nausea vomiting or diarrhea. She does have a mild sore throat as well. She has tried over-the-counter medicines that have been moderately helpful.   Past Medical History  Diagnosis Date  . IBS (irritable bowel syndrome) 8-10 years ago  . Hemorrhoids   . Allergy   . Asthma     bronchial spasms  . Abnormal Pap smear 1995    Colpo  . Basal cell carcinoma 2010  . Rectal fissure    Past Surgical History  Procedure Laterality Date  . Skin cancer excision  2010    chest, basal cell  . Wisdom tooth extraction  45 years old   Social History  Substance Use Topics  . Smoking status: Former Smoker -- 0.50 packs/day    Quit date: 11/10/1995  . Smokeless tobacco: Never Used  . Alcohol Use: Yes     Comment: occasional   family history includes Diabetes in her brother; Heart attack in her father; Hypertension in her father and mother; Thyroid disease in her mother.  ROS as above Medications: Current Outpatient Prescriptions  Medication Sig Dispense Refill  . Albuterol Sulfate (PROAIR RESPICLICK) 123XX123 (90 Base) MCG/ACT AEPB Inhale 1 puff into the lungs every 4 (four) hours as needed (wheeze). 1 each 1  . cetirizine (ZYRTEC) 10 MG tablet Take 10 mg by mouth daily.    Marland Kitchen guaiFENesin-codeine 100-10 MG/5ML syrup Take 5 mLs by mouth at bedtime as needed for cough. 120 mL 0  . ibuprofen (ADVIL,MOTRIN) 200 MG tablet Take 400-600 mg by mouth every 6 (six) hours as needed for mild pain or moderate pain (depends on pain level if takes 200-600 mg).    Marland Kitchen  ipratropium (ATROVENT) 0.06 % nasal spray Place 2 sprays into both nostrils every 4 (four) hours as needed for rhinitis. 10 mL 6  . loratadine (CLARITIN) 10 MG tablet Take 10 mg by mouth daily.    . predniSONE (DELTASONE) 10 MG tablet Take 3 tablets (30 mg total) by mouth daily with breakfast. 15 tablet 0  . QSYMIA 11.25-69 MG CP24 TAKE ONE CAPSULE BY MOUTH EVERY DAY 30 capsule 0  . sertraline (ZOLOFT) 50 MG tablet Take 1 tablet (50 mg total) by mouth daily. 90 tablet 1   No current facility-administered medications for this visit.   Allergies  Allergen Reactions  . Amoxicillin Hives  . Celexa [Citalopram Hydrobromide] Other (See Comments)     Exam:  BP 117/79 mmHg  Pulse 94  Temp(Src) 97.6 F (36.4 C) (Oral)  Wt 249 lb (112.946 kg)  SpO2 100% Gen: Well NAD nontoxic appearing HEENT: EOMI,  MMM posterior pharynx cobblestoning. Normal tympanic membranes bilaterally. Lungs: Normal work of breathing. Coarse Breath sounds bilaterally. Heart: RRR no MRG Abd: NABS, Soft. Nondistended, Nontender Exts: Brisk capillary refill, warm and well perfused.   No results found for this or any previous visit (from the past 24 hour(s)). No results found.   Please see individual assessment and plan sections.

## 2015-05-21 NOTE — Progress Notes (Signed)
Quick Note:  Xray shows mild bronchitis ______

## 2015-07-22 ENCOUNTER — Other Ambulatory Visit: Payer: Self-pay | Admitting: Family Medicine

## 2015-07-22 DIAGNOSIS — Z1231 Encounter for screening mammogram for malignant neoplasm of breast: Secondary | ICD-10-CM

## 2015-08-07 ENCOUNTER — Ambulatory Visit (INDEPENDENT_AMBULATORY_CARE_PROVIDER_SITE_OTHER): Payer: BC Managed Care – PPO

## 2015-08-07 DIAGNOSIS — Z1231 Encounter for screening mammogram for malignant neoplasm of breast: Secondary | ICD-10-CM

## 2015-10-09 ENCOUNTER — Other Ambulatory Visit: Payer: Self-pay | Admitting: Family Medicine

## 2015-10-15 ENCOUNTER — Telehealth: Payer: Self-pay | Admitting: Family Medicine

## 2015-10-15 NOTE — Telephone Encounter (Signed)
Called pt and go them scheduled for there past due fu on mood. Pt is coming in on July 25th. Thanks

## 2015-10-22 ENCOUNTER — Ambulatory Visit (INDEPENDENT_AMBULATORY_CARE_PROVIDER_SITE_OTHER): Payer: BC Managed Care – PPO | Admitting: Family Medicine

## 2015-10-22 ENCOUNTER — Encounter: Payer: Self-pay | Admitting: Family Medicine

## 2015-10-22 VITALS — BP 116/72 | HR 106 | Ht 66.0 in | Wt 276.0 lb

## 2015-10-22 DIAGNOSIS — F329 Major depressive disorder, single episode, unspecified: Secondary | ICD-10-CM | POA: Diagnosis not present

## 2015-10-22 DIAGNOSIS — H8110 Benign paroxysmal vertigo, unspecified ear: Secondary | ICD-10-CM

## 2015-10-22 DIAGNOSIS — E785 Hyperlipidemia, unspecified: Secondary | ICD-10-CM | POA: Diagnosis not present

## 2015-10-22 DIAGNOSIS — M25551 Pain in right hip: Secondary | ICD-10-CM

## 2015-10-22 DIAGNOSIS — F32A Depression, unspecified: Secondary | ICD-10-CM

## 2015-10-22 NOTE — Progress Notes (Addendum)
Subjective:    CC: Follow-up depression with anxiety  HPI:  Follow-up depression with anxiety- she is doing well on her zolft.   Happy with her current regimen. She would like to continue with 50 mg. She feels like her stress levels have actually been pretty good. 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 Affects on the medication.  She does have a few skin lesions on her thighs and a skin tag under the left axilla that she would like me to look at today.  She also complains of right hip pain over the iliac crest. She's actually been seeing Dr. Romilda Garret a chiropractor for quite some time and only gets minimal relief. She wonders if physical therapy might be a good option for her. She says sometimes she can go weeks and it doesn't bother her and another time she'll just wake up with the pain.She's not currently taking any medication for the pain. She woke up with it this morning.  She still occasionally getting vertigo when she wakes up but it's infrequent and just last a couple of minutes. But it is still happening occasionally.  Past medical history, Surgical history, Family history not pertinant except as noted below, Social history, Allergies, and medications have been entered into the medical record, reviewed, and corrections made.   Review of Systems: No fevers, chills, night sweats, weight loss, chest pain, or shortness of breath.   Objective:    General: Well Developed, well nourished, and in no acute distress.  Neuro: Alert and oriented x3, extra-ocular muscles intact, sensation grossly intact.  HEENT: Normocephalic, atraumatic  Skin: Warm and dry, no rashes. Cardiac: Regular rate and rhythm, no murmurs rubs or gallops, no lower extremity edema.  Respiratory: Clear to auscultation bilaterally. Not using accessory muscles, speaking in full sentences. Back: Nontender over the lumbar spine or paraspinous muscles. Nontender over the SI joints. She is just slightly  tender over the right iliac crest. Nontender over the greater trochanter. Leg and hip with normal range of motion.   Impression and Recommendations:   Depression with anxiety-Doing well. Continue current regimen with Zoloft. Follow-up in 6 months.  PHQ 9 score of 4 and GAD 7 score of 2. She rates her symptoms as not difficult at all. She's actually doing really fantastic.  Hyperlipidemia - due to recheck lipids. Lab slip ordered  Occasional vertigo-will mail her exercises to do on her own at home. She would actually probably benefit from physical therapy for vertigo as well so we may be able to include that in with PT for her right low back pain.  Right hip pain over the iliac crest-recommend physical therapy for further evaluation and treatment. Will have her salt sports medicine if she's not improving.

## 2015-10-30 ENCOUNTER — Ambulatory Visit: Payer: BC Managed Care – PPO | Admitting: Physical Therapy

## 2015-11-06 ENCOUNTER — Ambulatory Visit (INDEPENDENT_AMBULATORY_CARE_PROVIDER_SITE_OTHER): Payer: BC Managed Care – PPO | Admitting: Rehabilitative and Restorative Service Providers"

## 2015-11-06 ENCOUNTER — Encounter: Payer: Self-pay | Admitting: Rehabilitative and Restorative Service Providers"

## 2015-11-06 DIAGNOSIS — M25551 Pain in right hip: Secondary | ICD-10-CM

## 2015-11-06 DIAGNOSIS — R531 Weakness: Secondary | ICD-10-CM | POA: Diagnosis not present

## 2015-11-06 DIAGNOSIS — R29898 Other symptoms and signs involving the musculoskeletal system: Secondary | ICD-10-CM | POA: Diagnosis not present

## 2015-11-06 NOTE — Patient Instructions (Signed)
Self massage using ~4 in rubber ball   Abdominal Bracing With Pelvic Floor (Hook-Lying)    With neutral spine, tighten pelvic floor and abdominals sucking belly button to back bone; tighten muscles in low back at waist. Hold 10 sec  Repeat _10__ times. Do _several__ times a day. Progress to do this in sitting; standing; walking and with functional activities     Trunk: Prone Extension (Press-Ups)    Lie on stomach on firm, flat surface. Relax bottom and legs. Raise chest in air with elbows straight. Keep hips flat on surface, sag stomach. Hold _2-3___ seconds. Repeat _10___ times. Do __1-2__ sessions per day. CAUTION: Movement should be gentle and slow.   HIP: Hamstrings - Supine   Place strap around foot. Raise leg up, keeping knee straight.  Bend opposite knee to protect back if indicated. Hold 30 seconds. 3 reps per set, 2-3 sets per day     Outer Hip Stretch: Reclined IT Band Stretch (Strap)   Strap around one foot, pull leg across body until you feel a pull or stretch, with shoulders on mat. Hold for 30 seconds. Repeat 3 times each leg. 2-3 times/day.  Piriformis Stretch   Lying on back, pull right knee toward opposite shoulder. Hold 30 seconds. Repeat 3 times. Do 2-3 sessions per day.    Quads / HF, Prone   Lie face down. Grasp one ankle with same-side hand. Use towel if needed to reach. Gently pull foot toward buttock.  Hold 30 seconds. Repeat 3 times per session. Do 2-3 sessions per day.   TENS UNIT: This is helpful for muscle pain and spasm.   Search and Purchase a TENS 7000 2nd edition at www.tenspros.com. It should be less than $30.     TENS unit instructions: Do not shower or bathe with the unit on Turn the unit off before removing electrodes or batteries If the electrodes lose stickiness add a drop of water to the electrodes after they are disconnected from the unit and place on plastic sheet. If you continued to have difficulty, call the  TENS unit company to purchase more electrodes. Do not apply lotion on the skin area prior to use. Make sure the skin is clean and dry as this will help prolong the life of the electrodes. After use, always check skin for unusual red areas, rash or other skin difficulties. If there are any skin problems, does not apply electrodes to the same area. Never remove the electrodes from the unit by pulling the wires. Do not use the TENS unit or electrodes other than as directed. Do not change electrode placement without consultating your therapist or physician. Keep 2 fingers with between each electrode.   Trigger Point Dry Needling  . What is Trigger Point Dry Needling (DN)? o DN is a physical therapy technique used to treat muscle pain and dysfunction. Specifically, DN helps deactivate muscle trigger points (muscle knots).  o A thin filiform needle is used to penetrate the skin and stimulate the underlying trigger point. The goal is for a local twitch response (LTR) to occur and for the trigger point to relax. No medication of any kind is injected during the procedure.   . What Does Trigger Point Dry Needling Feel Like?  o The procedure feels different for each individual patient. Some patients report that they do not actually feel the needle enter the skin and overall the process is not painful. Very mild bleeding may occur. However, many patients feel a deep cramping in  the muscle in which the needle was inserted. This is the local twitch response.   Marland Kitchen How Will I feel after the treatment? o Soreness is normal, and the onset of soreness may not occur for a few hours. Typically this soreness does not last longer than two days.  o Bruising is uncommon, however; ice can be used to decrease any possible bruising.  o In rare cases feeling tired or nauseous after the treatment is normal. In addition, your symptoms may get worse before they get better, this period will typically not last longer than 24  hours.   . What Can I do After My Treatment? o Increase your hydration by drinking more water for the next 24 hours. o You may place ice or heat on the areas treated that have become sore, however, do not use heat on inflamed or bruised areas. Heat often brings more relief post needling. o You can continue your regular activities, but vigorous activity is not recommended initially after the treatment for 24 hours. o DN is best combined with other physical therapy such as strengthening, stretching, and other therapies.
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
## 2015-11-08 ENCOUNTER — Ambulatory Visit (INDEPENDENT_AMBULATORY_CARE_PROVIDER_SITE_OTHER): Payer: BC Managed Care – PPO | Admitting: Rehabilitative and Restorative Service Providers"

## 2015-11-08 ENCOUNTER — Encounter: Payer: Self-pay | Admitting: Rehabilitative and Restorative Service Providers"

## 2015-11-08 DIAGNOSIS — R29898 Other symptoms and signs involving the musculoskeletal system: Secondary | ICD-10-CM | POA: Diagnosis not present

## 2015-11-08 DIAGNOSIS — R531 Weakness: Secondary | ICD-10-CM | POA: Diagnosis not present

## 2015-11-08 DIAGNOSIS — M25551 Pain in right hip: Secondary | ICD-10-CM | POA: Diagnosis not present

## 2015-11-08 NOTE — Therapy (Signed)
[place], [place], [address] Phone: [phone]   Fax:  [phone]  Physical Therapy Treatment  Patient Details  Name: Kaitlyn Kirby MRN: [id number] Date of Birth: [birth year] Referring Provider: Dr. Beatrice Lecher  Encounter Date: 11/08/2015      PT End of Session - 11/08/15 1136    Visit Number 2   Number of Visits 12   Date for PT Re-Evaluation 12/18/15   PT Start Time 1143   PT Stop Time 1240   PT Time Calculation (min) 57 min   Activity Tolerance Patient tolerated treatment well      Past Medical History:  Diagnosis Date  . Abnormal Pap smear 1995   Colpo  . Allergy   . Asthma    bronchial spasms  . Basal cell carcinoma 2010  . Hemorrhoids   . IBS (irritable bowel syndrome) 8-10 years ago  . Rectal fissure     Past Surgical History:  Procedure Laterality Date  . SKIN CANCER EXCISION  2010   chest, basal cell  . WISDOM TOOTH EXTRACTION  45 years old    There were no vitals filed for this visit.      Subjective Assessment - 11/08/15 1140    Subjective About the same - Kaitlyn Kirby reports that she has pain in her LB and bilat hips, primarily in the Rt hip. She has had increased pain in the last two weeks. She has had trouble with the Rt hip for about 2 years which was treated with chiropractic care and massage therapy with some improvement but she continued to have pain in the Rt hip and Lt LBP. She notices that the Rt LE is weaker than the Lt and she had diffciulty balancing on the Rt LE.    Currently in Pain? Yes   Pain Score 6    Pain Location Hip   Pain Orientation Right   Pain Descriptors / Indicators Tightness;Aching   Pain Type Chronic pain   Pain Onset More than a month ago   Pain Frequency Intermittent                         [facility] Adult PT Treatment/Exercise - 11/08/15 0001      Lumbar Exercises: Stretches   Passive Hamstring Stretch 3 reps;30  seconds   Press Ups --  2 sec hold x 10   Quad Stretch 3 reps;30 seconds   ITB Stretch 3 reps;30 seconds   Piriformis Stretch 3 reps;30 seconds  PT assist for stretch Rt piriformis - Travell    Piriformis Stretch Limitations patients size limits the stretch for piriformis      Lumbar Exercises: Aerobic   Stationary Bike Nustep L5 - 5 min      Moist Heat Therapy   Number Minutes Moist Heat 20 Minutes   Moist Heat Location Lumbar Spine;Hip  Rt     Electrical Stimulation   Electrical Stimulation Location Rt hip/piriformis    Electrical Stimulation Action IFC   Electrical Stimulation Parameters to tolerance    Electrical Stimulation Goals Pain;Tone     Manual Therapy   Manual therapy comments patient prone    Soft tissue mobilization working through the Rt hip/piriformis/hip abductors    Myofascial Release Rt hip           Trigger Point Dry Needling - 11/08/15 1228    Consent Given? Yes   Education Handout Provided Yes   Gluteus Maximus  Response Palpable increased muscle length   Gluteus Minimus Response Palpable increased muscle length   Piriformis Response Palpable increased muscle length   Tensor Fascia Lata Response Palpable increased muscle length              PT Education - 11/08/15 1142    Education provided Yes   Education Details TDN    Person(s) Educated Patient   Methods Explanation   Comprehension Verbalized understanding             PT Long Term Goals - 11/08/15 1137      PT LONG TERM GOAL #1   Title Decrease pain in the Rt hip allowing patient to progress with core stabilization; Rt LE strengthening and balance activities 12/18/15   Time 6   Period Weeks   Status On-going     PT LONG TERM GOAL #2   Title Improve tissue extensibility through Rt hip musculature demonstrated by decresaed tightness with palpation and improved stretch 12/18/15   Time 6   Period Weeks   Status On-going     PT LONG TERM GOAL #3   Title Patient to report  increased sitting; standing; walking tolerance to 45-60 min 12/18/15   Time 6   Period Weeks   Status On-going     PT LONG TERM GOAL #4   Title Independent in HEP 12/18/15   Time 6   Period Weeks   Status On-going     PT LONG TERM GOAL #5   Title Improve FOTO to </= 35% limitation 12/18/15   Time 6   Period Weeks   Status On-going               Plan - 11/08/15 1225    Clinical Impression Statement Patient tolerated TDN well and reports less tightness in the Rt hip area but does notice soreness in the TDN area. Will assess response to TDN and HEP and progress with stretching and manual work with LE strengthening. Progressing toward goals of therapy.     Rehab Potential Good   PT Frequency 2x / week   PT Duration 6 weeks   PT Treatment/Interventions Patient/family education;ADLs/Self Care Home Management;Cryotherapy;Electrical Stimulation;Iontophoresis 4mg /ml Dexamethasone;Moist Heat;Traction;Ultrasound;Manual techniques;Dry needling;Therapeutic activities;Therapeutic exercise   PT Next Visit Plan TDN v manual work through the Rt hip/Lt LB musculature; stretching; core stabilization; Rt hip strengthening and balance activities; modalities as indicated    Consulted and Agree with Plan of Care Patient      Patient will benefit from skilled therapeutic intervention in order to improve the following deficits and impairments:     Visit Diagnosis: Pain in right hip  Other symptoms and signs involving the musculoskeletal system  Weakness generalized     Problem List Patient Active Problem List   Diagnosis Date Noted  . Cough 05/20/2015  . Preventative health care 05/04/2014  . Severe obesity (BMI >= 40) ([place]) 09/14/2013  . Lichen simplex chronicus 02/03/2012  . Fissure in ano 01/02/2011  . Allergic rhinitis due to allergen 12/28/2008  . LUMBAGO 04/05/2006  . Depression 02/04/2006  . HEMORRHOIDS, EXTERNAL W/COMPLICATION NEC [id number]  . Irritable bowel syndrome with  constipation 02/04/2006    Kaitlyn Kirby Nilda Simmer PT, MPH  11/08/2015, 12:34 PM  [facility] [place], [place], [address] Phone: [phone]   Fax:  [phone]  Name: Kaitlyn Kirby MRN: [id number] Date of Birth: 1970/08/29

## 2015-11-08 NOTE — Patient Instructions (Signed)

## 2015-11-08 NOTE — Therapy (Signed)
[place], [place], [address] Phone: [phone]   Fax:  [phone]  Physical Therapy Treatment  Patient Details  Name: Kaitlyn Kirby MRN: [id number] Date of Birth: [birth year] Referring Provider: Dr. Beatrice Lecher  Encounter Date: 11/06/2015      PT End of Session - 11/08/15 1136    Visit Number 2   Number of Visits 12   Date for PT Re-Evaluation 12/18/15   PT Start Time 1143   PT Stop Time 1240   PT Time Calculation (min) 57 min   Activity Tolerance Patient tolerated treatment well      Past Medical History:  Diagnosis Date  . Abnormal Pap smear 1995   Colpo  . Allergy   . Asthma    bronchial spasms  . Basal cell carcinoma 2010  . Hemorrhoids   . IBS (irritable bowel syndrome) 8-10 years ago  . Rectal fissure     Past Surgical History:  Procedure Laterality Date  . SKIN CANCER EXCISION  2010   chest, basal cell  . WISDOM TOOTH EXTRACTION  45 years old    There were no vitals filed for this visit.      Subjective Assessment - 11/08/15 1140    Subjective About the same - Kaitlyn Kirby reports that she has pain in her LB and bilat hips, primarily in the Rt hip. She has had increased pain in the last two weeks. She has had trouble with the Rt hip for about 2 years which was treated with chiropractic care and massage therapy with some improvement but she continued to have pain in the Rt hip and Lt LBP. She notices that the Rt LE is weaker than the Lt and she had diffciulty balancing on the Rt LE.    Currently in Pain? Yes   Pain Score 6    Pain Location Hip   Pain Orientation Right   Pain Descriptors / Indicators Tightness;Aching   Pain Type Chronic pain   Pain Onset More than a month ago   Pain Frequency Intermittent                         [facility] Adult PT Treatment/Exercise - 11/08/15 0001      Lumbar Exercises: Stretches   Passive Hamstring Stretch 3 reps;30  seconds   Press Ups --  2 sec hold x 10   Quad Stretch 3 reps;30 seconds   ITB Stretch 3 reps;30 seconds   Piriformis Stretch 3 reps;30 seconds  PT assist for stretch Rt piriformis - Travell    Piriformis Stretch Limitations patients size limits the stretch for piriformis      Lumbar Exercises: Aerobic   Stationary Bike Nustep L5 - 5 min      Moist Heat Therapy   Number Minutes Moist Heat 20 Minutes   Moist Heat Location Lumbar Spine;Hip  Rt     Electrical Stimulation   Electrical Stimulation Location Rt hip/piriformis    Electrical Stimulation Action IFC   Electrical Stimulation Parameters to tolerance    Electrical Stimulation Goals Pain;Tone     Manual Therapy   Manual therapy comments patient prone    Soft tissue mobilization working through the Rt hip/piriformis/hip abductors    Myofascial Release Rt hip           Trigger Point Dry Needling - 11/08/15 1228    Consent Given? Yes   Education Handout Provided Yes   Gluteus Maximus  Response Palpable increased muscle length   Gluteus Minimus Response Palpable increased muscle length   Piriformis Response Palpable increased muscle length   Tensor Fascia Lata Response Palpable increased muscle length              PT Education - 11/08/15 1142    Education provided Yes   Education Details TDN    Person(s) Educated Patient   Methods Explanation   Comprehension Verbalized understanding             PT Long Term Goals - 11/08/15 1137      PT LONG TERM GOAL #1   Title Decrease pain in the Rt hip allowing patient to progress with core stabilization; Rt LE strengthening and balance activities 12/18/15   Time 6   Period Weeks   Status On-going     PT LONG TERM GOAL #2   Title Improve tissue extensibility through Rt hip musculature demonstrated by decresaed tightness with palpation and improved stretch 12/18/15   Time 6   Period Weeks   Status On-going     PT LONG TERM GOAL #3   Title Patient to report  increased sitting; standing; walking tolerance to 45-60 min 12/18/15   Time 6   Period Weeks   Status On-going     PT LONG TERM GOAL #4   Title Independent in HEP 12/18/15   Time 6   Period Weeks   Status On-going     PT LONG TERM GOAL #5   Title Improve FOTO to </= 35% limitation 12/18/15   Time 6   Period Weeks   Status On-going               Plan - 11/08/15 1225    Clinical Impression Statement Patient tolerated TDN well and reports less tightness in the Rt hip area but does notice soreness in the TDN area. Will assess response to TDN and HEP and progress with stretching and manual work with LE strengthening. Progressing toward goals of therapy.     Rehab Potential Good   PT Frequency 2x / week   PT Duration 6 weeks   PT Treatment/Interventions Patient/family education;ADLs/Self Care Home Management;Cryotherapy;Electrical Stimulation;Iontophoresis 4mg /ml Dexamethasone;Moist Heat;Traction;Ultrasound;Manual techniques;Dry needling;Therapeutic activities;Therapeutic exercise   PT Next Visit Plan TDN v manual work through the Rt hip/Lt LB musculature; stretching; core stabilization; Rt hip strengthening and balance activities; modalities as indicated    Consulted and Agree with Plan of Care Patient      Patient will benefit from skilled therapeutic intervention in order to improve the following deficits and impairments:  Postural dysfunction, Improper body mechanics, Pain, Increased fascial restricitons, Increased muscle spasms, Decreased strength, Decreased mobility, Decreased range of motion, Decreased activity tolerance  Visit Diagnosis: Pain in right hip - Plan: PT plan of care cert/re-cert  Other symptoms and signs involving the musculoskeletal system - Plan: PT plan of care cert/re-cert  Weakness generalized - Plan: PT plan of care cert/re-cert     Problem List Patient Active Problem List   Diagnosis Date Noted  . Cough 05/20/2015  . Preventative health care  05/04/2014  . Severe obesity (BMI >= 40) ([place]) 09/14/2013  . Lichen simplex chronicus 02/03/2012  . Fissure in ano 01/02/2011  . Allergic rhinitis due to allergen 12/28/2008  . LUMBAGO 04/05/2006  . Depression 02/04/2006  . HEMORRHOIDS, EXTERNAL W/COMPLICATION NEC [id number]  . Irritable bowel syndrome with constipation 02/04/2006    Kaitlyn Kirby Kaitlyn Kirby PT, MPH  11/08/2015, 12:29 PM  [facility] [place], [place], [address] Phone: [phone]   Fax:  [phone]  Name: Kaitlyn Kirby MRN: [id number] Date of Birth: 11/11/1970

## 2015-11-12 ENCOUNTER — Encounter: Payer: Self-pay | Admitting: Rehabilitative and Restorative Service Providers"

## 2015-11-12 ENCOUNTER — Ambulatory Visit (INDEPENDENT_AMBULATORY_CARE_PROVIDER_SITE_OTHER): Payer: BC Managed Care – PPO | Admitting: Rehabilitative and Restorative Service Providers"

## 2015-11-12 DIAGNOSIS — R531 Weakness: Secondary | ICD-10-CM

## 2015-11-12 DIAGNOSIS — M25551 Pain in right hip: Secondary | ICD-10-CM | POA: Diagnosis not present

## 2015-11-12 DIAGNOSIS — R29898 Other symptoms and signs involving the musculoskeletal system: Secondary | ICD-10-CM

## 2015-11-12 NOTE — Patient Instructions (Addendum)
Strengthening: Hip Abduction (Side-Lying)    Tighten muscles on front of left thigh, then lift leg __12__ inches from surface, keeping knee locked.  Repeat _10___ times per set. Do __1-3__ sets per session. Do __1__ sessions per day.    Strengthening: Hip Abduction - Resisted    With tubing around right leg, other side toward anchor, extend leg out from side. Repeat ___10_ times per set. Do _1-3___ sets per session. Do ___1_ sessions per day.    Strengthening: Hip Abductor - Resisted    With band looped around both legs above knees, push thighs apart. Repeat __10__ times per set. Do _1-3___ sets per session. Do __1__ sessions per day.     Strengthening: Hip Extension - Resisted    With tubing around right ankle, face anchor and pull leg straight back. Repeat __10__ times per set. Do 1-3____ sets per session. Do __1__ sessions per day.

## 2015-11-12 NOTE — Therapy (Addendum)
[place], [place], [address] Phone: [phone]   Fax:  [phone]  Physical Therapy Treatment  Patient Details  Name: PEGGI YONO MRN: [id number] Date of Birth: Nov 15, 1970 Referring Provider: Dr. Beatrice Lecher  Encounter Date: 11/12/2015      PT End of Session - 11/12/15 0808    Visit Number 3   Number of Visits 12   Date for PT Re-Evaluation 12/18/15   PT Start Time 0806   PT Stop Time 0900   PT Time Calculation (min) 54 min   Activity Tolerance Patient tolerated treatment well      Past Medical History:  Diagnosis Date  . Abnormal Pap smear 1995   Colpo  . Allergy   . Asthma    bronchial spasms  . Basal cell carcinoma 2010  . Hemorrhoids   . IBS (irritable bowel syndrome) 8-10 years ago  . Rectal fissure     Past Surgical History:  Procedure Laterality Date  . SKIN CANCER EXCISION  2010   chest, basal cell  . WISDOM TOOTH EXTRACTION  45 years old    There were no vitals filed for this visit.      Subjective Assessment - 11/12/15 0808    Subjective Sore for about a day after the TDN but then felt much better. Still has a little soreness and tightness but pain is much better  Will awaken with discomfort when she lies on her Rt side. Working to correct ER positions.    Currently in Pain? Yes   Pain Score 3    Pain Location Hip   Pain Orientation Right   Pain Descriptors / Indicators Tightness   Pain Type Chronic pain   Pain Onset More than a month ago   Pain Frequency Intermittent                         [facility] Adult PT Treatment/Exercise - 11/12/15 0001      Lumbar Exercises: Stretches   Passive Hamstring Stretch 3 reps;30 seconds   Press Ups --  2 sec hold x 10   Quad Stretch 3 reps;30 seconds   ITB Stretch 3 reps;30 seconds   Piriformis Stretch 3 reps;30 seconds  PT assist for stretch Rt piriformis - Travell    Piriformis Stretch  Limitations patients size limits the stretch for piriformis      Lumbar Exercises: Aerobic   Stationary Bike Nustep L5 - 5 min      Lumbar Exercises: Standing   Other Standing Lumbar Exercises hip abduction x 10 hip ext x 10      Lumbar Exercises: Sidelying   Clam 10 reps   Hip Abduction 10 reps  added tap fwd/back x 10      Moist Heat Therapy   Number Minutes Moist Heat 20 Minutes   Moist Heat Location Lumbar Spine;Hip  Rt     Electrical Stimulation   Electrical Stimulation Location Rt hip/piriformis    Electrical Stimulation Action IFC   Electrical Stimulation Parameters to tolerance   Electrical Stimulation Goals Pain;Tone     Manual Therapy   Manual therapy comments patient prone    Soft tissue mobilization working through the Rt hip/piriformis/hip abductors    Myofascial Release Rt hip                 PT Education - 11/12/15 0830    Education provided Yes   Education Details HEP  Person(s) Educated Patient   Methods Explanation;Demonstration;Tactile cues;Verbal cues;Handout   Comprehension Verbalized understanding;Returned demonstration;Verbal cues required;Tactile cues required             PT Long Term Goals - 11/12/15 0813      PT LONG TERM GOAL #1   Title Decrease pain in the Rt hip allowing patient to progress with core stabilization; Rt LE strengthening and balance activities 12/18/15   Time 6   Period Weeks   Status On-going     PT LONG TERM GOAL #2   Title Improve tissue extensibility through Rt hip musculature demonstrated by decresaed tightness with palpation and improved stretch 12/18/15   Time 6   Period Weeks   Status On-going     PT LONG TERM GOAL #3   Title Patient to report increased sitting; standing; walking tolerance to 45-60 min 12/18/15   Time 6   Period Weeks   Status On-going     PT LONG TERM GOAL #4   Title Independent in HEP 12/18/15   Time 6   Period Weeks   Status On-going     PT LONG TERM GOAL #5   Title  Improve FOTO to </= 35% limitation 12/18/15   Time 6   Period Weeks   Status On-going               Plan - 11/12/15 0810    Clinical Impression Statement Excellent response to TDN with good resolution of pain. She is working on her stretching and correcting posture and alignment. Progressing well toward stated goals of therapy.    Rehab Potential Good   PT Frequency 2x / week   PT Duration 6 weeks   PT Treatment/Interventions Patient/family education;ADLs/Self Care Home Management;Cryotherapy;Electrical Stimulation;Iontophoresis 52m/ml Dexamethasone;Moist Heat;Traction;Ultrasound;Manual techniques;Dry needling;Therapeutic activities;Therapeutic exercise   PT Next Visit Plan TDN v manual work through the Rt hip/Lt LB musculature; stretching; core stabilization; Rt hip strengthening and balance activities; modalities as indicated    Consulted and Agree with Plan of Care Patient      Patient will benefit from skilled therapeutic intervention in order to improve the following deficits and impairments:  Postural dysfunction, Improper body mechanics, Pain, Increased fascial restricitons, Increased muscle spasms, Decreased strength, Decreased mobility, Decreased range of motion, Decreased activity tolerance  Visit Diagnosis: Pain in right hip  Other symptoms and signs involving the musculoskeletal system  Weakness generalized     Problem List Patient Active Problem List   Diagnosis Date Noted  . Cough 05/20/2015  . Preventative health care 05/04/2014  . Severe obesity (BMI >= 40) ([place] 09/14/2013  . Lichen simplex chronicus 02/03/2012  . Fissure in ano 01/02/2011  . Allergic rhinitis due to allergen 12/28/2008  . LUMBAGO 04/05/2006  . Depression 02/04/2006  . HEMORRHOIDS, EXTERNAL W/COMPLICATION NEC 152/84/1324 . Irritable bowel syndrome with constipation 02/04/2006    Anjolina Byrer PNilda SimmerPT, MPH 11/12/2015, 8:42 AM  [facility] [address] [place] [address]Phone: [phone]  Fax:  [phone] Name: LERINA HAMMEMRN: [id number] of Birth: 108-02-1971  PHYSICAL THERAPY DISCHARGE SUMMARY  Visits from Start of Care: 3  Current functional level related to goals / functional outcomes: See last note for PT discharge status   Remaining deficits: unknown   Education / Equipment: HEP Plan: Patient agrees to discharge.  Patient goals were partially met. Patient is being discharged due to not returning since the last visit.  ?????  Makaria Poarch P. Helene Kelp PT, MPH 02/24/16 9:46 AM

## 2015-11-19 ENCOUNTER — Encounter: Payer: BC Managed Care – PPO | Admitting: Physical Therapy

## 2016-01-07 ENCOUNTER — Other Ambulatory Visit: Payer: Self-pay | Admitting: Family Medicine

## 2016-03-02 ENCOUNTER — Other Ambulatory Visit: Payer: Self-pay | Admitting: Ophthalmology

## 2016-04-23 ENCOUNTER — Ambulatory Visit (INDEPENDENT_AMBULATORY_CARE_PROVIDER_SITE_OTHER): Payer: BC Managed Care – PPO | Admitting: Family Medicine

## 2016-04-23 ENCOUNTER — Other Ambulatory Visit: Payer: Self-pay | Admitting: Family Medicine

## 2016-04-23 ENCOUNTER — Encounter: Payer: Self-pay | Admitting: Family Medicine

## 2016-04-23 DIAGNOSIS — L821 Other seborrheic keratosis: Secondary | ICD-10-CM | POA: Diagnosis not present

## 2016-04-23 DIAGNOSIS — L918 Other hypertrophic disorders of the skin: Secondary | ICD-10-CM | POA: Diagnosis not present

## 2016-04-23 DIAGNOSIS — F419 Anxiety disorder, unspecified: Secondary | ICD-10-CM | POA: Diagnosis not present

## 2016-04-23 MED ORDER — SERTRALINE HCL 50 MG PO TABS: ORAL_TABLET | ORAL | 2 refills | Status: DC

## 2016-04-23 NOTE — Progress Notes (Signed)
Subjective:    Patient ID: Kaitlyn Kirby, female    DOB: 1970-10-16, 46 y.o.   MRN: [id number]  HPI F/U depression/anxiety - she is currently zoloft.  He is doing well on the medication overall. No side effects. She says she still wonders if her home hormones could be affecting her mood to some degree. She was on birth control years ago and unfortunately got a rash around her mouth, likely melasma.  She also has some skin tags around her neck that she would like to have removed today.  She also has some larger raised seborrheic keratoses on her legs that get irritated and Her Clothing That She Would like to Have Removed Today As Well.   Review of Systems     Objective:   Physical Exam  Constitutional: She is oriented to person, place, and time. She appears well-developed and well-nourished.  HENT:  Head: Normocephalic and atraumatic.  Eyes: Conjunctivae and EOM are normal.  Cardiovascular: Normal rate.   Pulmonary/Chest: Effort normal.  Neurological: She is alert and oriented to person, place, and time.  Skin: Skin is dry. No pallor.  She has several small skin tags around the crease in her neck. And under both axilla. She has several larger approximately half centimeter seborrheic keratoses that are raised and thick on her thighs and left calf  Psychiatric: She has a normal mood and affect. Her behavior is normal.  Vitals reviewed.         Assessment & Plan:  Depression/Anxiety -  Continue current regimen. Overall she is doing well. PHQ 9 score of 5 and GAD 7 score of 2 today. Refills sent to pharmacy. Follow-up in 6 months. Next  Skin tags-clipped today. Next  Seborrheic keratoses-cryotherapy performed. Patient tolerated well.  Cryotherapy Procedure Note  Pre-operative Diagnosis: Seborrheic keratosis  Post-operative Diagnosis: same  Locations: She had 4 lesions on her right upper thigh, 2 on her left upper thigh, and one on her left calf  Indications:  [place]  Procedure Details  Patient informed of risks (permanent scarring, infection, light or dark discoloration, bleeding, infection, weakness, numbness and recurrence of the lesion) and benefits of the procedure and verbal informed consent obtained.  The areas are treated with liquid nitrogen therapy, frozen until ice ball extended 2-3 mm beyond lesion, allowed to thaw, and treated again. The patient tolerated procedure well.  The patient was instructed on post-op care, warned that there may be blister formation, redness and pain. Recommend OTC analgesia as needed for pain.  Condition: Stable  Complications: none.  Plan: 1. Instructed to keep the area dry and covered for 24-48h and clean thereafter. 2. Warning signs of infection were reviewed.   3. Recommended that the patient use OTC acetaminophen as needed for pain.  4. Return PRN.   Skin Tag Removal Procedure Note  Pre-operative Diagnosis: Classic skin tags (acrochordon)  Post-operative Diagnosis: Classic skin tags (acrochordon)  Locations:neck , removed 8 lesion  Indications: irritation  Anesthesia: not required without added sodium bicarbonate  Procedure Details  The risks (including bleeding and infection) and benefits of the procedure and Verbal informed consent obtained. Using sterile iris scissors, multiple skin tags were snipped off at their bases after cleansing with alcohol.  Bleeding was controlled by pressure.   Findings: Pathognomonic benign lesions  not sent for pathological exam.  Condition: Stable  Complications: none.  Plan: 1. Instructed to keep the wounds dry and covered for 24-48h and clean thereafter. 2. Warning signs of infection were reviewed.  3. Recommended that the patient use OTC acetaminophen as needed for pain.  4. Return as needed.

## 2016-04-24 ENCOUNTER — Other Ambulatory Visit: Payer: Self-pay | Admitting: Family Medicine

## 2016-05-01 ENCOUNTER — Other Ambulatory Visit: Payer: Self-pay | Admitting: Family Medicine

## 2016-07-06 NOTE — Progress Notes (Signed)
Subjective:    Patient ID: Kaitlyn Kirby, female    DOB: 04-04-1970, 46 y.o.   MRN: [id number]  HPI 46 year old female comes in today with cold symptoms for about 2 weeks. She's had some nasal congestion, sore throat. Chills and sweats but has not actually checked her temperature. Over the last week she's developed a productive cough with brown sputum. She just felt very fatigued and has felt even a little short of breath with activities. She also feels like the right side of her neck is a little bit swollen. He does complain of facial pain above her right eye.  She's also had some urinary frequency and urgency but little bit discomfort for 3 or 4 weeks. No hematuria. No known fevers. No back pain.   Review of Systems  BP 137/76   Pulse 78   Wt 285 lb (129.3 kg)   BMI 46.00 kg/m     Allergies  Allergen Reactions  . Amoxicillin Hives  . Celexa [Citalopram Hydrobromide] Other (See Comments)    Past Medical History:  Diagnosis Date  . Abnormal Pap smear 1995   Colpo  . Allergy   . Asthma    bronchial spasms  . Basal cell carcinoma 2010  . Hemorrhoids   . IBS (irritable bowel syndrome) 8-10 years ago  . Rectal fissure     Past Surgical History:  Procedure Laterality Date  . SKIN CANCER EXCISION  2010   chest, basal cell  . WISDOM TOOTH EXTRACTION  46 years old    Social History   Social History  . Marital status: Single    Spouse name: N/A  . Number of children: N/A  . Years of education: N/A   Occupational History  . Not on file.   Social History Main Topics  . Smoking status: Former Smoker    Packs/day: 0.50    Quit date: 11/10/1995  . Smokeless tobacco: Never Used  . Alcohol use Yes     Comment: occasional  . Drug use: No  . Sexual activity: No   Other Topics Concern  . Not on file   Social History Narrative  . No narrative on file    Family History  Problem Relation Age of Onset  . Hypertension Mother   . Hypertension Father   . Heart  attack Father   . Diabetes Brother   . Thyroid disease Mother     Outpatient Encounter Prescriptions as of 07/07/2016  Medication Sig  . Albuterol Sulfate (PROAIR RESPICLICK) 591 (90 Base) MCG/ACT AEPB Inhale 1 puff into the lungs every 4 (four) hours as needed (wheeze).  . cetirizine (ZYRTEC) 10 MG tablet Take 10 mg by mouth daily.  . fluticasone (FLONASE) 50 MCG/ACT nasal spray Place into both nostrils daily.  . sertraline (ZOLOFT) 50 MG tablet TAKE 1 TABLET BY MOUTH EVERY DAY.  . [DISCONTINUED] Albuterol Sulfate (PROAIR RESPICLICK) 638 (90 Base) MCG/ACT AEPB Inhale 1 puff into the lungs every 4 (four) hours as needed (wheeze).  Marland Kitchen sulfamethoxazole-trimethoprim (BACTRIM DS,SEPTRA DS) 800-160 MG tablet Take 1 tablet by mouth 2 (two) times daily.   No facility-administered encounter medications on file as of 07/07/2016.          Objective:   Physical Exam  Constitutional: She is oriented to person, place, and time. She appears well-developed and well-nourished.  HENT:  Head: Normocephalic and atraumatic.  Right Ear: External ear normal.  Left Ear: External ear normal.  Nose: Nose normal.  Mouth/Throat: Oropharynx is clear and  moist.  TMs and canals are clear.   Eyes: Conjunctivae and EOM are normal. Pupils are equal, round, and reactive to light.  Neck: Neck supple. No thyromegaly present.  Cardiovascular: Normal rate, regular rhythm and normal heart sounds.   Pulmonary/Chest: Effort normal and breath sounds normal. She has no wheezes.  Lymphadenopathy:    She has no cervical adenopathy.  Neurological: She is alert and oriented to person, place, and time.  Skin: Skin is warm and dry.  Psychiatric: She has a normal mood and affect.        Assessment & Plan:  Acute sinusitis with bronchitis-we'll go ahead and treat with Bactrim she is penicillin allergic. This will also cover for urinary tract infection.  Urinary tract infection-she did have blood on the urinalysis. We'll go  ahead and treat with oral Bactrim. Call if not significantly better in one week. Will send for culture.

## 2016-07-07 ENCOUNTER — Ambulatory Visit (INDEPENDENT_AMBULATORY_CARE_PROVIDER_SITE_OTHER): Payer: BC Managed Care – PPO | Admitting: Family Medicine

## 2016-07-07 VITALS — BP 137/76 | HR 78 | Wt 285.0 lb

## 2016-07-07 DIAGNOSIS — J0110 Acute frontal sinusitis, unspecified: Secondary | ICD-10-CM | POA: Diagnosis not present

## 2016-07-07 DIAGNOSIS — R3915 Urgency of urination: Secondary | ICD-10-CM | POA: Diagnosis not present

## 2016-07-07 DIAGNOSIS — N3000 Acute cystitis without hematuria: Secondary | ICD-10-CM | POA: Diagnosis not present

## 2016-07-07 DIAGNOSIS — R05 Cough: Secondary | ICD-10-CM

## 2016-07-07 DIAGNOSIS — R059 Cough, unspecified: Secondary | ICD-10-CM

## 2016-07-07 LAB — POCT URINALYSIS DIPSTICK
BILIRUBIN UA: NEGATIVE
Glucose, UA: NEGATIVE
KETONES UA: NEGATIVE
Leukocytes, UA: NEGATIVE
Nitrite, UA: NEGATIVE
PH UA: 5.5 (ref 5.0–8.0)
PROTEIN UA: NEGATIVE
Spec Grav, UA: 1.025 (ref 1.030–1.035)
Urobilinogen, UA: 0.2 (ref ?–2.0)

## 2016-07-07 MED ORDER — ALBUTEROL SULFATE 108 (90 BASE) MCG/ACT IN AEPB: 1.0000 | INHALATION_SPRAY | RESPIRATORY_TRACT | 1 refills | Status: DC | PRN

## 2016-07-07 MED ORDER — SULFAMETHOXAZOLE-TRIMETHOPRIM 800-160 MG PO TABS: 1.0000 | ORAL_TABLET | Freq: Two times a day (BID) | ORAL | 0 refills | Status: DC

## 2016-07-07 NOTE — Patient Instructions (Addendum)
Sinusitis, Adult Sinusitis is soreness and inflammation of your sinuses. Sinuses are hollow spaces in the bones around your face. They are located:  Around your eyes.  In the middle of your forehead.  Behind your nose.  In your cheekbones. Your sinuses and nasal passages are lined with a stringy fluid (mucus). Mucus normally drains out of your sinuses. When your nasal tissues get inflamed or swollen, the mucus can get trapped or blocked so air cannot flow through your sinuses. This lets bacteria, viruses, and funguses grow, and that leads to infection. Follow these instructions at home: Medicines   Take, use, or apply over-the-counter and prescription medicines only as told by your doctor. These may include nasal sprays.  If you were prescribed an antibiotic medicine, take it as told by your doctor. Do not stop taking the antibiotic even if you start to feel better. Hydrate and Humidify   Drink enough water to keep your pee (urine) clear or pale yellow.  Use a cool mist humidifier to keep the humidity level in your home above 50%.  Breathe in steam for 10-15 minutes, 3-4 times a day or as told by your doctor. You can do this in the bathroom while a hot shower is running.  Try not to spend time in cool or dry air. Rest   Rest as much as possible.  Sleep with your head raised (elevated).  Make sure to get enough sleep each night. General instructions   Put a warm, moist washcloth on your face 3-4 times a day or as told by your doctor. This will help with discomfort.  Wash your hands often with soap and water. If there is no soap and water, use hand sanitizer.  Do not smoke. Avoid being around people who are smoking (secondhand smoke).  Keep all follow-up visits as told by your doctor. This is important. Contact a doctor if:  You have a fever.  Your symptoms get worse.  Your symptoms do not get better within 10 days. Get help right away if:  You have a very bad  headache.  You cannot stop throwing up (vomiting).  You have pain or swelling around your face or eyes.  You have trouble seeing.  You feel confused.  Your neck is stiff.  You have trouble breathing. This information is not intended to replace advice given to you by your health care provider. Make sure you discuss any questions you have with your health care provider. Document Released: 09/02/2007 Document Revised: 11/10/2015 Document Reviewed: 01/09/2015 Elsevier Interactive Patient Education  2017 [employer].  Urinary Tract Infection, Adult A urinary tract infection (UTI) is an infection of any part of the urinary tract. The urinary tract includes the:  Kidneys.  Ureters.  Bladder.  Urethra. These organs make, store, and get rid of pee (urine) in the body. Follow these instructions at home:  Take over-the-counter and prescription medicines only as told by your doctor.  If you were prescribed an antibiotic medicine, take it as told by your doctor. Do not stop taking the antibiotic even if you start to feel better.  Avoid the following drinks:  Alcohol.  Caffeine.  Tea.  Carbonated drinks.  Drink enough fluid to keep your pee clear or pale yellow.  Keep all follow-up visits as told by your doctor. This is important.  Make sure to:  Empty your bladder often and completely. Do not to hold pee for long periods of time.  Empty your bladder before and after sex.  Wipe from front to back after a bowel movement if you are female. Use each tissue one time when you wipe. Contact a doctor if:  You have back pain.  You have a fever.  You feel sick to your stomach (nauseous).  You throw up (vomit).  Your symptoms do not get better after 3 days.  Your symptoms go away and then come back. Get help right away if:  You have very bad back pain.  You have very bad lower belly (abdominal) pain.  You are throwing up and cannot keep down any medicines or  water. This information is not intended to replace advice given to you by your health care provider. Make sure you discuss any questions you have with your health care provider. Document Released: 09/02/2007 Document Revised: 08/22/2015 Document Reviewed: 02/04/2015 Elsevier Interactive Patient Education  2017 [employer].

## 2016-07-08 LAB — URINE CULTURE: ORGANISM ID, BACTERIA: NO GROWTH

## 2016-07-20 ENCOUNTER — Ambulatory Visit (INDEPENDENT_AMBULATORY_CARE_PROVIDER_SITE_OTHER): Payer: BC Managed Care – PPO | Admitting: Obstetrics & Gynecology

## 2016-07-20 ENCOUNTER — Encounter: Payer: Self-pay | Admitting: Obstetrics & Gynecology

## 2016-07-20 VITALS — BP 118/88 | HR 98 | Ht 66.0 in | Wt 286.0 lb

## 2016-07-20 DIAGNOSIS — N898 Other specified noninflammatory disorders of vagina: Secondary | ICD-10-CM | POA: Diagnosis not present

## 2016-07-20 DIAGNOSIS — Z113 Encounter for screening for infections with a predominantly sexual mode of transmission: Secondary | ICD-10-CM | POA: Diagnosis not present

## 2016-07-20 NOTE — Progress Notes (Signed)
   Subjective:    Patient ID: Kaitlyn Kirby, female    DOB: Jul 06, 1970, 46 y.o.   MRN: [id number]  HPI  Pt c/o things not "feeling right" in her vagina.  There was an odor associated with it.  She has had BV ithe past.  She is not having itching.  Pt had Josph Macho since I last saw her.  This helped her dyspareunia and she no longer tears with intercourse.  Pt is due for annual exam and will come this summer.  Pt was recently on antibiotics for sinus infection.  Review of Systems  Constitutional: Negative.   Respiratory: Negative.   Cardiovascular: Negative.   Gastrointestinal: Negative.   Genitourinary: Positive for vaginal discharge and vaginal pain. Negative for decreased urine volume, genital sores, pelvic pain and urgency.   Vitals:   07/20/16 1322  BP: 118/88  Pulse: 98  Weight: 286 lb (129.7 kg)  Height: 5\' 6"  (1.676 m)       Objective:   Physical Exam  Constitutional: She is oriented to person, place, and time. She appears well-developed and well-nourished. No distress.  HENT:  Head: Normocephalic and atraumatic.  Eyes: Conjunctivae are normal.  Pulmonary/Chest: Effort normal.  Abdominal: Soft. Bowel sounds are normal. She exhibits no distension and no mass. There is no tenderness. There is no rebound and no guarding.  Genitourinary:  Genitourinary Comments: Nml vulva Introitus is red at 5 & 7 pm.  Non tender.  Not friable.   Cervix:  Non tender Vagina:  Very little discharge.  Musculoskeletal: She exhibits no edema.  Neurological: She is alert and oriented to person, place, and time.  Skin: Skin is warm and dry.  Psychiatric: She has a normal mood and affect.  Vitals reviewed.   Assessment & Plan:  46 yo female with vaginal odor nad discomfort  1-Red areas of introitus could be from vaginitis.  Will evaluate again at yearly exam.  Not friable which is very bi improvement before Josph Macho 2-Hgb A1C (was bordeline elevated 2 years ago--has gained 30 pounds. 3-Test  for vaginitis.  Declines Gc. Chlamydia.

## 2016-07-21 LAB — HEMOGLOBIN A1C
Hgb A1c MFr Bld: 5.4 % (ref <5.7)
MEAN PLASMA GLUCOSE: 108 mg/dL

## 2016-07-22 LAB — CERVICOVAGINAL ANCILLARY ONLY
Bacterial vaginitis: NEGATIVE
CANDIDA VAGINITIS: POSITIVE — AB
Trichomonas: NEGATIVE

## 2016-07-28 ENCOUNTER — Encounter: Payer: Self-pay | Admitting: Obstetrics & Gynecology

## 2016-07-28 DIAGNOSIS — B379 Candidiasis, unspecified: Secondary | ICD-10-CM | POA: Insufficient documentation

## 2016-07-29 ENCOUNTER — Telehealth: Payer: Self-pay

## 2016-07-29 NOTE — Telephone Encounter (Signed)
error 

## 2016-07-30 ENCOUNTER — Telehealth: Payer: Self-pay | Admitting: *Deleted

## 2016-07-30 DIAGNOSIS — B9689 Other specified bacterial agents as the cause of diseases classified elsewhere: Secondary | ICD-10-CM

## 2016-07-30 DIAGNOSIS — N760 Acute vaginitis: Principal | ICD-10-CM

## 2016-07-30 MED ORDER — AMBULATORY NON FORMULARY MEDICATION: 1.0000 | Freq: Every day | 0 refills | Status: DC

## 2016-07-30 NOTE — Telephone Encounter (Signed)
Pt notified of Aptima results and RX for Boric Acid sent to [facility]

## 2016-07-30 NOTE — Telephone Encounter (Signed)
Pt notified of Aptima results.  Per VO Dr Gala Romney has recommended Boric Acid 600 mg night per vagina for 2 weeks.  Her Hgb A1C is within normal range and stable.  Boric Acid was sent to [facility]

## 2016-07-30 NOTE — Telephone Encounter (Signed)
-----   Message from Guss Bunde, MD sent at 07/28/2016  8:21 PM EDT ----- Candida glabrata--intravaginal boric acid (600 mg capsule once daily at night for two weeks); Hgb Aic stable and not in diabetic range.  Weight loss encouraged

## 2016-10-07 ENCOUNTER — Other Ambulatory Visit: Payer: Self-pay | Admitting: Family Medicine

## 2016-10-07 DIAGNOSIS — Z1239 Encounter for other screening for malignant neoplasm of breast: Secondary | ICD-10-CM

## 2016-10-13 ENCOUNTER — Ambulatory Visit (INDEPENDENT_AMBULATORY_CARE_PROVIDER_SITE_OTHER): Payer: BC Managed Care – PPO | Admitting: Family Medicine

## 2016-10-13 ENCOUNTER — Ambulatory Visit (INDEPENDENT_AMBULATORY_CARE_PROVIDER_SITE_OTHER): Payer: BC Managed Care – PPO

## 2016-10-13 ENCOUNTER — Encounter: Payer: Self-pay | Admitting: Family Medicine

## 2016-10-13 VITALS — BP 126/82 | HR 100 | Ht 66.0 in | Wt 287.0 lb

## 2016-10-13 DIAGNOSIS — L989 Disorder of the skin and subcutaneous tissue, unspecified: Secondary | ICD-10-CM

## 2016-10-13 DIAGNOSIS — R221 Localized swelling, mass and lump, neck: Secondary | ICD-10-CM

## 2016-10-13 DIAGNOSIS — L918 Other hypertrophic disorders of the skin: Secondary | ICD-10-CM | POA: Diagnosis not present

## 2016-10-13 DIAGNOSIS — F3341 Major depressive disorder, recurrent, in partial remission: Secondary | ICD-10-CM | POA: Diagnosis not present

## 2016-10-13 DIAGNOSIS — Z1231 Encounter for screening mammogram for malignant neoplasm of breast: Secondary | ICD-10-CM

## 2016-10-13 DIAGNOSIS — Z1239 Encounter for other screening for malignant neoplasm of breast: Secondary | ICD-10-CM

## 2016-10-13 MED ORDER — SERTRALINE HCL 100 MG PO TABS: ORAL_TABLET | ORAL | 1 refills | Status: DC

## 2016-10-13 NOTE — Progress Notes (Signed)
Subjective:    Patient ID: Kaitlyn Kirby, female    DOB: 08-14-1970, 46 y.o.   MRN: [id number]  HPI  66 are female comes in today to follow-up for depression-she's currently on Zoloft. She says she recently went to the beach with family and they sat down with her and encouraged her to f/u for her mood.  They feel she has been more withdrawn.  She thought maybe it was just because of the weight gain over the last year that she been more fatigued and so didn't want to be as active and do things. Now she is wondering if it really could be that her mood is not well controlled.  She also had a couple skin lesions she wants me to look at today. One is on her inner left forearm. She says that been there for at least a month it's very itchy. It really hasn't changed in appearance or gotten worse. She denies any known contact with poison ivy etc. She also has a lesion near the tip of her nose it's been there for well over 6 months. She thought initially was just a little dry scab. It has stayed more hard and crusty. She has had a prior history of squamous cell skin cancer and does have an appointment with her dermatologist in September.  Right sided neck swellowing for > 1 months.  Last week she felt like a hard time swallowing last week but that has resolved. No pain on injury.     Review of Systems   BP 126/82   Pulse 100   Ht 5\' 6"  (1.676 m)   Wt 287 lb (130.2 kg)   BMI 46.32 kg/m     Allergies  Allergen Reactions  . Amoxicillin Hives  . Celexa [Citalopram Hydrobromide] Other (See Comments)    Past Medical History:  Diagnosis Date  . Abnormal Pap smear 1995   Colpo  . Allergy   . Asthma    bronchial spasms  . Basal cell carcinoma 2010  . Hemorrhoids   . IBS (irritable bowel syndrome) 8-10 years ago  . Rectal fissure     Past Surgical History:  Procedure Laterality Date  . EYE SURGERY    . SKIN CANCER EXCISION  2010   chest, basal cell  . WISDOM TOOTH EXTRACTION  46 years old     Social History   Social History  . Marital status: Single    Spouse name: N/A  . Number of children: N/A  . Years of education: N/A   Occupational History  . Not on file.   Social History Main Topics  . Smoking status: Former Smoker    Packs/day: 0.50    Quit date: 11/10/1995  . Smokeless tobacco: Never Used  . Alcohol use Yes     Comment: occasional  . Drug use: No  . Sexual activity: Yes    Partners: Male    Birth control/ protection: Condom   Other Topics Concern  . Not on file   Social History Narrative  . No narrative on file    Family History  Problem Relation Age of Onset  . Hypertension Mother   . Thyroid disease Mother   . Hypertension Father   . Heart attack Father   . Diabetes Brother     Outpatient Encounter Prescriptions as of 10/13/2016  Medication Sig  . Albuterol Sulfate (PROAIR RESPICLICK) 419 (90 Base) MCG/ACT AEPB Inhale 1 puff into the lungs every 4 (four) hours as needed (wheeze).  Marland Kitchen  AMBULATORY NON FORMULARY MEDICATION Place 1 capsule vaginally at bedtime. Boric Acid capsules 600 Mg vaginally @ Bedtime for 2 weeks  . cetirizine (ZYRTEC) 10 MG tablet Take 10 mg by mouth daily.  . fluticasone (FLONASE) 50 MCG/ACT nasal spray Place into both nostrils daily.  . sertraline (ZOLOFT) 100 MG tablet TAKE 1 TABLET BY MOUTH EVERY DAY.  . [DISCONTINUED] sertraline (ZOLOFT) 50 MG tablet TAKE 1 TABLET BY MOUTH EVERY DAY.  . [DISCONTINUED] sulfamethoxazole-trimethoprim (BACTRIM DS,SEPTRA DS) 800-160 MG tablet Take 1 tablet by mouth 2 (two) times daily. (Patient not taking: Reported on 07/20/2016)   No facility-administered encounter medications on file as of 10/13/2016.           Objective:   Physical Exam  Constitutional: She is oriented to person, place, and time. She appears well-developed and well-nourished.  HENT:  Head: Normocephalic and atraumatic.  Eyes: Pupils are equal, round, and reactive to light. Conjunctivae are normal.  Neck: Neck  supple. No thyromegaly present.  There is just some slight fullness just above the clavicle on the right side the anterior neck. Do not palpate any masses or lesions though.  Cardiovascular: Normal rate, regular rhythm and normal heart sounds.   Pulmonary/Chest: Effort normal and breath sounds normal.  Lymphadenopathy:    She has no cervical adenopathy.  Neurological: She is alert and oriented to person, place, and time.  Skin: Skin is warm and dry.  Psychiatric: She has a normal mood and affect. Her behavior is normal.          Assessment & Plan:  Depression - Uncontrolled. PHQ 9 score of 14 today. Uncontrolled. We discussed options. I like to refer her to [name] for therapy and counseling. 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 But I like to go ahead and get her started. We can also increase her Zoloft to 100 mg I'll see her back in 4-6 weeks.  Skin tags - we can remove these at next office visit.  Atypical skin lesions-the one on her nose looks very much like squamous cell skin cancer center did encourage her to get in with her dermatologist to have this removed. And the one on her left anterior forearm looks a little bit more pink and shiny consistent with a possible basal cell.  Right sided neck swelling. Don't feel any palpable masses. We'll go ahead and schedule for ultrasound.

## 2016-10-19 ENCOUNTER — Ambulatory Visit (INDEPENDENT_AMBULATORY_CARE_PROVIDER_SITE_OTHER): Payer: BC Managed Care – PPO

## 2016-10-19 DIAGNOSIS — R221 Localized swelling, mass and lump, neck: Secondary | ICD-10-CM

## 2016-11-09 ENCOUNTER — Ambulatory Visit (INDEPENDENT_AMBULATORY_CARE_PROVIDER_SITE_OTHER): Payer: BC Managed Care – PPO | Admitting: Family Medicine

## 2016-11-09 ENCOUNTER — Encounter: Payer: Self-pay | Admitting: Family Medicine

## 2016-11-09 VITALS — BP 118/81 | HR 89 | Wt 288.0 lb

## 2016-11-09 DIAGNOSIS — R221 Localized swelling, mass and lump, neck: Secondary | ICD-10-CM | POA: Diagnosis not present

## 2016-11-09 DIAGNOSIS — F3341 Major depressive disorder, recurrent, in partial remission: Secondary | ICD-10-CM

## 2016-11-09 NOTE — Progress Notes (Signed)
   Subjective:    Patient ID: Kaitlyn Kirby, female    DOB: 06-05-1970, 46 y.o.   MRN: [id number]  HPI 46 year old female comes in today to follow-up on her depression. When I last saw her about 6 weeks ago we decided to increase her Zoloft and had strong encouraged her to get in with a therapist/counselor. We've referred her to [name] and referral was placed.She does report still having decreased pleasure in doing things more than half the day and feeling tired nearly every day as well as trouble concentrating. No thoughts of wanting to harm herself.  She says she really hasn't noticed a big difference since increasing her dose about 4 weeks ago. 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  Follow-up neck mass-she did have a prominent area of fat just above the right supra clavicular region potentially a lipoma or possibly a liposarcoma sarcoma so they recommended MRI for further imaging. She says she has not heard back about scheduling the MRI. She has not had any pain but did want to go over the results today.  She did want to let me know that the skin lesion on her left forearm actually just disappeared on its own.  Review of Systems  He is a volleyball could check [employer] high school.    Objective:   Physical Exam  Constitutional: She is oriented to person, place, and time. She appears well-developed and well-nourished.  HENT:  Head: Normocephalic and atraumatic.  Cardiovascular: Normal rate, regular rhythm and normal heart sounds.   Pulmonary/Chest: Effort normal and breath sounds normal.  Neurological: She is alert and oriented to person, place, and time.  Skin: Skin is warm and dry.  Psychiatric: She has a normal mood and affect. Her behavior is normal.       Assessment & Plan:  Depression- PHQ -9  score of 14.  Uncontrolled.  She wants to continue with her current regimen for now. I encouraged her to give it another month at that point she doesn't feel like there is a major improvement or difference in her to go back down on her dose. Did encourage her to try to do the counseling as much as she is able to find her schedule is tight right now.   Neck mass-we'll check on scheduling for MRI. Unfortunately it was ordered as a hospital order versus outpatient so we'll get this corrected so that she can get scheduled in the next few weeks. Discussed Lipoma versus liposarcoma.  Answered questions. She would like removal even if it turns out to just be a lipoma.  Reminded due for tetanus vaccine, she says she will get it next time.

## 2016-11-10 ENCOUNTER — Other Ambulatory Visit: Payer: Self-pay | Admitting: Family Medicine

## 2016-11-10 DIAGNOSIS — R221 Localized swelling, mass and lump, neck: Secondary | ICD-10-CM

## 2016-11-10 NOTE — Progress Notes (Signed)
Order completed

## 2016-11-11 ENCOUNTER — Ambulatory Visit (INDEPENDENT_AMBULATORY_CARE_PROVIDER_SITE_OTHER): Payer: BC Managed Care – PPO | Admitting: Obstetrics & Gynecology

## 2016-11-11 ENCOUNTER — Encounter: Payer: Self-pay | Admitting: Obstetrics & Gynecology

## 2016-11-11 VITALS — BP 122/88 | HR 92 | Resp 16 | Ht 66.0 in | Wt 288.0 lb

## 2016-11-11 DIAGNOSIS — Z124 Encounter for screening for malignant neoplasm of cervix: Secondary | ICD-10-CM

## 2016-11-11 DIAGNOSIS — N941 Unspecified dyspareunia: Secondary | ICD-10-CM | POA: Diagnosis not present

## 2016-11-11 DIAGNOSIS — Z1151 Encounter for screening for human papillomavirus (HPV): Secondary | ICD-10-CM | POA: Diagnosis not present

## 2016-11-11 DIAGNOSIS — Z01419 Encounter for gynecological examination (general) (routine) without abnormal findings: Secondary | ICD-10-CM | POA: Diagnosis not present

## 2016-11-11 MED ORDER — LIDOCAINE 5 % EX OINT: 1.0000 "application " | TOPICAL_OINTMENT | CUTANEOUS | 1 refills | Status: DC | PRN

## 2016-11-12 ENCOUNTER — Encounter: Payer: Self-pay | Admitting: Obstetrics & Gynecology

## 2016-11-12 ENCOUNTER — Telehealth: Payer: Self-pay | Admitting: *Deleted

## 2016-11-12 LAB — CERVICOVAGINAL ANCILLARY ONLY
Bacterial vaginitis: NEGATIVE
Candida vaginitis: NEGATIVE

## 2016-11-12 NOTE — Telephone Encounter (Signed)
-----   Message from Guss Bunde, MD sent at 11/12/2016  4:18 PM EDT ----- No evidence of yeast or BV.  RN to call.

## 2016-11-12 NOTE — Telephone Encounter (Signed)
LM on voicemail of normal BV and yeast.

## 2016-11-12 NOTE — Progress Notes (Signed)
Subjective:     Kaitlyn Kirby is a 46 y.o. female here for a routine exam.  Current complaints: mild burning for 24 hours after intercourse.  Pt states it is still much better than it was before the [employer].   Gynecologic History Patient's last menstrual period was 10/26/2016. Contraception: condoms Last Pap: 2015. Results were: normal Last mammogram: 2018. Results were: normal  Obstetric History OB History  Gravida Para Term Preterm AB Living  0            SAB TAB Ectopic Multiple Live Births                    The following portions of the patient's history were reviewed and updated as appropriate: allergies, current medications, past family history, past medical history, past social history, past surgical history and problem list.  Review of Systems Pertinent items noted in HPI and remainder of comprehensive ROS otherwise negative.    Objective:      Vitals:   11/11/16 1039  BP: 122/88  Pulse: 92  Resp: 16  Weight: 288 lb (130.6 kg)  Height: 5\' 6"  (1.676 m)   Vitals:  WNL General appearance: alert, cooperative and no distress  HEENT: Normocephalic, without obvious abnormality, atraumatic Eyes: negative Throat: lips, mucosa, and tongue normal; teeth and gums normal  Respiratory: Clear to auscultation bilaterally  CV: Regular rate and rhythm  Breasts:  Normal appearance, no masses or tenderness, no nipple retraction or dimpling  GI: Soft, non-tender; bowel sounds normal; no masses,  no organomegaly  GU: External Genitalia:  Tanner V, no lesion Urethra:  No prolapse   Vagina: Pink, normal rugae, no blood or discharge; pink areas remain at introitus (from [facility]); not expanded since last visit. Non tender   Cervix: No CMT, no lesion  Uterus:  Normal size and contour, non tender, limited by habitus  Adnexa: Normal, no masses, non tender, limited by habitus  Musculoskeletal: No edema, redness or tenderness in the calves or thighs  Skin: No lesions or  rash  Lymphatic: Axillary adenopathy: none     Psychiatric: Normal mood and behavior        Assessment:    Healthy female exam.   dyspareunia    Plan:    pap with co testing Mammogram yearly Colonoscopy (new rec are age 31) Lidocaine for dysapurenia

## 2016-11-13 LAB — CYTOLOGY - PAP
DIAGNOSIS: NEGATIVE
HPV (WINDOPATH): NOT DETECTED

## 2016-11-14 ENCOUNTER — Ambulatory Visit (HOSPITAL_BASED_OUTPATIENT_CLINIC_OR_DEPARTMENT_OTHER)
Admission: RE | Admit: 2016-11-14 | Discharge: 2016-11-14 | Disposition: A | Discharge status: Home / Self Care | Payer: BC Managed Care – PPO | Source: Ambulatory Visit | Attending: Family Medicine | Admitting: Family Medicine

## 2016-11-14 DIAGNOSIS — R221 Localized swelling, mass and lump, neck: Secondary | ICD-10-CM | POA: Diagnosis present

## 2016-11-14 MED ORDER — GADOBENATE DIMEGLUMINE 529 MG/ML IV SOLN
20.0000 mL | Freq: Once | INTRAVENOUS | Status: AC | PRN
  Administered 2016-11-14: 20 mL via INTRAVENOUS

## 2017-03-08 ENCOUNTER — Ambulatory Visit: Payer: BC Managed Care – PPO | Admitting: Family Medicine

## 2017-03-29 ENCOUNTER — Ambulatory Visit: Payer: BC Managed Care – PPO | Admitting: Family Medicine

## 2017-03-29 ENCOUNTER — Encounter: Payer: Self-pay | Admitting: Family Medicine

## 2017-03-29 VITALS — BP 125/77 | HR 110 | Ht 66.0 in | Wt 289.0 lb

## 2017-03-29 DIAGNOSIS — F3341 Major depressive disorder, recurrent, in partial remission: Secondary | ICD-10-CM

## 2017-03-29 DIAGNOSIS — D224 Melanocytic nevi of scalp and neck: Secondary | ICD-10-CM | POA: Diagnosis not present

## 2017-03-29 DIAGNOSIS — L918 Other hypertrophic disorders of the skin: Secondary | ICD-10-CM | POA: Diagnosis not present

## 2017-03-29 DIAGNOSIS — Z23 Encounter for immunization: Secondary | ICD-10-CM | POA: Diagnosis not present

## 2017-03-29 DIAGNOSIS — G4489 Other headache syndrome: Secondary | ICD-10-CM | POA: Diagnosis not present

## 2017-03-29 DIAGNOSIS — L989 Disorder of the skin and subcutaneous tissue, unspecified: Secondary | ICD-10-CM | POA: Diagnosis not present

## 2017-03-29 DIAGNOSIS — L821 Other seborrheic keratosis: Secondary | ICD-10-CM

## 2017-03-29 MED ORDER — SERTRALINE HCL 25 MG PO TABS: 25.0000 mg | ORAL_TABLET | Freq: Every day | ORAL | 0 refills | Status: DC

## 2017-03-29 MED ORDER — VENLAFAXINE HCL ER 37.5 MG PO CP24: 37.5000 mg | ORAL_CAPSULE | Freq: Every day | ORAL | 0 refills | Status: DC

## 2017-03-29 NOTE — Patient Instructions (Addendum)
Cut the sertraline in half.  Take half tab daily for 7 days.  Then take the 25 mg tab for 7 days.  I did send over prescription for this.  Went to complete the medications and okay to start the new medication the very next day.  Apply Vaseline 2-3 times a day to the lesions.  You do not have to cover after tomorrow.  But you can if it is rubbing your close or if the wound could potentially get dirty.  Call if any concern about wound healing.

## 2017-03-29 NOTE — Progress Notes (Signed)
Subjective:    Patient ID: Kaitlyn Kirby, female    DOB: Feb 05, 1971, 46 y.o.   MRN: [id number]  HPI 49-month follow-up for depression-when I last saw her symptoms were still fairly uncontrolled.  She was able to make one therapy session and thought it was helpful but was not able to make it back because of her schedule.  Because she really likes how the sertraline helps lower her anxiety but says that she was just feels numb to things.  She had a couple family members passed away recently and says she really did not even feel tearful or sad which she did not like.  She has been working with Kaitlyn Kirby carrier, her therapist pretty regularly and says that has been really helpful.  She is also here today to have several skin tags removed.  She has one under the right axilla and one under the right breast as well as one on the left anterior neck.  She has a wartlike lesion on the left anterior shoulder and a larger mole on the left side of the neck as well.  She has 2 seborrheic keratoses on her left inner thigh and one on the right side of her neck.  She also complains of persistent pain over the right eye over the eyebrow ridge for about 9 months..  She says sometimes pressing on the inner part of her I will actually give her some relief.  It has been going on for about 9 months and she had surgery on that eye about 6 months ago.  No sinus congestion pain or pressure.  no vision loss.  NO worsening or alleviating factors  Review of Systems  BP 125/77   Pulse (!) 110   Ht 5\' 6"  (1.676 m)   Wt 289 lb (131.1 kg)   SpO2 97%   BMI 46.65 kg/m     Allergies  Allergen Reactions  . Amoxicillin Hives  . Celexa [Citalopram Hydrobromide] Other (See Comments)  . Penicillins Hives  . Fluoxetine Palpitations    Past Medical History:  Diagnosis Date  . Abnormal Pap smear 1995   Colpo  . Allergy   . Asthma    bronchial spasms  . Basal cell carcinoma 2010  . Hemorrhoids   . IBS (irritable bowel  syndrome) 8-10 years ago  . Rectal fissure     Past Surgical History:  Procedure Laterality Date  . EYE SURGERY    . SKIN CANCER EXCISION  2010   chest, basal cell  . WISDOM TOOTH EXTRACTION  46 years old    Social History   Socioeconomic History  . Marital status: Single    Spouse name: Not on file  . Number of children: Not on file  . Years of education: Not on file  . Highest education level: Not on file  Social Needs  . Financial resource strain: Not on file  . Food insecurity - worry: Not on file  . Food insecurity - inability: Not on file  . Transportation needs - medical: Not on file  . Transportation needs - non-medical: Not on file  Occupational History  . Not on file  Tobacco Use  . Smoking status: Former Smoker    Packs/day: 0.50    Last attempt to quit: 11/10/1995    Years since quitting: 21.3  . Smokeless tobacco: Never Used  Substance and Sexual Activity  . Alcohol use: Yes    Comment: occasional  . Drug use: No  . Sexual activity: Yes  Partners: Male    Birth control/protection: Condom  Other Topics Concern  . Not on file  Social History Narrative  . Not on file    Family History  Problem Relation Age of Onset  . Hypertension Mother   . Thyroid disease Mother   . Hypertension Father   . Heart attack Father   . Diabetes Brother     Outpatient Encounter Medications as of 03/29/2017  Medication Sig  . Albuterol Sulfate (PROAIR RESPICLICK) 500 (90 Base) MCG/ACT AEPB Inhale 1 puff into the lungs every 4 (four) hours as needed (wheeze).  . cetirizine (ZYRTEC) 10 MG tablet Take 10 mg by mouth daily.  . fluticasone (FLONASE) 50 MCG/ACT nasal spray Place into both nostrils daily.  . sertraline (ZOLOFT) 25 MG tablet Take 1 tablet (25 mg total) by mouth daily.  . [DISCONTINUED] sertraline (ZOLOFT) 100 MG tablet TAKE 1 TABLET BY MOUTH EVERY DAY.  Marland Kitchen venlafaxine XR (EFFEXOR-XR) 37.5 MG 24 hr capsule Take 1 capsule (37.5 mg total) by mouth daily with  breakfast.  . [DISCONTINUED] AMBULATORY NON FORMULARY MEDICATION Place 1 capsule vaginally at bedtime. Boric Acid capsules 600 Mg vaginally @ Bedtime for 2 weeks (Patient not taking: Reported on 11/11/2016)  . [DISCONTINUED] lidocaine (XYLOCAINE) 5 % ointment Apply 1 application topically as needed.   No facility-administered encounter medications on file as of 03/29/2017.          Objective:   Physical Exam  Constitutional: She is oriented to person, place, and time. She appears well-developed and well-nourished.  HENT:  Head: Normocephalic and atraumatic.  Eyes: Conjunctivae and EOM are normal.  Cardiovascular: Normal rate.  Pulmonary/Chest: Effort normal.  Neurological: She is alert and oriented to person, place, and time.  Skin: Skin is dry. No pallor.  She has one under the right axilla and one under the right breast as well as one on the left anterior neck.  She has a wartlike lesion on the left anterior shoulder and a larger mole on the left side of the neck as well.  She has 2 seborrheic keratoses on her left inner thigh and one on the right side of her neck.  Psychiatric: She has a normal mood and affect. Her behavior is normal.  Vitals reviewed.       Assessment & Plan:  Major depressive disorder, recurrent-discussed options.  Will wean sertraline and switch her to Effexor.  Call if any problems.  Follow-up in about 5-6 weeks.  Skin tags-removal performed.  Please see note below.  Seborrheic keratoses-cryotherapy performed.  Patient tolerated well.  Wart like lesion-shave biopsy performed just to rule out squamous cell.  Atypical nevus on the left side of the neck-shave biopsy performed.  Will send for further analysis.  Right eye pain/ocular headache-did encourage her to get back in with her eye doctor.  Just to make sure that everything is okay since she did have eye surgery about 6 months ago.  Also will get a sinus CT.  Skin Tag Removal Procedure  Note  Pre-operative Diagnosis: Classic skin tags (acrochordon)  Post-operative Diagnosis: Classic skin tags (acrochordon)  Locations: 1 under right axilla and one under her right breast.  1 on Left anterior neck.   Indications: irritation  Anesthesia: not required    Procedure Details  The risks (including bleeding and infection) and benefits of the procedure and Verbal informed consent obtained. Using sterile iris scissors, multiple skin tags were snipped off at their bases after cleansing with alcohol.  Bleeding was controlled  by pressure.   Findings: Pathognomonic benign lesions  not sent for pathological exam.  Condition: Stable  Complications: none.  Plan: 1. Instructed to keep the wounds dry and covered for 24-48h and clean thereafter. 2. Warning signs of infection were reviewed.   3. Recommended that the patient use OTC acetaminophen as needed for pain.  4. Return as needed.  Cryotherapy Procedure Note  Pre-operative Diagnosis: Seborrheic keratosis  Post-operative Diagnosis: same  Locations: left inner thigh and right neck  Indications: irritation   Anesthesia: not required    Procedure Details  Patient informed of risks (permanent scarring, infection, light or dark discoloration, bleeding, infection, weakness, numbness and recurrence of the lesion) and benefits of the procedure and verbal informed consent obtained.  The areas are treated with liquid nitrogen therapy, frozen until ice ball extended 1-2 mm beyond lesion, allowed to thaw, and treated again. The patient tolerated procedure well.  The patient was instructed on post-op care, warned that there may be blister formation, redness and pain. Recommend OTC analgesia as needed for pain.  Condition: Stable  Complications: none.  Plan: 1. Instructed to keep the area dry and covered for 24-48h and clean thereafter. 2. Warning signs of infection were reviewed.   3. Recommended that the patient use OTC  acetaminophen as needed for pain.  4. Return PRN.   Shave Biopsy Procedure Note  Pre-operative Diagnosis: Suspicious lesion  Post-operative Diagnosis: same  Locations: A rough textured warty-like papular lesion approximately half a centimeter on the left anterior shoulder and on the left side of the neck a more flat pigmented brown papular lesion.  Indications: irritation   Anesthesia: Lidocaine 1% with epinephrine without added sodium bicarbonate  Procedure Details  Patient informed of the risks (including bleeding and infection) and benefits of the procedure and Verbal informed consent obtained.  The lesion and surrounding area were given a sterile prep using chlorhexidine and draped in the usual sterile fashion. A double blade was used to shave an area of skin approximately 27mm by 6cmm on the left anterior shoulder and a 5 mm x 31mm lesion shaved from the left side of neck. .  Hemostasis achieved with alumuninum chloride. Antibiotic ointment and a sterile dressing applied.  The specimens were sent for pathologic examination. The patient tolerated the procedure well.  EBL: tracel  Findings: Await pathology  Condition: Stable  Complications: none.  Plan: 1. Instructed to keep the wound dry and covered for 24-48h and clean thereafter. 2. Warning signs of infection were reviewed.   3. Recommended that the patient use OTC acetaminophen as needed for pain.  4. Return PRN.

## 2017-05-10 ENCOUNTER — Other Ambulatory Visit: Payer: Self-pay | Admitting: *Deleted

## 2017-05-10 DIAGNOSIS — F3341 Major depressive disorder, recurrent, in partial remission: Secondary | ICD-10-CM

## 2017-05-10 MED ORDER — VENLAFAXINE HCL ER 37.5 MG PO CP24: 37.5000 mg | ORAL_CAPSULE | Freq: Every day | ORAL | 0 refills | Status: DC

## 2017-05-18 ENCOUNTER — Other Ambulatory Visit: Payer: Self-pay | Admitting: Family Medicine

## 2017-05-18 DIAGNOSIS — R05 Cough: Secondary | ICD-10-CM

## 2017-05-18 DIAGNOSIS — R059 Cough, unspecified: Secondary | ICD-10-CM

## 2017-05-19 ENCOUNTER — Ambulatory Visit: Payer: BC Managed Care – PPO | Admitting: Physician Assistant

## 2017-05-31 ENCOUNTER — Ambulatory Visit: Payer: BC Managed Care – PPO | Admitting: Family Medicine

## 2017-05-31 ENCOUNTER — Encounter: Payer: Self-pay | Admitting: Family Medicine

## 2017-05-31 VITALS — BP 126/77 | HR 106 | Ht 66.0 in | Wt 294.0 lb

## 2017-05-31 DIAGNOSIS — F3341 Major depressive disorder, recurrent, in partial remission: Secondary | ICD-10-CM | POA: Diagnosis not present

## 2017-05-31 DIAGNOSIS — R05 Cough: Secondary | ICD-10-CM

## 2017-05-31 DIAGNOSIS — M25551 Pain in right hip: Secondary | ICD-10-CM | POA: Diagnosis not present

## 2017-05-31 DIAGNOSIS — R059 Cough, unspecified: Secondary | ICD-10-CM

## 2017-05-31 MED ORDER — VENLAFAXINE HCL ER 75 MG PO CP24: 75.0000 mg | ORAL_CAPSULE | Freq: Every day | ORAL | 1 refills | Status: DC

## 2017-05-31 MED ORDER — ALBUTEROL SULFATE 108 (90 BASE) MCG/ACT IN AEPB: 1.0000 | INHALATION_SPRAY | RESPIRATORY_TRACT | 1 refills | Status: DC | PRN

## 2017-05-31 NOTE — Progress Notes (Signed)
Subjective:    Patient ID: Kaitlyn Kirby, female    DOB: 11-29-1970, 47 y.o.   MRN: [id number]  HPI MDD -she is here today to follow-up for mood.  She says that the Effexor is working much better than the Zoloft.  In fact she was coming down off the Zoloft she noticed that her persistent headaches that she was experiencing had actually gone away.  She really feels like that was the cause.  She is now on 37.5 mg of Effexor daily.  Still struggling a little bit with feeling down several days a week and decreased pleasure in doing things.  Also requesting a refill on her albuterol inhaler for the spring.  She says to have allergies and allergic rhinitis but also tends to get some wheezing and shortness of breath and cough.  Is also complaining of right hip pain today.  She has had a history of pain and tightness in that right iliotibial band.  In fact she did physical therapy and had dry needling and it helped her significantly.  She said she pretended to reach out and kick someone, joking around and ever since then she has had pain.  She felt an immediate pop in that hip when she did this.  She is actually been to her chiropractor and started doing some home exercises again but if she is not improving she wants to be able to return to PT and possibly have a dry needling done as well.   Review of Systems  BP 126/77   Pulse (!) 106   Ht 5\' 6"  (1.676 m)   Wt 294 lb (133.4 kg)   SpO2 98%   BMI 47.45 kg/m     Allergies  Allergen Reactions  . Amoxicillin Hives  . Celexa [Citalopram Hydrobromide] Other (See Comments)  . Penicillins Hives  . Fluoxetine Palpitations    Past Medical History:  Diagnosis Date  . Abnormal Pap smear 1995   Colpo  . Allergy   . Asthma    bronchial spasms  . Basal cell carcinoma 2010  . Hemorrhoids   . IBS (irritable bowel syndrome) 8-10 years ago  . Rectal fissure     Past Surgical History:  Procedure Laterality Date  . EYE SURGERY    . SKIN CANCER  EXCISION  2010   chest, basal cell  . WISDOM TOOTH EXTRACTION  47 years old    Social History   Socioeconomic History  . Marital status: Single    Spouse name: Not on file  . Number of children: Not on file  . Years of education: Not on file  . Highest education level: Not on file  Social Needs  . Financial resource strain: Not on file  . Food insecurity - worry: Not on file  . Food insecurity - inability: Not on file  . Transportation needs - medical: Not on file  . Transportation needs - non-medical: Not on file  Occupational History  . Not on file  Tobacco Use  . Smoking status: Former Smoker    Packs/day: 0.50    Last attempt to quit: 11/10/1995    Years since quitting: 21.5  . Smokeless tobacco: Never Used  Substance and Sexual Activity  . Alcohol use: Yes    Comment: occasional  . Drug use: No  . Sexual activity: Yes    Partners: Male    Birth control/protection: Condom  Other Topics Concern  . Not on file  Social History Narrative  . Not on  file    Family History  Problem Relation Age of Onset  . Hypertension Mother   . Thyroid disease Mother   . Hypertension Father   . Heart attack Father   . Diabetes Brother     Outpatient Encounter Medications as of 05/31/2017  Medication Sig  . Albuterol Sulfate (PROAIR RESPICLICK) 333 (90 Base) MCG/ACT AEPB Inhale 1 puff into the lungs every 4 (four) hours as needed (wheeze).  . cetirizine (ZYRTEC) 10 MG tablet Take 10 mg by mouth daily.  . fluticasone (FLONASE) 50 MCG/ACT nasal spray Place into both nostrils daily.  Marland Kitchen venlafaxine XR (EFFEXOR-XR) 75 MG 24 hr capsule Take 1 capsule (75 mg total) by mouth daily with breakfast. Please call office and schedule a follow up appointment for additional refills.  . [DISCONTINUED] Albuterol Sulfate (PROAIR RESPICLICK) 545 (90 Base) MCG/ACT AEPB Inhale 1 puff into the lungs every 4 (four) hours as needed (wheeze).  . [DISCONTINUED] venlafaxine XR (EFFEXOR-XR) 37.5 MG 24 hr  capsule Take 1 capsule (37.5 mg total) by mouth daily with breakfast. Please call office and schedule a follow up appointment for additional refills.  . [DISCONTINUED] sertraline (ZOLOFT) 100 MG tablet TAKE 1 TABLET BY MOUTH EVERY DAY  . [DISCONTINUED] sertraline (ZOLOFT) 25 MG tablet Take 1 tablet (25 mg total) by mouth daily.   No facility-administered encounter medications on file as of 05/31/2017.          Objective:   Physical Exam  Constitutional: She is oriented to person, place, and time. She appears well-developed and well-nourished.  HENT:  Head: Normocephalic and atraumatic.  Cardiovascular: Normal rate, regular rhythm and normal heart sounds.  Pulmonary/Chest: Effort normal and breath sounds normal.  Musculoskeletal:  Nontender over the great greater trochanter.  Hip, knee, ankle strength is 5 out of 5 bilaterally.  Neurological: She is alert and oriented to person, place, and time.  Skin: Skin is warm and dry.  Psychiatric: She has a normal mood and affect. Her behavior is normal.       Assessment & Plan:  MDD - discussed options.  Will up Effexor to 75 mg daily.  New per scription sent to pharmacy.  Follow-up in 3 months or sooner if she is having any problems.  She can also some you my chart note if she would like.  Right hip pain with tightness of the iliotibial band-encouraged her to continue with her home stretches and exercises and possibly add ice and that she feels like it has been more swollen as well.  If she is not improving over the next few weeks please let me know and I will be happy to place referral back to physical therapy for dry needling.  Allergies-we will refill her albuterol.

## 2017-06-02 ENCOUNTER — Encounter: Payer: Self-pay | Admitting: Family Medicine

## 2017-08-05 ENCOUNTER — Ambulatory Visit: Payer: BC Managed Care – PPO | Admitting: Family Medicine

## 2017-08-05 ENCOUNTER — Other Ambulatory Visit: Payer: Self-pay

## 2017-08-05 ENCOUNTER — Encounter: Payer: Self-pay | Admitting: Family Medicine

## 2017-08-05 DIAGNOSIS — K644 Residual hemorrhoidal skin tags: Secondary | ICD-10-CM

## 2017-08-05 DIAGNOSIS — R252 Cramp and spasm: Secondary | ICD-10-CM | POA: Diagnosis not present

## 2017-08-05 DIAGNOSIS — Z13220 Encounter for screening for lipoid disorders: Secondary | ICD-10-CM

## 2017-08-05 MED ORDER — DILTIAZEM GEL 2 %: 1.0000 "application " | Freq: Four times a day (QID) | CUTANEOUS | 1 refills | Status: DC | PRN

## 2017-08-05 MED ORDER — PHENTERMINE-TOPIRAMATE ER 3.75-23 MG PO CP24: 1.0000 | ORAL_CAPSULE | Freq: Every day | ORAL | 0 refills | Status: DC

## 2017-08-05 NOTE — Progress Notes (Signed)
Subjective:    CC:   HPI:  47 year old female is here to discuss strategies for weight loss.  She was previously on Qsymia and did well with it is interested in restarting it again.  She was 293 pounds today and really wants to get back on track.  She was down to 237 pounds in March 2016.  Is not currently exercising but says she is Artie reached out to the personal trainer that she used before and has scheduled an appointment and will be starting to exercise next week. She really struggles with sweat tea. She drink about 4 a day.  Wants to cut out sugary foods.  He does not eat breakfast regularly.  She would also like a refill on cream for her external hemorrhoids. She says it is the diazepam.    She does complain of some lower abdominal muscle cramping that can start her back and then radiate forward.  It can be on the right or the left side.  She is not sure exactly what triggers it.  She has not had blood work done in quite some time.  She denies any bloating or changes in her bowels.  Past medical history, Surgical history, Family history not pertinant except as noted below, Social history, Allergies, and medications have been entered into the medical record, reviewed, and corrections made.   Review of Systems: No fevers, chills, night sweats, weight loss, chest pain, or shortness of breath.   Objective:    General: Well Developed, well nourished, and in no acute distress.  Neuro: Alert and oriented x3, extra-ocular muscles intact, sensation grossly intact.  HEENT: Normocephalic, atraumatic  Skin: Warm and dry, no rashes. Cardiac: Regular rate and rhythm, no murmurs rubs or gallops, no lower extremity edema.  Respiratory: Clear to auscultation bilaterally. Not using accessory muscles, speaking in full sentences.   Impression and Recommendations:   Weight gain/BMI -discussed strategies around this.  We will go ahead and restart Qsymia will need to taper her dose as tolerated.  If she  is doing well we can make an adjustment when I see her back.  Start working with her Physiological scientist and really work on cutting back on the sweet tea as below.  Current weight: 293 lbs Previous weight: 294 lbs  Change in weight: down 1 lb Goal Weight: 200 lbs Exercise goals: We will start work with her personal trainer next week.  And really like to have her work out 2 days a week to start. Dietary goals: Sure eating some type of protein for breakfast.  It can be a Premier protein shake or even something like a hard boiled egg.  Cut back on sweet tea from 4 drinks a day to 1 drink per day. Medication: New start Qsymia  Follow-up: 4 weeks.    External hemorrhoids -  Diazepam rectal gel refilled today.   Abdominal muscle wall cramping-unclear etiology at this point it does seem to come and go.  She denies any bloating etc.  We will check for electrolyte abnormalities on her blood work.

## 2017-08-05 NOTE — Telephone Encounter (Signed)
Qsymia was sent to [facility] instead of [facility] in [place].

## 2017-08-06 MED ORDER — PHENTERMINE-TOPIRAMATE ER 3.75-23 MG PO CP24: 1.0000 | ORAL_CAPSULE | Freq: Every day | ORAL | 0 refills | Status: DC

## 2017-08-12 LAB — COMPLETE METABOLIC PANEL WITH GFR
AG RATIO: 1.5 (calc) (ref 1.0–2.5)
ALKALINE PHOSPHATASE (APISO): 58 U/L (ref 33–115)
ALT: 21 U/L (ref 6–29)
AST: 18 U/L (ref 10–35)
Albumin: 4 g/dL (ref 3.6–5.1)
BILIRUBIN TOTAL: 0.6 mg/dL (ref 0.2–1.2)
BUN: 12 mg/dL (ref 7–25)
CHLORIDE: 102 mmol/L (ref 98–110)
CO2: 28 mmol/L (ref 20–32)
Calcium: 9.3 mg/dL (ref 8.6–10.2)
Creat: 0.85 mg/dL (ref 0.50–1.10)
GFR, Est African American: 95 mL/min/{1.73_m2} (ref >=60)
GFR, Est Non African American: 82 mL/min/{1.73_m2} (ref >=60)
Globulin: 2.6 g/dL (calc) (ref 1.9–3.7)
Glucose, Bld: 127 mg/dL — ABNORMAL HIGH (ref 65–99)
POTASSIUM: 4.6 mmol/L (ref 3.5–5.3)
Sodium: 137 mmol/L (ref 135–146)
Total Protein: 6.6 g/dL (ref 6.1–8.1)

## 2017-08-12 LAB — LIPID PANEL
Cholesterol: 195 mg/dL (ref <200)
HDL: 38 mg/dL — AB (ref >50)
LDL Cholesterol (Calc): 127 mg/dL (calc) — ABNORMAL HIGH
Non-HDL Cholesterol (Calc): 157 mg/dL (calc) — ABNORMAL HIGH (ref <130)
Total CHOL/HDL Ratio: 5.1 (calc) — ABNORMAL HIGH (ref <5.0)
Triglycerides: 178 mg/dL — ABNORMAL HIGH (ref <150)

## 2017-08-12 LAB — CBC
HEMATOCRIT: 39.5 % (ref 35.0–45.0)
HEMOGLOBIN: 13.6 g/dL (ref 11.7–15.5)
MCH: 31.3 pg (ref 27.0–33.0)
MCHC: 34.4 g/dL (ref 32.0–36.0)
MCV: 90.8 fL (ref 80.0–100.0)
MPV: 10.9 fL (ref 7.5–12.5)
Platelets: 249 10*3/uL (ref 140–400)
RBC: 4.35 10*6/uL (ref 3.80–5.10)
RDW: 12.8 % (ref 11.0–15.0)
WBC: 7.6 10*3/uL (ref 3.8–10.8)

## 2017-08-12 LAB — TSH: TSH: 2.12 mIU/L

## 2017-08-16 ENCOUNTER — Other Ambulatory Visit: Payer: Self-pay | Admitting: *Deleted

## 2017-08-16 DIAGNOSIS — R7309 Other abnormal glucose: Secondary | ICD-10-CM

## 2017-08-26 ENCOUNTER — Other Ambulatory Visit: Payer: Self-pay

## 2017-08-26 DIAGNOSIS — R252 Cramp and spasm: Secondary | ICD-10-CM

## 2017-08-26 MED ORDER — DILTIAZEM GEL 2 %: 1.0000 "application " | Freq: Four times a day (QID) | CUTANEOUS | 1 refills | Status: DC | PRN

## 2017-09-06 ENCOUNTER — Ambulatory Visit: Payer: BC Managed Care – PPO | Admitting: Family Medicine

## 2017-09-06 ENCOUNTER — Encounter: Payer: Self-pay | Admitting: Family Medicine

## 2017-09-06 DIAGNOSIS — K644 Residual hemorrhoidal skin tags: Secondary | ICD-10-CM | POA: Diagnosis not present

## 2017-09-06 MED ORDER — PHENTERMINE-TOPIRAMATE ER 7.5-46 MG PO CP24: 1.0000 | ORAL_CAPSULE | Freq: Every day | ORAL | 0 refills | Status: DC

## 2017-09-06 NOTE — Progress Notes (Signed)
   Subjective:    Patient ID: Kaitlyn Kirby, female    DOB: 04-Jun-1970, 47 y.o.   MRN: [id number]  HPI 47 year old female is here today to follow-up for severe obesity/abnormal weight gain.  She is here for 4-week follow-up for new start with Qsymia and setting some strategies and goals around weight loss. She is trying ot do better with breakfast. She has really cut back her sweet tea.  Drinking less than daily, occ with eating out.  Hasn't start the exercise program yet.    Hemorrhoids-she says that they are flaring more frequently when they do it is more severe.  She said she is really considering surgery.  She had a consultation done last year where they discussed doing a hemorrhoidectomy but felt like the timing was a good.  She says she will probably shoot for next summer.  Review of Systems     Objective:   Physical Exam  Constitutional: She is oriented to person, place, and time. She appears well-developed and well-nourished.  HENT:  Head: Normocephalic and atraumatic.  Cardiovascular: Normal rate, regular rhythm and normal heart sounds.  Pulmonary/Chest: Effort normal and breath sounds normal.  Neurological: She is alert and oriented to person, place, and time.  Skin: Skin is warm and dry.  Psychiatric: She has a normal mood and affect. Her behavior is normal.        Assessment & Plan:  Weight gain/BMI - doing well. Down 4 lbs. No major change in goals as she is still working on this.  Need to incorporate exercise.  She can follow-up in 4 weeks with a nurse visit and will follow back up with me in 8 weeks.   Current weight: 289   lbs Previous weight: 293 lbs  Change in weight: down 4 lb Goal Weight: 200 lbs Exercise goals: We will start work with her personal trainer next week.  And really like to have her work out 2 days a week to start. Dietary goals: Sure eating some type of protein for breakfast.  It can be a Premier protein shake or even something like a hard boiled  egg.  Cut back on sweet tea from 4 drinks a day to 1 drink per day. Medication: Increase Qsymia  Follow-up: 4 weeks.   External hemorrhoids-did refill the diltiazem gel when she was last here.  Plan for surgery next summer.  Will need to probably make a new referral early next year.

## 2017-09-06 NOTE — Patient Instructions (Signed)
Current weight: 289   lbs Previous weight: 293 lbs  Change in weight: down 4 lb Goal Weight: 200 lbs Exercise goals: We will start work with her personal trainer next week.  And really like to have her work out 2 days a week to start. Dietary goals: Sure eating some type of protein for breakfast.  It can be a Premier protein shake or even something like a hard boiled egg.  Cut back on sweet tea from 4 drinks a day to 1 drink per day. Medication: Increase Qsymia  Follow-up: 4 weeks.

## 2017-11-11 ENCOUNTER — Encounter: Payer: Self-pay | Admitting: Family Medicine

## 2017-11-11 ENCOUNTER — Ambulatory Visit: Payer: BC Managed Care – PPO | Admitting: Family Medicine

## 2017-11-11 VITALS — BP 133/86 | HR 85 | Temp 97.8°F | Ht 66.0 in | Wt 289.0 lb

## 2017-11-11 DIAGNOSIS — R220 Localized swelling, mass and lump, head: Secondary | ICD-10-CM

## 2017-11-11 DIAGNOSIS — K118 Other diseases of salivary glands: Secondary | ICD-10-CM

## 2017-11-11 NOTE — Patient Instructions (Signed)
Thank you for coming in today. Get labs now.  Continue ibuprofen or tylenol.  I will get results back ASAP.  Let me know if you worsen.  Return sooner if needed.   Lymphadenopathy Lymphadenopathy refers to swollen or enlarged lymph glands, also called lymph nodes. Lymph glands are part of your body's defense (immune) system, which protects the body from infections, germs, and diseases. Lymph glands are found in many locations in your body, including the neck, underarm, and groin. Many things can cause lymph glands to become enlarged. When your immune system responds to germs, such as viruses or bacteria, infection-fighting cells and fluid build up. This causes the glands to grow in size. Usually, this is not something to worry about. The swelling and any soreness often go away without treatment. However, swollen lymph glands can also be caused by a number of diseases. Your health care provider may do various tests to help determine the cause. If the cause of your swollen lymph glands cannot be found, it is important to monitor your condition to make sure the swelling goes away. Follow these instructions at home: Watch your condition for any changes. The following actions may help to lessen any discomfort you are feeling:  Get plenty of rest.  Take medicines only as directed by your health care provider. Your health care provider may recommend over-the-counter medicines for pain.  Apply moist heat compresses to the site of swollen lymph nodes as directed by your health care provider. This can help reduce any pain.  Check your lymph nodes daily for any changes.  Keep all follow-up visits as directed by your health care provider. This is important.  Contact a health care provider if:  Your lymph nodes are still swollen after 2 weeks.  Your swelling increases or spreads to other areas.  Your lymph nodes are hard, seem fixed to the skin, or are growing rapidly.  Your skin over the lymph  nodes is red and inflamed.  You have a fever.  You have chills.  You have fatigue.  You develop a sore throat.  You have abdominal pain.  You have weight loss.  You have night sweats. Get help right away if:  You notice fluid leaking from the area of the enlarged lymph node.  You have severe pain in any area of your body.  You have chest pain.  You have shortness of breath. This information is not intended to replace advice given to you by your health care provider. Make sure you discuss any questions you have with your health care provider. Document Released: 12/24/2007 Document Revised: 08/22/2015 Document Reviewed: 10/19/2013 Elsevier Interactive Patient Education  [employer].

## 2017-11-11 NOTE — Progress Notes (Signed)
Kaitlyn Kirby is a 47 y.o. female who presents to [facility]: [facility] today for left jaw swelling. Kaitlyn Kirby notes some swelling at the left angle of the jaw and neck.  She has this is been present for a few days.  It was initially tender but the tenderness is improving.  She denies any fevers cough congestion runny nose nausea vomiting or diarrhea.  She is tried over-the-counter medications which help a bit.  She feels well otherwise with no issues otherwise.  She is uncertain about her measles vaccination status but thinks she was vaccinated as a child.   ROS as above:  Exam:  BP 133/86   Pulse 85   Temp 97.8 F (36.6 C) (Oral)   Ht 5\' 6"  (1.676 m)   Wt 289 lb (131.1 kg)   BMI 46.65 kg/m  Gen: Well NAD HEENT: EOMI,  MMM swelling at the left angle of the jaw present.  No large cervical lymphadenopathy.  Tympanic membranes are normal-appearing bilaterally.  Normal oropharynx.  Sternocleidomastoids are nontender.  Normal neck motion.  Parotid gland is smooth slightly enlarged minimally tender without nodules. Lungs: Normal work of breathing. CTABL Heart: RRR no MRG Abd: NABS, Soft. Nondistended, Nontender Exts: Brisk capillary refill, warm and well perfused.   Lab and Radiology Results No results found for this or any previous visit (from the past 72 hour(s)). No results found.    Assessment and Plan: 47 y.o. female with left angle of the jaw swelling either lymphadenopathy or parotid gland swelling.  Etiology is unclear.  Suspect this is likely going to be self-limited viral etiology however the presentation is somewhat unusual for this.  Plan for limited work-up below including CBC with differential metabolic panel.  Will work up specifically parotid gland with lipase amylase and mumps antibody panel.  Recheck if not improving in the near future.  Treatment currently with  prescription strength NSAIDs and also over-the-counter Tylenol.  Influenza vaccine discussed.  Patient health today will defer for 1 month. Orders Placed This Encounter  Procedures  . CBC with Differential/Platelet  . COMPLETE METABOLIC PANEL WITH GFR  . Lipase  . Amylase  . Mumps antibody, IgG  . Mumps Antibody, IgM   No orders of the defined types were placed in this encounter.    Historical information moved to improve visibility of documentation.  Past Medical History:  Diagnosis Date  . Abnormal Pap smear 1995   Colpo  . Allergy   . Asthma    bronchial spasms  . Basal cell carcinoma 2010  . Hemorrhoids   . IBS (irritable bowel syndrome) 8-10 years ago  . Rectal fissure    Past Surgical History:  Procedure Laterality Date  . EYE SURGERY    . SKIN CANCER EXCISION  2010   chest, basal cell  . WISDOM TOOTH EXTRACTION  47 years old   Social History   Tobacco Use  . Smoking status: Former Smoker    Packs/day: 0.50    Last attempt to quit: 11/10/1995    Years since quitting: 22.0  . Smokeless tobacco: Never Used  Substance Use Topics  . Alcohol use: Yes    Comment: occasional   family history includes Diabetes in her brother; Heart attack in her father; Hypertension in her father and mother; Thyroid disease in her mother.  Medications: Current Outpatient Medications  Medication Sig Dispense Refill  . Albuterol Sulfate (PROAIR RESPICLICK) 102 (90 Base) MCG/ACT AEPB  Inhale 1 puff into the lungs every 4 (four) hours as needed (wheeze). 1 each 1  . cetirizine (ZYRTEC) 10 MG tablet Take 10 mg by mouth daily.    Marland Kitchen diltiazem 2 % GEL Apply 1 application topically 4 (four) times daily as needed. 30 g 1  . fluticasone (FLONASE) 50 MCG/ACT nasal spray Place into both nostrils daily.    . Phentermine-Topiramate (QSYMIA) 7.5-46 MG CP24 Take 1 tablet by mouth daily. 30 capsule 0  . venlafaxine XR (EFFEXOR-XR) 75 MG 24 hr capsule Take 1 capsule (75 mg total) by mouth daily  with breakfast. Please call office and schedule a follow up appointment for additional refills. 90 capsule 1   No current facility-administered medications for this visit.    Allergies  Allergen Reactions  . Amoxicillin Hives  . Celexa [Citalopram Hydrobromide] Other (See Comments)  . Penicillins Hives  . Fluoxetine Palpitations     Discussed warning signs or symptoms. Please see discharge instructions. Patient expresses understanding.

## 2017-11-13 LAB — CBC WITH DIFFERENTIAL/PLATELET
Basophils Absolute: 56 cells/uL (ref 0–200)
Basophils Relative: 0.6 %
EOS ABS: 226 {cells}/uL (ref 15–500)
Eosinophils Relative: 2.4 %
HCT: 40.5 % (ref 35.0–45.0)
Hemoglobin: 13.8 g/dL (ref 11.7–15.5)
Lymphs Abs: 3046 cells/uL (ref 850–3900)
MCH: 31.7 pg (ref 27.0–33.0)
MCHC: 34.1 g/dL (ref 32.0–36.0)
MCV: 92.9 fL (ref 80.0–100.0)
MPV: 10.8 fL (ref 7.5–12.5)
Monocytes Relative: 5 %
Neutro Abs: 5602 cells/uL (ref 1500–7800)
Neutrophils Relative %: 59.6 %
PLATELETS: 268 10*3/uL (ref 140–400)
RBC: 4.36 10*6/uL (ref 3.80–5.10)
RDW: 12.8 % (ref 11.0–15.0)
TOTAL LYMPHOCYTE: 32.4 %
WBC: 9.4 10*3/uL (ref 3.8–10.8)
WBCMIX: 470 {cells}/uL (ref 200–950)

## 2017-11-13 LAB — COMPLETE METABOLIC PANEL WITH GFR
AG RATIO: 1.6 (calc) (ref 1.0–2.5)
ALT: 20 U/L (ref 6–29)
AST: 18 U/L (ref 10–35)
Albumin: 4.1 g/dL (ref 3.6–5.1)
Alkaline phosphatase (APISO): 66 U/L (ref 33–115)
BILIRUBIN TOTAL: 0.6 mg/dL (ref 0.2–1.2)
BUN: 13 mg/dL (ref 7–25)
CHLORIDE: 102 mmol/L (ref 98–110)
CO2: 26 mmol/L (ref 20–32)
Calcium: 9.4 mg/dL (ref 8.6–10.2)
Creat: 0.71 mg/dL (ref 0.50–1.10)
GFR, EST AFRICAN AMERICAN: 118 mL/min/{1.73_m2} (ref >=60)
GFR, Est Non African American: 101 mL/min/{1.73_m2} (ref >=60)
Globulin: 2.6 g/dL (calc) (ref 1.9–3.7)
Glucose, Bld: 115 mg/dL — ABNORMAL HIGH (ref 65–99)
POTASSIUM: 4.5 mmol/L (ref 3.5–5.3)
Sodium: 137 mmol/L (ref 135–146)
TOTAL PROTEIN: 6.7 g/dL (ref 6.1–8.1)

## 2017-11-13 LAB — MUMPS ANTIBODY, IGM: Mumps IgM Value: <1:20 {titer}

## 2017-11-13 LAB — AMYLASE: Amylase: 29 U/L (ref 21–101)

## 2017-11-13 LAB — MUMPS ANTIBODY, IGG: MUMPS IGG: 63.8 [AU]/ml

## 2017-11-13 LAB — LIPASE: Lipase: 13 U/L (ref 7–60)

## 2017-12-01 ENCOUNTER — Other Ambulatory Visit: Payer: Self-pay | Admitting: Family Medicine

## 2017-12-01 DIAGNOSIS — F3341 Major depressive disorder, recurrent, in partial remission: Secondary | ICD-10-CM

## 2017-12-08 ENCOUNTER — Ambulatory Visit: Payer: BC Managed Care – PPO | Admitting: Family Medicine

## 2017-12-08 ENCOUNTER — Encounter: Payer: Self-pay | Admitting: Family Medicine

## 2017-12-08 VITALS — BP 128/88 | HR 103 | Ht 66.0 in | Wt 295.0 lb

## 2017-12-08 DIAGNOSIS — L03031 Cellulitis of right toe: Secondary | ICD-10-CM | POA: Diagnosis not present

## 2017-12-08 MED ORDER — DOXYCYCLINE HYCLATE 100 MG PO TABS: 100.0000 mg | ORAL_TABLET | Freq: Two times a day (BID) | ORAL | 0 refills | Status: DC

## 2017-12-08 NOTE — Progress Notes (Signed)
Kaitlyn Kirby is a 47 y.o. female who presents to [facility]: [facility] today for right great toe pain and swelling.  Kaitlyn Kirby had a pedicure Monday, September 10.  She developed redness and pain on the medial great toenail that evening and its worsened today.  She notes pain redness and swelling.  She has pain with ambulation and getting her shoes on.  She is worried she may have an infection.  No fevers chills nausea vomiting or diarrhea.  No treatment tried yet.    ROS as above:  Exam:  BP 128/88   Pulse (!) 103   Ht 5\' 6"  (1.676 m)   Wt 295 lb (133.8 kg)   BMI 47.61 kg/m  Wt Readings from Last 5 Encounters:  12/08/17 295 lb (133.8 kg)  11/11/17 289 lb (131.1 kg)  09/06/17 289 lb (131.1 kg)  08/05/17 293 lb (132.9 kg)  05/31/17 294 lb (133.4 kg)    Gen: Well NAD Right foot well-appearing except for the medial edge of the right great toenail which is erythematous and tender with small amount of discharge.  No fluctuance palpated. Capillary refill and sensation are intact distally.  Pulses are intact in the foot.    Assessment and Plan: 47 y.o. female with right great toenail paronychia.  Plan for empiric treatment with doxycycline oral antibiotics.  Recheck if not improving.  Return sooner if needed.   No orders of the defined types were placed in this encounter.  Meds ordered this encounter  Medications  . doxycycline (VIBRA-TABS) 100 MG tablet    Sig: Take 1 tablet (100 mg total) by mouth 2 (two) times daily.    Dispense:  14 tablet    Refill:  0     Historical information moved to improve visibility of documentation.  Past Medical History:  Diagnosis Date  . Abnormal Pap smear 1995   Colpo  . Allergy   . Asthma    bronchial spasms  . Basal cell carcinoma 2010  . Hemorrhoids   . IBS (irritable bowel syndrome) 8-10 years ago  . Rectal fissure     Past Surgical History:  Procedure Laterality Date  . EYE SURGERY    . SKIN CANCER EXCISION  2010   chest, basal cell  . WISDOM TOOTH EXTRACTION  47 years old   Social History   Tobacco Use  . Smoking status: Former Smoker    Packs/day: 0.50    Last attempt to quit: 11/10/1995    Years since quitting: 22.0  . Smokeless tobacco: Never Used  Substance Use Topics  . Alcohol use: Yes    Comment: occasional   family history includes Diabetes in her brother; Heart attack in her father; Hypertension in her father and mother; Thyroid disease in her mother.  Medications: Current Outpatient Medications  Medication Sig Dispense Refill  . Albuterol Sulfate (PROAIR RESPICLICK) 756 (90 Base) MCG/ACT AEPB Inhale 1 puff into the lungs every 4 (four) hours as needed (wheeze). 1 each 1  . cetirizine (ZYRTEC) 10 MG tablet Take 10 mg by mouth daily.    Marland Kitchen diltiazem 2 % GEL Apply 1 application topically 4 (four) times daily as needed. 30 g 1  . fluticasone (FLONASE) 50 MCG/ACT nasal spray Place into both nostrils daily.    . Phentermine-Topiramate (QSYMIA) 7.5-46 MG CP24 Take 1 tablet by mouth daily. 30 capsule 0  . venlafaxine XR (EFFEXOR-XR) 75 MG 24 hr capsule Take 1 capsule (  75 mg total) by mouth daily with breakfast. 90 capsule 1  . doxycycline (VIBRA-TABS) 100 MG tablet Take 1 tablet (100 mg total) by mouth 2 (two) times daily. 14 tablet 0   No current facility-administered medications for this visit.    Allergies  Allergen Reactions  . Amoxicillin Hives  . Celexa [Citalopram Hydrobromide] Other (See Comments)  . Penicillins Hives  . Fluoxetine Palpitations     Discussed warning signs or symptoms. Please see discharge instructions. Patient expresses understanding.

## 2017-12-08 NOTE — Patient Instructions (Signed)
Thank you for coming in today. Start doxycycline twice daily for 1 week.  Recheck as needed.  Let me know if not getting better.   Paronychia Paronychia is an infection of the skin that surrounds a nail. It usually affects the skin around a fingernail, but it may also occur near a toenail. It often causes pain and swelling around the nail. This condition may come on suddenly or develop over a longer period. In some cases, a collection of pus (abscess) can form near or under the nail. Usually, paronychia is not serious and it clears up with treatment. What are the causes? This condition may be caused by bacteria or fungi. It is commonly caused by either Streptococcus or Staphylococcus bacteria. The bacteria or fungi often cause the infection by getting into the affected area through an opening in the skin, such as a cut or a hangnail. What increases the risk? This condition is more likely to develop in:  People who get their hands wet often, such as those who work as Designer, industrial/product, bartenders, or nurses.  People who bite their fingernails or suck their thumbs.  People who trim their nails too short.  People who have hangnails or injured fingertips.  People who get manicures.  People who have diabetes.  What are the signs or symptoms? Symptoms of this condition include:  Redness and swelling of the skin near the nail.  Tenderness around the nail when you touch the area.  Pus-filled bumps under the cuticle. The cuticle is the skin at the base or sides of the nail.  Fluid or pus under the nail.  Throbbing pain in the area.  How is this diagnosed? This condition is usually diagnosed with a physical exam. In some cases, a sample of pus may be taken from an abscess to be tested in a lab. This can help to determine what type of bacteria or fungi is causing the condition. How is this treated? Treatment for this condition depends on the cause and severity of the condition. If the  condition is mild, it may clear up on its own in a few days. Your health care provider may recommend soaking the affected area in warm water a few times a day. When treatment is needed, the options may include:  Antibiotic medicine, if the condition is caused by a bacterial infection.  Antifungal medicine, if the condition is caused by a fungal infection.  Incision and drainage, if an abscess is present. In this procedure, the health care provider will cut open the abscess so the pus can drain out.  Follow these instructions at home:  Soak the affected area in warm water if directed to do so by your health care provider. You may be told to do this for 20 minutes, 2-3 times a day. Keep the area dry in between soakings.  Take medicines only as directed by your health care provider.  If you were prescribed an antibiotic medicine, finish all of it even if you start to feel better.  Keep the affected area clean.  Do not try to drain a fluid-filled bump yourself.  If you will be washing dishes or performing other tasks that require your hands to get wet, wear rubber gloves. You should also wear gloves if your hands might come in contact with irritating substances, such as cleaners or chemicals.  Follow your health care provider's instructions about: ? Wound care. ? Bandage (dressing) changes and removal. Contact a health care provider if:  Your symptoms get  worse or do not improve with treatment.  You have a fever or chills.  You have redness spreading from the affected area.  You have continued or increased fluid, blood, or pus coming from the affected area.  Your finger or knuckle becomes swollen or is difficult to move. This information is not intended to replace advice given to you by your health care provider. Make sure you discuss any questions you have with your health care provider. Document Released: 09/09/2000 Document Revised: 08/22/2015 Document Reviewed:  02/21/2014 Elsevier Interactive Patient Education  [employer].

## 2017-12-23 ENCOUNTER — Ambulatory Visit (INDEPENDENT_AMBULATORY_CARE_PROVIDER_SITE_OTHER): Payer: BC Managed Care – PPO

## 2017-12-23 ENCOUNTER — Ambulatory Visit: Payer: BC Managed Care – PPO | Admitting: Family Medicine

## 2017-12-23 ENCOUNTER — Encounter: Payer: Self-pay | Admitting: Family Medicine

## 2017-12-23 VITALS — BP 126/76 | HR 95 | Ht 66.0 in | Wt 288.0 lb

## 2017-12-23 DIAGNOSIS — M25571 Pain in right ankle and joints of right foot: Secondary | ICD-10-CM

## 2017-12-23 DIAGNOSIS — K648 Other hemorrhoids: Secondary | ICD-10-CM

## 2017-12-23 DIAGNOSIS — K644 Residual hemorrhoidal skin tags: Secondary | ICD-10-CM | POA: Diagnosis not present

## 2017-12-23 MED ORDER — HYDROCORTISONE ACETATE 25 MG RE SUPP: 25.0000 mg | Freq: Two times a day (BID) | RECTAL | 3 refills | Status: DC

## 2017-12-23 NOTE — Progress Notes (Signed)
Subjective:    Patient ID: Kaitlyn Kirby, female    DOB: 1970/10/17, 47 y.o.   MRN: [id number]  HPI She has a hx of hemorrhoids but they have started bleeding which is unusual for her.  She has been coughing a lot with a URI and thinks that may have exacerbated them.  She said she literally had to put pressure on the area to get them to quit bleeding.  She said she is noticed blood in her underwear at night.  She has not noticed any blood today.  She is been using an external cream and has been doing a couple of sitz bath but says she has not been able to do them consistently.   She also complains of right lateral ankle pain just below the lateral malleolus is been going on for a little over a week.  She does not remember any injury or trauma to the area.  It is tender to touch.  She also complains of bilateral ankle swelling that is been going on for several months.  It usually better in the morning and worse in the evenings.  Sometimes it gets worse than other days but she is not sure what is going on.  No chest pain or shortness of breath.   Review of Systems  BP 126/76   Pulse 95   Ht 5\' 6"  (1.676 m)   Wt 288 lb (130.6 kg)   SpO2 99%   BMI 46.48 kg/m     Allergies  Allergen Reactions  . Amoxicillin Hives  . Celexa [Citalopram Hydrobromide] Other (See Comments)  . Penicillins Hives  . Fluoxetine Palpitations    Past Medical History:  Diagnosis Date  . Abnormal Pap smear 1995   Colpo  . Allergy   . Asthma    bronchial spasms  . Basal cell carcinoma 2010  . Hemorrhoids   . IBS (irritable bowel syndrome) 8-10 years ago  . Rectal fissure     Past Surgical History:  Procedure Laterality Date  . EYE SURGERY    . SKIN CANCER EXCISION  2010   chest, basal cell  . WISDOM TOOTH EXTRACTION  47 years old    Social History   Socioeconomic History  . Marital status: Single    Spouse name: Not on file  . Number of children: Not on file  . Years of education: Not on file   . Highest education level: Not on file  Occupational History  . Not on file  Social Needs  . Financial resource strain: Not on file  . Food insecurity:    Worry: Not on file    Inability: Not on file  . Transportation needs:    Medical: Not on file    Non-medical: Not on file  Tobacco Use  . Smoking status: Former Smoker    Packs/day: 0.50    Last attempt to quit: 11/10/1995    Years since quitting: 22.1  . Smokeless tobacco: Never Used  Substance and Sexual Activity  . Alcohol use: Yes    Comment: occasional  . Drug use: No  . Sexual activity: Yes    Partners: Male    Birth control/protection: Condom  Lifestyle  . Physical activity:    Days per week: Not on file    Minutes per session: Not on file  . Stress: Not on file  Relationships  . Social connections:    Talks on phone: Not on file    Gets together: Not on file  Attends religious service: Not on file    Active member of club or organization: Not on file    Attends meetings of clubs or organizations: Not on file    Relationship status: Not on file  . Intimate partner violence:    Fear of current or ex partner: Not on file    Emotionally abused: Not on file    Physically abused: Not on file    Forced sexual activity: Not on file  Other Topics Concern  . Not on file  Social History Narrative  . Not on file    Family History  Problem Relation Age of Onset  . Hypertension Mother   . Thyroid disease Mother   . Hypertension Father   . Heart attack Father   . Diabetes Brother     Outpatient Encounter Medications as of 12/23/2017  Medication Sig  . Albuterol Sulfate (PROAIR RESPICLICK) 737 (90 Base) MCG/ACT AEPB Inhale 1 puff into the lungs every 4 (four) hours as needed (wheeze).  . cetirizine (ZYRTEC) 10 MG tablet Take 10 mg by mouth daily.  Marland Kitchen diltiazem 2 % GEL Apply 1 application topically 4 (four) times daily as needed.  . fluticasone (FLONASE) 50 MCG/ACT nasal spray Place into both nostrils daily.   . Phentermine-Topiramate (QSYMIA) 7.5-46 MG CP24 Take 1 tablet by mouth daily.  Marland Kitchen venlafaxine XR (EFFEXOR-XR) 75 MG 24 hr capsule Take 1 capsule (75 mg total) by mouth daily with breakfast.  . hydrocortisone (ANUSOL-HC) 25 MG suppository Place 1 suppository (25 mg total) rectally 2 (two) times daily.  . [DISCONTINUED] doxycycline (VIBRA-TABS) 100 MG tablet Take 1 tablet (100 mg total) by mouth 2 (two) times daily.   No facility-administered encounter medications on file as of 12/23/2017.           Objective:   Physical Exam  Constitutional: She is oriented to person, place, and time. She appears well-developed and well-nourished.  HENT:  Head: Normocephalic and atraumatic.  Eyes: Conjunctivae and EOM are normal.  Cardiovascular: Normal rate.  Trace ankle edema bilaterally.  Pulmonary/Chest: Effort normal.  Genitourinary:  Genitourinary Comments: She has several external swollen hemorrhoids.  None are actively bleeding or tears no thrombosis.  Internally she does have evidence of at least 1 bleeding hemorrhoid.  No mass or lesions.  Musculoskeletal:  Right ankle with normal range of motion.  Strength is 5 out of 5.  She is tender just below the lateral malleolus.  Neurological: She is alert and oriented to person, place, and time.  Skin: Skin is dry. No pallor.  Psychiatric: She has a normal mood and affect. Her behavior is normal.  Vitals reviewed.       Assessment & Plan:  Hemorrhoids - Sent over a rx for internal steroid cream.  Hopefully this will be covered by her insurance.  If not the pharmacy can call us back.  Recommend that she do some sitz baths soaks as well.  We will also go ahead and refer to surgery for consultation.  We have actually discussed this previously but that her hemorrhoid hemorrhoid seem to calm down.  Right ankle pain -he is tender just below the lateral malleolus.  Without any injury unclear about the cause.  She says it is painful to walk on it so we  will go ahead and get x-ray today for further evaluation.  Bilateral lower ext edema.  This likely secondary to venous stasis.  No other indication for heart failure etc.  She just had the labs and  renal function about 6 weeks ago.  We discussed avoiding salt, keeping legs elevated when she can, compression stockings and weight loss.

## 2018-01-21 ENCOUNTER — Other Ambulatory Visit: Payer: Self-pay | Admitting: Family Medicine

## 2018-01-21 DIAGNOSIS — Z1231 Encounter for screening mammogram for malignant neoplasm of breast: Secondary | ICD-10-CM

## 2018-02-04 ENCOUNTER — Ambulatory Visit (INDEPENDENT_AMBULATORY_CARE_PROVIDER_SITE_OTHER): Payer: BC Managed Care – PPO | Admitting: Student

## 2018-02-04 ENCOUNTER — Encounter: Payer: Self-pay | Admitting: Student

## 2018-02-04 ENCOUNTER — Ambulatory Visit (INDEPENDENT_AMBULATORY_CARE_PROVIDER_SITE_OTHER): Payer: BC Managed Care – PPO

## 2018-02-04 DIAGNOSIS — N941 Unspecified dyspareunia: Secondary | ICD-10-CM | POA: Diagnosis not present

## 2018-02-04 DIAGNOSIS — Z1231 Encounter for screening mammogram for malignant neoplasm of breast: Secondary | ICD-10-CM | POA: Diagnosis not present

## 2018-02-04 NOTE — Progress Notes (Signed)
  Subjective:     Patient ID: Kaitlyn Kirby, female   DOB: Mar 11, 1971, 47 y.o.   MRN: [id number]  HPI Patient Kaitlyn Kirby is a 47 y.o. G0P0 Here for an annual exam and for vaginal pain post intercourse. Patient had mammo today.  Patient has a history of vaginal tearing post-intercourse; she has had Josph Macho treatment and uses lidocaine post intercourse. Patient is discouraged and frustrated by the pain and the impact on her sexual activity. She has to go long periods of time between intercourse and does not enjoy intercourse because she knows that she will have pain for 2-3 days afterwards. The Lidocaine jelly helps some, but it is still painful.   Review of Systems  Constitutional: Negative.   HENT: Negative.   Respiratory: Negative.   Cardiovascular: Negative.   Gastrointestinal: Negative.   Genitourinary: Positive for vaginal pain.  Neurological: Negative.   Hematological: Negative.   Psychiatric/Behavioral: Negative.        Objective:   Physical Exam BP 126/76 mmHg  Pulse 97  Temp(Src) 98.5 F (36.9 C)  Wt 112 lb (50.803 kg)  LMP 05/09/2014 CONSTITUTIONAL: Well-developed, well-nourished female in no acute distress.  EYES: EOM intact, conjunctivae normal, no scleral icterus HEAD: Normocephalic, atraumatic ENT: External right and left ear normal, oropharynx is clear and moist. CARDIOVASCULAR: Normal heart rate noted, regular rhythm. No cyanosis or edema. 2+ distal pulses.  RESPIRATORY: Clear to auscultation bilaterally. Effort and breath sounds normal, no problems with respiration noted. GASTROINTESTINAL:Soft, normal bowel sounds, no distention noted.  No tenderness, rebound or guarding.  GENITOURINARY: Normal appearing external genitalia; at that introitus there are two reddeded areas at 5 o'clock and 7 o'clock. No fissures, pus. Normal appearing vaginal mucosa and cervix.  Normal appearing discharge.   Normal uterine size, no other palpable masses, no uterine or adnexal  tenderness. Breast symmetric in size. No masses, skin changes, nipple drainage or lymphadenopathy noted.  MUSCULOSKELETAL: Normal range of motion. No tenderness. SKIN: Skin is warm and dry. No rash noted. Not diaphoretic. No erythema. No pallor. [place]wind[place]: Alert and oriented to person, place, and time. Normal reflexes, muscle tone, coordination. No cranial nerve deficit noted. PSYCHIATRIC: Normal mood and affect. Normal behavior. Normal judgment and thought content. HEM/LYMPH/IMMUNOLOGIC: Neck supple, no masses.  Normal thyroid.      Assessment:     1. Dyspareunia in female       Plan:     1. Continue lidocaine jelly post intercourse 2. Encouraged patient to do breast self exams because Mammo cannot detect all cancers.  3. CNM will research options/specialists for patient and make referral.

## 2018-02-04 NOTE — Progress Notes (Signed)
Last pap 11/11/16- negative Mammogram done today

## 2018-02-04 NOTE — Patient Instructions (Addendum)
-provider will look for possible resources and send to patient  Breast Self-Awareness Breast self-awareness means:  Knowing how your breasts look.  Knowing how your breasts feel.  Checking your breasts every month for changes.  Telling your doctor if you notice a change in your breasts.  Breast self-awareness allows you to notice a breast problem early while it is still small. How to do a breast self-exam One way to learn what is normal for your breasts and to check for changes is to do a breast self-exam. To do a breast self-exam: Look for Changes  1. Take off all the clothes above your waist. 2. Stand in front of a mirror in a room with good lighting. 3. Put your hands on your hips. 4. Push your hands down. 5. Look at your breasts and nipples in the mirror to see if one breast or nipple looks different than the other. Check to see if: ? The shape of one breast is different. ? The size of one breast is different. ? There are wrinkles, dips, and bumps in one breast and not the other. 6. Look at each breast for changes in your skin, such as: ? Redness. ? Scaly areas. 7. Look for changes in your nipples, such as: ? Liquid around the nipples. ? Bleeding. ? Dimpling. ? Redness. ? A change in where the nipples are. Feel for Changes 1. Lie on your back on the floor. 2. Feel each breast. To do this, follow these steps: ? Pick a breast to feel. ? Put the arm closest to that breast above your head. ? Use your other arm to feel the nipple area of your breast. Feel the area with the pads of your three middle fingers by making small circles with your fingers. For the first circle, press lightly. For the second circle, press harder. For the third circle, press even harder. ? Keep making circles with your fingers at the light, harder, and even harder pressures as you move down your breast. Stop when you feel your ribs. ? Move your fingers a little toward the center of your body. ? Start  making circles with your fingers again, this time going up until you reach your collarbone. ? Keep making up and down circles until you reach your armpit. Remember to keep using the three pressures. ? Feel the other breast in the same way. 3. Sit or stand in the shower or tub. 4. With soapy water on your skin, feel each breast the same way you did in step 2, when you were lying on the floor. Write Down What You Find  After doing the self-exam, write down:  What is normal for each breast.  Any changes you find in each breast.  When you last had your period.  How often should I check my breasts? Check your breasts every month. If you are breastfeeding, the best time to check them is after you feed your baby or after you use a breast pump. If you get periods, the best time to check your breasts is 5-7 days after your period is over. When should I see my doctor? See your doctor if you notice:  A change in shape or size of your breasts or nipples.  A change in the skin of your breast or nipples, such as red or scaly skin.  Unusual fluid coming from your nipples.  A lump or thick area that was not there before.  Pain in your breasts.  Anything that concerns you.  This information is not intended to replace advice given to you by your health care provider. Make sure you discuss any questions you have with your health care provider. Document Released: 09/02/2007 Document Revised: 08/22/2015 Document Reviewed: 02/03/2015 Elsevier Interactive Patient Education  [employer].

## 2018-02-07 ENCOUNTER — Other Ambulatory Visit: Payer: Self-pay | Admitting: Family Medicine

## 2018-02-07 DIAGNOSIS — R928 Other abnormal and inconclusive findings on diagnostic imaging of breast: Secondary | ICD-10-CM

## 2018-02-16 ENCOUNTER — Ambulatory Visit
Admission: RE | Admit: 2018-02-16 | Discharge: 2018-02-16 | Disposition: A | Discharge status: Home / Self Care | Payer: BC Managed Care – PPO | Source: Ambulatory Visit | Attending: Family Medicine | Admitting: Family Medicine

## 2018-02-16 ENCOUNTER — Other Ambulatory Visit: Payer: Self-pay | Admitting: Family Medicine

## 2018-02-16 DIAGNOSIS — R928 Other abnormal and inconclusive findings on diagnostic imaging of breast: Secondary | ICD-10-CM

## 2018-02-16 DIAGNOSIS — N6310 Unspecified lump in the right breast, unspecified quadrant: Secondary | ICD-10-CM

## 2018-02-22 ENCOUNTER — Ambulatory Visit
Admission: RE | Admit: 2018-02-22 | Discharge: 2018-02-22 | Disposition: A | Discharge status: Home / Self Care | Payer: BC Managed Care – PPO | Source: Ambulatory Visit | Attending: Family Medicine | Admitting: Family Medicine

## 2018-02-22 DIAGNOSIS — N6310 Unspecified lump in the right breast, unspecified quadrant: Secondary | ICD-10-CM

## 2018-02-23 ENCOUNTER — Other Ambulatory Visit: Payer: BC Managed Care – PPO

## 2018-03-16 ENCOUNTER — Ambulatory Visit (INDEPENDENT_AMBULATORY_CARE_PROVIDER_SITE_OTHER): Payer: BC Managed Care – PPO | Admitting: *Deleted

## 2018-03-16 DIAGNOSIS — N898 Other specified noninflammatory disorders of vagina: Secondary | ICD-10-CM

## 2018-03-16 LAB — POCT URINALYSIS DIPSTICK
Glucose, UA: NEGATIVE
Ketones, UA: NEGATIVE
NITRITE UA: NEGATIVE
PH UA: 7 (ref 5.0–8.0)
PROTEIN UA: NEGATIVE
Spec Grav, UA: >=1.03 — AB (ref 1.010–1.025)
UROBILINOGEN UA: NEGATIVE U/dL — AB

## 2018-03-16 NOTE — Progress Notes (Signed)
Pt here with c/o's of vaginal itching and burning x 2 days.  She is doing a self swab and urinalysis.  Will notify pt with results as they become available.

## 2018-03-17 ENCOUNTER — Telehealth: Payer: Self-pay | Admitting: *Deleted

## 2018-03-17 LAB — WET PREP FOR TRICH, YEAST, CLUE
MICRO NUMBER:: 91515259
SPECIMEN QUALITY 3963: ADEQUATE

## 2018-03-17 LAB — URINE CULTURE
MICRO NUMBER: 91515260
SPECIMEN QUALITY:: ADEQUATE

## 2018-03-17 MED ORDER — METRONIDAZOLE 500 MG PO TABS: 500.0000 mg | ORAL_TABLET | Freq: Two times a day (BID) | ORAL | 0 refills | Status: DC

## 2018-03-17 MED ORDER — FLUCONAZOLE 150 MG PO TABS: ORAL_TABLET | ORAL | 0 refills | Status: DC

## 2018-03-17 NOTE — Telephone Encounter (Signed)
Pt notified of wet prep results

## 2018-05-03 ENCOUNTER — Other Ambulatory Visit: Payer: Self-pay | Admitting: Family Medicine

## 2018-05-03 DIAGNOSIS — R059 Cough, unspecified: Secondary | ICD-10-CM

## 2018-05-03 DIAGNOSIS — R05 Cough: Secondary | ICD-10-CM

## 2018-06-06 ENCOUNTER — Other Ambulatory Visit: Payer: Self-pay | Admitting: Family Medicine

## 2018-06-06 DIAGNOSIS — F3341 Major depressive disorder, recurrent, in partial remission: Secondary | ICD-10-CM

## 2018-07-04 ENCOUNTER — Other Ambulatory Visit: Payer: Self-pay | Admitting: Family Medicine

## 2018-07-04 DIAGNOSIS — F3341 Major depressive disorder, recurrent, in partial remission: Secondary | ICD-10-CM

## 2018-07-04 MED ORDER — VENLAFAXINE HCL ER 75 MG PO CP24: 75.0000 mg | ORAL_CAPSULE | Freq: Every day | ORAL | 0 refills | Status: DC

## 2018-07-20 ENCOUNTER — Ambulatory Visit (INDEPENDENT_AMBULATORY_CARE_PROVIDER_SITE_OTHER): Payer: BC Managed Care – PPO | Admitting: Family Medicine

## 2018-07-20 ENCOUNTER — Other Ambulatory Visit: Payer: Self-pay | Admitting: Family Medicine

## 2018-07-20 ENCOUNTER — Encounter: Payer: Self-pay | Admitting: Family Medicine

## 2018-07-20 VITALS — Wt 283.0 lb

## 2018-07-20 DIAGNOSIS — F3341 Major depressive disorder, recurrent, in partial remission: Secondary | ICD-10-CM

## 2018-07-20 DIAGNOSIS — J302 Other seasonal allergic rhinitis: Secondary | ICD-10-CM | POA: Diagnosis not present

## 2018-07-20 DIAGNOSIS — K644 Residual hemorrhoidal skin tags: Secondary | ICD-10-CM | POA: Diagnosis not present

## 2018-07-20 DIAGNOSIS — M6740 Ganglion, unspecified site: Secondary | ICD-10-CM | POA: Diagnosis not present

## 2018-07-20 MED ORDER — VENLAFAXINE HCL ER 75 MG PO CP24: 75.0000 mg | ORAL_CAPSULE | Freq: Every day | ORAL | 1 refills | Status: DC

## 2018-07-20 MED ORDER — HYDROCORTISONE ACETATE 25 MG RE SUPP: 25.0000 mg | Freq: Two times a day (BID) | RECTAL | 3 refills | Status: DC

## 2018-07-20 NOTE — Progress Notes (Signed)
Virtual Visit via Video Note  I connected with Kaitlyn Kirby on 07/20/18 at  4:00 PM EDT by a video enabled telemedicine application and verified that I am speaking with the correct person using two identifiers.   I discussed the limitations of evaluation and management by telemedicine and the availability of in person appointments. The patient expressed understanding and agreed to proceed.  Patient was at home and I was in my office for the video visit.  Subjective:    CC:   HPI:  Follow-up major depressive disorder.  The last time we discussed her mood was actually most a year ago in March 2019 though she has been in for visits for more acute problems.  She is currently on Effexor 75 mg which she has liked better than Zoloft which she had taken previously.  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  She is been trying to keep watching the news to a minimum so it does not overwhelm her.  Hemorrhoids - she needs a refill on her hydrocortisone suppositories.. She hasn't had any really bad flares.  She has a BM 3 times a day.  She says usually if she catches it early she can use 1 of the suppositories and by the next day it feels better.  Seasonal allergies - has had some watery eyes.  Uses her zyrtec daily.  Uses flonase occasionally.   He also has a knot on the base of her right thumb on the palmar side.  She states really not painful or bothersome but she does want me to check it out next time she comes in.  Past medical history, Surgical history, Family history not pertinant except as noted below, Social history, Allergies, and medications have been entered into the medical record, reviewed, and corrections made.   Review of Systems: No fevers, chills, night sweats, weight loss, chest pain, or shortness of breath.   Objective:    General: Speaking clearly in complete sentences without any shortness of breath.  Alert and oriented x3.  Normal judgment. No apparent  acute distress.  I was able to see a little bit of protrusion at the base of the thumb which is possibly a cyst.  Well-groomed.    Impression and Recommendations:   Major depressive disorder -overall she is actually doing really well on the Effexor so we will continue current regimen and refill for the next 6 months.  Follow-up in 6 months.  Seasonal allergies -doing well on her current regimen.  Hemorrhoids.  Will refill hydrocortisone suppositories.  Knot on thumb-most likely a joint cyst.  But will examine further at her appointment did encourage her to schedule at her physical in June as she is actually due for yearly blood work in May.  Hopefully by then the state hormone order will be lifted.     I discussed the assessment and treatment plan with the patient. The patient was provided an opportunity to ask questions and all were answered. The patient agreed with the plan and demonstrated an understanding of the instructions.   The patient was advised to call back or seek an in-person evaluation if the symptoms worsen or if the condition fails to improve as anticipated.   Beatrice Lecher, MD

## 2018-10-17 ENCOUNTER — Other Ambulatory Visit: Payer: Self-pay | Admitting: Family Medicine

## 2018-10-17 DIAGNOSIS — F3341 Major depressive disorder, recurrent, in partial remission: Secondary | ICD-10-CM

## 2018-10-31 ENCOUNTER — Other Ambulatory Visit: Payer: Self-pay

## 2018-10-31 ENCOUNTER — Encounter: Payer: Self-pay | Admitting: Obstetrics & Gynecology

## 2018-10-31 ENCOUNTER — Ambulatory Visit: Payer: BC Managed Care – PPO | Admitting: Obstetrics & Gynecology

## 2018-10-31 VITALS — BP 127/92 | HR 98 | Temp 98.6°F | Resp 16 | Ht 66.0 in | Wt 285.0 lb

## 2018-10-31 DIAGNOSIS — I10 Essential (primary) hypertension: Secondary | ICD-10-CM

## 2018-10-31 DIAGNOSIS — R635 Abnormal weight gain: Secondary | ICD-10-CM

## 2018-10-31 DIAGNOSIS — N914 Secondary oligomenorrhea: Secondary | ICD-10-CM

## 2018-10-31 NOTE — Progress Notes (Signed)
   Subjective:    Patient ID: Kaitlyn Kirby, female    DOB: 02-17-1971, 48 y.o.   MRN: [id number]  HPI  Pt c/o irregular menses.  Has gone 2 months and 3 months this year.  Pt ha negative pregnancy test at home.  Pt doesn't think she has hot flashes or night sweats.  Mask does make her hot.  She does get hot at night but not typical sweats.  Pt has had 40 pound weight gain recently and working to get it off (has lost 10 pounds).  Pt denies hair growth and denies discharge from her breasts.  Review of Systems  Constitutional: Negative.   Respiratory: Positive for shortness of breath.        Attributes to weight gain (with activity)  Cardiovascular: Negative.   Gastrointestinal: Negative.   Genitourinary: Negative.        Objective:   Physical Exam Vitals signs reviewed.  Constitutional:      General: She is not in acute distress.    Appearance: She is well-developed.  HENT:     Head: Normocephalic and atraumatic.  Eyes:     Conjunctiva/sclera: Conjunctivae normal.  Cardiovascular:     Rate and Rhythm: Normal rate.  Pulmonary:     Effort: Pulmonary effort is normal.  Skin:    General: Skin is warm and dry.  Neurological:     Mental Status: She is alert and oriented to person, place, and time.     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  Check hemoglobin A1c. 2.  Blood pressure is elevated and will follow-up with Dr. Charise Carwin. 3.  Check TSH and FSH. 4.  We will use my chart for results and follow-up treatment as indicated. 5.  Pregnancy Beta HCG

## 2018-11-01 LAB — HCG, QUANTITATIVE, PREGNANCY: HCG, Total, QN: <3 m[IU]/mL

## 2018-11-01 LAB — COMPREHENSIVE METABOLIC PANEL
AG Ratio: 1.4 (calc) (ref 1.0–2.5)
ALT: 18 U/L (ref 6–29)
AST: 18 U/L (ref 10–35)
Albumin: 4.2 g/dL (ref 3.6–5.1)
Alkaline phosphatase (APISO): 75 U/L (ref 31–125)
BUN: 8 mg/dL (ref 7–25)
CO2: 25 mmol/L (ref 20–32)
Calcium: 10 mg/dL (ref 8.6–10.2)
Chloride: 100 mmol/L (ref 98–110)
Creat: 0.76 mg/dL (ref 0.50–1.10)
Globulin: 2.9 g/dL (calc) (ref 1.9–3.7)
Glucose, Bld: 142 mg/dL — ABNORMAL HIGH (ref 65–99)
Potassium: 4.3 mmol/L (ref 3.5–5.3)
Sodium: 139 mmol/L (ref 135–146)
Total Bilirubin: 0.9 mg/dL (ref 0.2–1.2)
Total Protein: 7.1 g/dL (ref 6.1–8.1)

## 2018-11-01 LAB — TSH: TSH: 2.8 mIU/L

## 2018-11-01 LAB — FOLLICLE STIMULATING HORMONE: FSH: 4.7 m[IU]/mL

## 2018-11-01 LAB — PROLACTIN: Prolactin: 9.1 ng/mL

## 2018-11-07 ENCOUNTER — Ambulatory Visit: Payer: BC Managed Care – PPO

## 2018-11-07 ENCOUNTER — Other Ambulatory Visit: Payer: Self-pay

## 2018-11-07 DIAGNOSIS — R635 Abnormal weight gain: Secondary | ICD-10-CM

## 2018-11-07 NOTE — Progress Notes (Signed)
Hgb A1c drawn per Dr Gala Romney

## 2018-11-08 LAB — HEMOGLOBIN A1C
Hgb A1c MFr Bld: 7.6 % of total Hgb — ABNORMAL HIGH (ref <5.7)
Mean Plasma Glucose: 171 (calc)
eAG (mmol/L): 9.5 (calc)

## 2018-11-11 ENCOUNTER — Encounter: Payer: Self-pay | Admitting: Obstetrics & Gynecology

## 2018-11-11 DIAGNOSIS — N914 Secondary oligomenorrhea: Secondary | ICD-10-CM | POA: Insufficient documentation

## 2018-11-14 ENCOUNTER — Ambulatory Visit: Payer: BC Managed Care – PPO | Admitting: Family Medicine

## 2018-11-16 ENCOUNTER — Encounter: Payer: Self-pay | Admitting: Family Medicine

## 2018-11-16 ENCOUNTER — Ambulatory Visit (INDEPENDENT_AMBULATORY_CARE_PROVIDER_SITE_OTHER): Payer: BC Managed Care – PPO | Admitting: Family Medicine

## 2018-11-16 ENCOUNTER — Other Ambulatory Visit: Payer: Self-pay

## 2018-11-16 VITALS — BP 135/87 | HR 88 | Temp 98.3°F | Ht 66.0 in | Wt 287.0 lb

## 2018-11-16 DIAGNOSIS — R030 Elevated blood-pressure reading, without diagnosis of hypertension: Secondary | ICD-10-CM | POA: Diagnosis not present

## 2018-11-16 DIAGNOSIS — E119 Type 2 diabetes mellitus without complications: Secondary | ICD-10-CM | POA: Insufficient documentation

## 2018-11-16 DIAGNOSIS — Z23 Encounter for immunization: Secondary | ICD-10-CM | POA: Diagnosis not present

## 2018-11-16 DIAGNOSIS — E1165 Type 2 diabetes mellitus with hyperglycemia: Secondary | ICD-10-CM

## 2018-11-16 MED ORDER — OZEMPIC (0.25 OR 0.5 MG/DOSE) 2 MG/1.5ML ~~LOC~~ SOPN: PEN_INJECTOR | SUBCUTANEOUS | 1 refills | Status: DC

## 2018-11-16 MED ORDER — BLOOD GLUCOSE MONITOR KIT: PACK | 0 refills | Status: AC

## 2018-11-16 NOTE — Assessment & Plan Note (Signed)
New dx. discussed working on healthy diet that is low-carb and low sugar intake along with regular exercise.  Recommendation for 30 minutes of aerobic back activity 5 days/week and recommend that she progressed upward her exercise over the next couple of months.  We also discussed the importance of weight loss.  She actually had a perfectly normal A1c approximately 2 years ago so I do think the weight gain has contributed to this.  We also discussed medication as well as diabetes nutrition counseling.  For now she wants to hold off on counseling until she has a better idea about her schedule.  She is committed to starting a medication and changing her diet and working on weight loss.  I like to see her back in 6 weeks virtually and then will follow-up in 90 days in the office for an A1c.

## 2018-11-16 NOTE — Progress Notes (Addendum)
Established Patient Office Visit  Subjective:  Patient ID: Kaitlyn Kirby, female    DOB: [birth year]  Age: 48 y.o. MRN: [id number]  CC:  Chief Complaint  Patient presents with  . Results    A1c =7.6    HPI Mayer Camel presents for abnormal hemoglobin A1c-she recently saw her OB/GYN and they did some blood work.  They found that her A1c was elevated at 7.6.  She is also noticed recently that her diastolic blood pressure has been elevated in the 90s.  He is also gained some weight recently. 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  Her brother who is a type I diabetic could come to visit and had actually used his glucometer and noted some elevated blood sugar levels at that time.  Past Medical History:  Diagnosis Date  . Abnormal Pap smear 1995   Colpo  . Allergy   . Asthma    bronchial spasms  . Basal cell carcinoma 2010  . Hemorrhoids   . IBS (irritable bowel syndrome) 8-10 years ago  . Rectal fissure     Past Surgical History:  Procedure Laterality Date  . EYE SURGERY    . SKIN CANCER EXCISION  2010   chest, basal cell  . WISDOM TOOTH EXTRACTION  48 years old    Family History  Problem Relation Age of Onset  . Hypertension Mother   . Thyroid disease Mother   . Hypertension Father   . Heart attack Father   . Diabetes Brother        type 1     Social History   Socioeconomic History  . Marital status: Single    Spouse name: Not on file  . Number of children: Not on file  . Years of education: Not on file  . Highest education level: Not on file  Occupational History  . Occupation: Pharmacist, hospital  Social Needs  . Financial resource strain: Not on file  . Food insecurity    Worry: Not on file    Inability: Not on file  . Transportation needs    Medical: Not on file    Non-medical: Not on file  Tobacco Use  . Smoking  status: Former Smoker    Packs/day: 0.50    Quit date: 11/10/1995    Years since quitting: 23.0  . Smokeless tobacco: Never Used  Substance and Sexual Activity  . Alcohol use: Yes    Comment: occasional  . Drug use: No  . Sexual activity: Yes    Partners: Male    Birth control/protection: Condom  Lifestyle  . Physical activity    Days per week: Not on file    Minutes per session: Not on file  . Stress: Not on file  Relationships  . Social Herbalist on phone: Not on file    Gets together: Not on file    Attends religious service: Not on file    Active member of club or organization: Not on file    Attends meetings of clubs or organizations: Not on file    Relationship status: Not on file  . Intimate partner violence    Fear of current or ex partner: Not on file    Emotionally abused: Not on file    Physically abused: Not on file  Forced sexual activity: Not on file  Other Topics Concern  . Not on file  Social History Narrative  . Not on file    Outpatient Medications Prior to Visit  Medication Sig Dispense Refill  . albuterol (PROVENTIL HFA;VENTOLIN HFA) 108 (90 Base) MCG/ACT inhaler Inhale 1 puff into the lungs every 4 (four) hours as needed for wheezing or shortness of breath. 18 g PRN  . cetirizine (ZYRTEC) 10 MG tablet Take 10 mg by mouth daily.    . fluticasone (FLONASE) 50 MCG/ACT nasal spray Place into both nostrils daily.    . hydrocortisone (ANUSOL-HC) 25 MG suppository Place 1 suppository (25 mg total) rectally 2 (two) times daily. 20 suppository 3  . venlafaxine XR (EFFEXOR-XR) 75 MG 24 hr capsule TAKE 1 CAPSULE (75 MG TOTAL) BY MOUTH DAILY WITH BREAKFAST. 90 capsule 0   No facility-administered medications prior to visit.     Allergies  Allergen Reactions  . Amoxicillin Hives  . Celexa [Citalopram Hydrobromide] Other (See Comments)  . Penicillins Hives  . Fluoxetine Palpitations    ROS Review of Systems    Objective:    Physical Exam   Constitutional: She is oriented to person, place, and time. She appears well-developed and well-nourished.  HENT:  Head: Normocephalic and atraumatic.  Cardiovascular: Normal rate, regular rhythm and normal heart sounds.  Pulmonary/Chest: Effort normal and breath sounds normal.  Neurological: She is alert and oriented to person, place, and time.  Skin: Skin is warm and dry.  Psychiatric: She has a normal mood and affect. Her behavior is normal.    BP 135/87   Pulse 88   Temp 98.3 F (36.8 C)   Ht 5' 6" (1.676 m)   Wt 287 lb (130.2 kg)   SpO2 98%   BMI 46.32 kg/m  Wt Readings from Last 3 Encounters:  11/16/18 287 lb (130.2 kg)  10/31/18 285 lb (129.3 kg)  07/20/18 283 lb (128.4 kg)     Health Maintenance Due  Topic Date Due  . PNEUMOCOCCAL POLYSACCHARIDE VACCINE AGE 2-64 HIGH RISK  02/04/1972  . FOOT EXAM  02/04/1980  . OPHTHALMOLOGY EXAM  02/04/1980  . URINE MICROALBUMIN  02/04/1980  . INFLUENZA VACCINE  10/29/2018    There are no preventive care reminders to display for this patient.  Lab Results  Component Value Date   TSH 2.80 10/31/2018   Lab Results  Component Value Date   WBC 9.4 11/11/2017   HGB 13.8 11/11/2017   HCT 40.5 11/11/2017   MCV 92.9 11/11/2017   PLT 268 11/11/2017   Lab Results  Component Value Date   NA 139 10/31/2018   K 4.3 10/31/2018   CO2 25 10/31/2018   GLUCOSE 142 (H) 10/31/2018   BUN 8 10/31/2018   CREATININE 0.76 10/31/2018   BILITOT 0.9 10/31/2018   ALKPHOS 44 11/11/2011   AST 18 10/31/2018   ALT 18 10/31/2018   PROT 7.1 10/31/2018   ALBUMIN 4.2 11/11/2011   CALCIUM 10.0 10/31/2018   Lab Results  Component Value Date   CHOL 195 08/12/2017   Lab Results  Component Value Date   HDL 38 (L) 08/12/2017   Lab Results  Component Value Date   LDLCALC 127 (H) 08/12/2017   Lab Results  Component Value Date   TRIG 178 (H) 08/12/2017   Lab Results  Component Value Date   CHOLHDL 5.1 (H) 08/12/2017   Lab Results   Component Value Date   HGBA1C 7.6 (H) 11/07/2018        Assessment & Plan:   Problem List Items Addressed This Visit      Endocrine   Uncontrolled type 2 diabetes mellitus with hyperglycemia ([place]) - Primary    New dx. discussed working on healthy diet that is low-carb and low sugar intake along with regular exercise.  Recommendation for 30 minutes of aerobic back activity 5 days/week and recommend that she progressed upward her exercise over the next couple of months.  We also discussed the importance of weight loss.  She actually had a perfectly normal A1c approximately 2 years ago so I do think the weight gain has contributed to this.  We also discussed medication as well as diabetes nutrition counseling.  For now she wants to hold off on counseling until she has a better idea about her schedule.  She is committed to starting a medication and changing her diet and working on weight loss.  I like to see her back in 6 weeks virtually and then will follow-up in 90 days in the office for an A1c.      Relevant Medications   blood glucose meter kit and supplies KIT   Semaglutide,0.25 or 0.5MG/DOS, (OZEMPIC, 0.25 OR 0.5 MG/DOSE,) 2 MG/1.5ML SOPN     Other   Severe obesity (BMI >= 40) (HCC)    Discussed the importance of weight loss.  Letter to get her weight back down to at least 240 to 250 pounds where it was a couple of years ago.  And he would make a huge difference in her glucose we had long discussion about that today.  Hopefully the addition of Ozempic will help lower her weight in addition to control her diabetes.      Relevant Medications   Semaglutide,0.25 or 0.5MG/DOS, (OZEMPIC, 0.25 OR 0.5 MG/DOSE,) 2 MG/1.5ML SOPN   Elevated BP without diagnosis of hypertension    Diastolics have been elevated intermittently.  I think working on diet and exercise as well as low-salt diet should improve this we will keep an eye on it over the next 6 to 12 weeks.  If not improving within then we did  discuss starting a blood pressure medication.       Other Visit Diagnoses    Need for immunization against influenza       Relevant Orders   Flu Vaccine QUAD 36+ mos IM (Completed)      Meds ordered this encounter  Medications  . blood glucose meter kit and supplies KIT    Sig: Dispense based on patient and insurance preference. Use up to four times daily as directed. (FOR ICD-9 250.00, 250.01).    Dispense:  1 each    Refill:  0    Order Specific Question:   Number of strips    Answer:   100    Order Specific Question:   Number of lancets    Answer:   100  . Semaglutide,0.25 or 0.5MG/DOS, (OZEMPIC, 0.25 OR 0.5 MG/DOSE,) 2 MG/1.5ML SOPN    Sig: Inject 0.25 mg into the skin once a week for 28 days, THEN 0.5 mg once a week for 28 days.    Dispense:  2 pen    Refill:  1    Follow-up: Return in about 6 weeks (around 12/28/2018), or virtual visit for diabetes.Beatrice Lecher, MD

## 2018-11-16 NOTE — Assessment & Plan Note (Signed)
Discussed the importance of weight loss.  Letter to get her weight back down to at least 240 to 250 pounds where it was a couple of years ago.  And he would make a huge difference in her glucose we had long discussion about that today.  Hopefully the addition of Ozempic will help lower her weight in addition to control her diabetes.

## 2018-11-16 NOTE — Assessment & Plan Note (Signed)
Diastolics have been elevated intermittently.  I think working on diet and exercise as well as low-salt diet should improve this we will keep an eye on it over the next 6 to 12 weeks.  If not improving within then we did discuss starting a blood pressure medication.

## 2018-12-10 ENCOUNTER — Other Ambulatory Visit: Payer: Self-pay | Admitting: Family Medicine

## 2018-12-29 ENCOUNTER — Telehealth (INDEPENDENT_AMBULATORY_CARE_PROVIDER_SITE_OTHER): Payer: BC Managed Care – PPO | Admitting: Family Medicine

## 2018-12-29 ENCOUNTER — Encounter: Payer: Self-pay | Admitting: Family Medicine

## 2018-12-29 VITALS — Ht 66.0 in | Wt 277.0 lb

## 2018-12-29 DIAGNOSIS — R05 Cough: Secondary | ICD-10-CM | POA: Diagnosis not present

## 2018-12-29 DIAGNOSIS — E1165 Type 2 diabetes mellitus with hyperglycemia: Secondary | ICD-10-CM

## 2018-12-29 DIAGNOSIS — F3341 Major depressive disorder, recurrent, in partial remission: Secondary | ICD-10-CM

## 2018-12-29 DIAGNOSIS — R059 Cough, unspecified: Secondary | ICD-10-CM

## 2018-12-29 MED ORDER — ALBUTEROL SULFATE HFA 108 (90 BASE) MCG/ACT IN AERS: 1.0000 | INHALATION_SPRAY | RESPIRATORY_TRACT | 99 refills | Status: DC | PRN

## 2018-12-29 MED ORDER — VENLAFAXINE HCL ER 37.5 MG PO CP24: 37.5000 mg | ORAL_CAPSULE | Freq: Every day | ORAL | 0 refills | Status: DC

## 2018-12-29 NOTE — Progress Notes (Signed)
BS: 122.  She would like to begin to wean off of the Effexor and would like to discuss this.Maryruth Eve, Lahoma Crocker, CMA

## 2018-12-29 NOTE — Assessment & Plan Note (Signed)
Discussed options.  Will taper Effexor.  Alternate 75 mg and 37.5 mg for 10 days then decrease to 37.5mg  daily xv10 days, then decrease to every other day for 10 days and then stop the medication.

## 2018-12-29 NOTE — Patient Instructions (Signed)
Will taper Effexor.  Alternate 75 mg and 37.5 mg for 10 days then decrease to 37.5mg  daily xv10 days, then decrease to every other day for 10 days and then stop the medication.

## 2018-12-29 NOTE — Assessment & Plan Note (Signed)
6 week f/u  - she is doing well. Has lost over 10 lbs on the Ozempic. She is doing great. Exercising more.

## 2018-12-29 NOTE — Progress Notes (Signed)
Virtual Visit via Video Note  I connected with Kaitlyn Kirby on 12/29/18 at  1:20 PM EDT by a video enabled telemedicine application and verified that I am speaking with the correct person using two identifiers.   I discussed the limitations of evaluation and management by telemedicine and the availability of in person appointments. The patient expressed understanding and agreed to proceed.    Established Patient Office Visit  Subjective:  Patient ID: Kaitlyn Kirby, female    DOB: [birth year]  Age: 48 y.o. MRN: [id number]  CC:  Chief Complaint  Patient presents with  . Diabetes    HPI Kaitlyn Kirby presents for   Diabetes - no hypoglycemic events. No wounds or sores that are not healing well. No increased thirst or urination. Checking glucose at home. Taking medications as prescribed without any side effects.  This is actually a new diagnosis and so we had really discussed getting back on track with diet, incorporating regular exercise, and we decided to start Ozempic.   F/U depression -she is interested in weaning off Effexor.  She actually feels like she is doing really well and says that she feels really motivated just to continue work on healthy diet and regular exercise to really help control her mood.  She is currently taking 75 mg daily.  She is also asking for refill on her albuterol.    Past Medical History:  Diagnosis Date  . Abnormal Pap smear 1995   Colpo  . Allergy   . Asthma    bronchial spasms  . Basal cell carcinoma 2010  . Hemorrhoids   . IBS (irritable bowel syndrome) 8-10 years ago  . Rectal fissure     Past Surgical History:  Procedure Laterality Date  . EYE SURGERY    . SKIN CANCER EXCISION  2010   chest, basal cell  . WISDOM TOOTH EXTRACTION  48 years old    Family History  Problem Relation Age of Onset  . Hypertension Mother   . Thyroid disease Mother   . Hypertension Father   . Heart attack Father   . Diabetes Brother        type 1      Social History   Socioeconomic History  . Marital status: Single    Spouse name: Not on file  . Number of children: Not on file  . Years of education: Not on file  . Highest education level: Not on file  Occupational History  . Occupation: Pharmacist, hospital  Social Needs  . Financial resource strain: Not on file  . Food insecurity    Worry: Not on file    Inability: Not on file  . Transportation needs    Medical: Not on file    Non-medical: Not on file  Tobacco Use  . Smoking status: Former Smoker    Packs/day: 0.50    Quit date: 11/10/1995    Years since quitting: 23.1  . Smokeless tobacco: Never Used  Substance and Sexual Activity  . Alcohol use: Yes    Comment: occasional  . Drug use: No  . Sexual activity: Yes    Partners: Male    Birth control/protection: Condom  Lifestyle  . Physical activity    Days per week: Not on file    Minutes per session: Not on file  . Stress: Not on file  Relationships  . Social Herbalist on phone: Not on file    Gets together: Not on file  Attends religious service: Not on file    Active member of club or organization: Not on file    Attends meetings of clubs or organizations: Not on file    Relationship status: Not on file  . Intimate partner violence    Fear of current or ex partner: Not on file    Emotionally abused: Not on file    Physically abused: Not on file    Forced sexual activity: Not on file  Other Topics Concern  . Not on file  Social History Narrative  . Not on file    Outpatient Medications Prior to Visit  Medication Sig Dispense Refill  . blood glucose meter kit and supplies KIT Dispense based on patient and insurance preference. Use up to four times daily as directed. (FOR ICD-9 250.00, 250.01). 1 each 0  . cetirizine (ZYRTEC) 10 MG tablet Take 10 mg by mouth daily.    . fluticasone (FLONASE) 50 MCG/ACT nasal spray Place into both nostrils daily.    . hydrocortisone (ANUSOL-HC) 25 MG suppository  Place 1 suppository (25 mg total) rectally 2 (two) times daily. 20 suppository 3  . OZEMPIC, 0.25 OR 0.5 MG/DOSE, 2 MG/1.5ML SOPN INJECT 0.25 MG INTO THE SKIN ONCE A WEEK FOR 28 DAYS, THEN 0.5 MG ONCE A WEEK FOR 28 DAYS. 1 pen 0  . albuterol (PROVENTIL HFA;VENTOLIN HFA) 108 (90 Base) MCG/ACT inhaler Inhale 1 puff into the lungs every 4 (four) hours as needed for wheezing or shortness of breath. 18 g PRN  . venlafaxine XR (EFFEXOR-XR) 75 MG 24 hr capsule TAKE 1 CAPSULE (75 MG TOTAL) BY MOUTH DAILY WITH BREAKFAST. 90 capsule 0   No facility-administered medications prior to visit.     Allergies  Allergen Reactions  . Amoxicillin Hives  . Celexa [Citalopram Hydrobromide] Other (See Comments)  . Penicillins Hives  . Fluoxetine Palpitations    ROS Review of Systems    Objective:    Physical Exam  Constitutional: She is oriented to person, place, and time. She appears well-developed and well-nourished.  HENT:  Head: Normocephalic and atraumatic.  Eyes: Conjunctivae and EOM are normal.  Pulmonary/Chest: Effort normal.  Neurological: She is alert and oriented to person, place, and time.  Skin: Skin is dry. No pallor.  Psychiatric: She has a normal mood and affect. Her behavior is normal.  Vitals reviewed.   Ht 5' 6"  (1.676 m)   Wt 277 lb (125.6 kg)   BMI 44.71 kg/m  Wt Readings from Last 3 Encounters:  12/29/18 277 lb (125.6 kg)  11/16/18 287 lb (130.2 kg)  10/31/18 285 lb (129.3 kg)     Health Maintenance Due  Topic Date Due  . PNEUMOCOCCAL POLYSACCHARIDE VACCINE AGE 10-64 HIGH RISK  02/04/1972  . FOOT EXAM  02/04/1980  . OPHTHALMOLOGY EXAM  02/04/1980  . URINE MICROALBUMIN  02/04/1980    There are no preventive care reminders to display for this patient.  Lab Results  Component Value Date   TSH 2.80 10/31/2018   Lab Results  Component Value Date   WBC 9.4 11/11/2017   HGB 13.8 11/11/2017   HCT 40.5 11/11/2017   MCV 92.9 11/11/2017   PLT 268 11/11/2017   Lab  Results  Component Value Date   NA 139 10/31/2018   K 4.3 10/31/2018   CO2 25 10/31/2018   GLUCOSE 142 (H) 10/31/2018   BUN 8 10/31/2018   CREATININE 0.76 10/31/2018   BILITOT 0.9 10/31/2018   ALKPHOS 44 11/11/2011   AST  18 10/31/2018   ALT 18 10/31/2018   PROT 7.1 10/31/2018   ALBUMIN 4.2 11/11/2011   CALCIUM 10.0 10/31/2018   Lab Results  Component Value Date   CHOL 195 08/12/2017   Lab Results  Component Value Date   HDL 38 (L) 08/12/2017   Lab Results  Component Value Date   LDLCALC 127 (H) 08/12/2017   Lab Results  Component Value Date   TRIG 178 (H) 08/12/2017   Lab Results  Component Value Date   CHOLHDL 5.1 (H) 08/12/2017   Lab Results  Component Value Date   HGBA1C 7.6 (H) 11/07/2018      Assessment & Plan:   Problem List Items Addressed This Visit      Endocrine   Uncontrolled type 2 diabetes mellitus with hyperglycemia ([place]) - Primary    6 week f/u  - she is doing well. Has lost over 10 lbs on the Ozempic. She is doing great. Exercising more.          Other   Depression    Discussed options.  Will taper Effexor.  Alternate 75 mg and 37.5 mg for 10 days then decrease to 37.72m daily xv10 days, then decrease to every other day for 10 days and then stop the medication.       Relevant Medications   venlafaxine XR (EFFEXOR-XR) 37.5 MG 24 hr capsule    Other Visit Diagnoses    Cough       Relevant Medications   albuterol (VENTOLIN HFA) 108 (90 Base) MCG/ACT inhaler      Meds ordered this encounter  Medications  . albuterol (VENTOLIN HFA) 108 (90 Base) MCG/ACT inhaler    Sig: Inhale 1 puff into the lungs every 4 (four) hours as needed for wheezing or shortness of breath.    Dispense:  18 g    Refill:  PRN    She would like the powdered type of inhaler  . venlafaxine XR (EFFEXOR-XR) 37.5 MG 24 hr capsule    Sig: Take 1 capsule (37.5 mg total) by mouth daily with breakfast.    Dispense:  30 capsule    Refill:  0    Follow-up: Return in  about 6 weeks (around 02/09/2019) for Diabetes follow-up.    I discussed the assessment and treatment plan with the patient. The patient was provided an opportunity to ask questions and all were answered. The patient agreed with the plan and demonstrated an understanding of the instructions.   The patient was advised to call back or seek an in-person evaluation if the symptoms worsen or if the condition fails to improve as anticipated.   CBeatrice Lecher MD

## 2019-01-07 ENCOUNTER — Other Ambulatory Visit: Payer: Self-pay | Admitting: Family Medicine

## 2019-01-17 ENCOUNTER — Other Ambulatory Visit: Payer: Self-pay | Admitting: Family Medicine

## 2019-01-17 DIAGNOSIS — F3341 Major depressive disorder, recurrent, in partial remission: Secondary | ICD-10-CM

## 2019-02-02 ENCOUNTER — Other Ambulatory Visit: Payer: Self-pay | Admitting: Family Medicine

## 2019-02-02 DIAGNOSIS — F3341 Major depressive disorder, recurrent, in partial remission: Secondary | ICD-10-CM

## 2019-02-15 ENCOUNTER — Encounter: Payer: Self-pay | Admitting: Family Medicine

## 2019-02-15 ENCOUNTER — Ambulatory Visit: Payer: BC Managed Care – PPO | Admitting: Family Medicine

## 2019-02-15 ENCOUNTER — Other Ambulatory Visit: Payer: Self-pay

## 2019-02-15 VITALS — BP 120/74 | HR 88 | Ht 70.0 in | Wt 280.0 lb

## 2019-02-15 DIAGNOSIS — F3341 Major depressive disorder, recurrent, in partial remission: Secondary | ICD-10-CM

## 2019-02-15 DIAGNOSIS — E119 Type 2 diabetes mellitus without complications: Secondary | ICD-10-CM | POA: Diagnosis not present

## 2019-02-15 LAB — POCT GLYCOSYLATED HEMOGLOBIN (HGB A1C): Hemoglobin A1C: 6.2 % — AB (ref 4.0–5.6)

## 2019-02-15 NOTE — Progress Notes (Signed)
Established Patient Office Visit  Subjective:  Patient ID: Kaitlyn Kirby, female    DOB: 03/22/1971  Age: 48 y.o. MRN: [id number]  CC:  Chief Complaint  Patient presents with  . Diabetes    HPI Kaitlyn Kirby presents for  Diabetes - no hypoglycemic events. No wounds or sores that are not healing well. No increased thirst or urination. Checking glucose at home. Taking medications as prescribed without any side effects.  She says she really feels great on the Ozempic.  She had some nausea initially but she says now she is really doing great and she is excited about the weight loss.  She says the first 6 weeks after starting it she did great with her diet and exercise but admits she slacked off a little bit.  Follow-up depression-when I last saw her we decided to wean her off of her Effexor she was actually doing really well.  She came down to 37.5 mg daily but did start to get some head zaps sensation as she was tapering down.  She says for now she just wants to stay on the 37.5 for at least a little while longer and then maybe consider tapering down later.     Past Medical History:  Diagnosis Date  . Abnormal Pap smear 1995   Colpo  . Allergy   . Asthma    bronchial spasms  . Basal cell carcinoma 2010  . Hemorrhoids   . IBS (irritable bowel syndrome) 8-10 years ago  . Rectal fissure     Past Surgical History:  Procedure Laterality Date  . EYE SURGERY    . SKIN CANCER EXCISION  2010   chest, basal cell  . WISDOM TOOTH EXTRACTION  48 years old    Family History  Problem Relation Age of Onset  . Hypertension Mother   . Thyroid disease Mother   . Hypertension Father   . Heart attack Father   . Diabetes Brother        type 1     Social History   Socioeconomic History  . Marital status: Single    Spouse name: Not on file  . Number of children: Not on file  . Years of education: Not on file  . Highest education level: Not on file  Occupational History  .  Occupation: Pharmacist, hospital  Social Needs  . Financial resource strain: Not on file  . Food insecurity    Worry: Not on file    Inability: Not on file  . Transportation needs    Medical: Not on file    Non-medical: Not on file  Tobacco Use  . Smoking status: Former Smoker    Packs/day: 0.50    Quit date: 11/10/1995    Years since quitting: 23.2  . Smokeless tobacco: Never Used  Substance and Sexual Activity  . Alcohol use: Yes    Comment: occasional  . Drug use: No  . Sexual activity: Yes    Partners: Male    Birth control/protection: Condom  Lifestyle  . Physical activity    Days per week: Not on file    Minutes per session: Not on file  . Stress: Not on file  Relationships  . Social Herbalist on phone: Not on file    Gets together: Not on file    Attends religious service: Not on file    Active member of club or organization: Not on file    Attends meetings of clubs or organizations:  Not on file    Relationship status: Not on file  . Intimate partner violence    Fear of current or ex partner: Not on file    Emotionally abused: Not on file    Physically abused: Not on file    Forced sexual activity: Not on file  Other Topics Concern  . Not on file  Social History Narrative  . Not on file    Outpatient Medications Prior to Visit  Medication Sig Dispense Refill  . albuterol (VENTOLIN HFA) 108 (90 Base) MCG/ACT inhaler Inhale 1 puff into the lungs every 4 (four) hours as needed for wheezing or shortness of breath. 18 g PRN  . blood glucose meter kit and supplies KIT Dispense based on patient and insurance preference. Use up to four times daily as directed. (FOR ICD-9 250.00, 250.01). 1 each 0  . cetirizine (ZYRTEC) 10 MG tablet Take 10 mg by mouth daily.    . fluticasone (FLONASE) 50 MCG/ACT nasal spray Place into both nostrils daily.    . hydrocortisone (ANUSOL-HC) 25 MG suppository Place 1 suppository (25 mg total) rectally 2 (two) times daily. 20 suppository 3   . Semaglutide,0.25 or 0.5MG/DOS, (OZEMPIC, 0.25 OR 0.5 MG/DOSE,) 2 MG/1.5ML SOPN Inject 0.5 mg into the skin once a week. 3 pen 0  . venlafaxine XR (EFFEXOR-XR) 37.5 MG 24 hr capsule TAKE 1 CAPSULE BY MOUTH DAILY WITH BREAKFAST. 90 capsule 1   No facility-administered medications prior to visit.     Allergies  Allergen Reactions  . Amoxicillin Hives  . Celexa [Citalopram Hydrobromide] Other (See Comments)  . Penicillins Hives  . Fluoxetine Palpitations    ROS Review of Systems    Objective:    Physical Exam  Constitutional: She is oriented to person, place, and time. She appears well-developed and well-nourished.  HENT:  Head: Normocephalic and atraumatic.  Cardiovascular: Normal rate, regular rhythm and normal heart sounds.  Pulmonary/Chest: Effort normal and breath sounds normal.  Neurological: She is alert and oriented to person, place, and time.  Skin: Skin is warm and dry.  Psychiatric: She has a normal mood and affect. Her behavior is normal.    BP 120/74   Pulse 88   Ht 5' 10"  (1.778 m)   Wt 280 lb (127 kg)   SpO2 96%   BMI 40.18 kg/m  Wt Readings from Last 3 Encounters:  02/15/19 280 lb (127 kg)  12/29/18 277 lb (125.6 kg)  11/16/18 287 lb (130.2 kg)     Health Maintenance Due  Topic Date Due  . PNEUMOCOCCAL POLYSACCHARIDE VACCINE AGE 35-64 HIGH RISK  02/04/1972  . URINE MICROALBUMIN  02/04/1980    There are no preventive care reminders to display for this patient.  Lab Results  Component Value Date   TSH 2.80 10/31/2018   Lab Results  Component Value Date   WBC 9.4 11/11/2017   HGB 13.8 11/11/2017   HCT 40.5 11/11/2017   MCV 92.9 11/11/2017   PLT 268 11/11/2017   Lab Results  Component Value Date   NA 139 10/31/2018   K 4.3 10/31/2018   CO2 25 10/31/2018   GLUCOSE 142 (H) 10/31/2018   BUN 8 10/31/2018   CREATININE 0.76 10/31/2018   BILITOT 0.9 10/31/2018   ALKPHOS 44 11/11/2011   AST 18 10/31/2018   ALT 18 10/31/2018   PROT 7.1  10/31/2018   ALBUMIN 4.2 11/11/2011   CALCIUM 10.0 10/31/2018   Lab Results  Component Value Date   CHOL 195 08/12/2017  Lab Results  Component Value Date   HDL 38 (L) 08/12/2017   Lab Results  Component Value Date   LDLCALC 127 (H) 08/12/2017   Lab Results  Component Value Date   TRIG 178 (H) 08/12/2017   Lab Results  Component Value Date   CHOLHDL 5.1 (H) 08/12/2017   Lab Results  Component Value Date   HGBA1C 6.2 (A) 02/15/2019      Assessment & Plan:   Problem List Items Addressed This Visit      Endocrine   Controlled type 2 diabetes mellitus ([place]) - Primary    She is doing phenomenally.  A1c is now down to 6.2 which is fantastic continue Ozempic.  Follow-up in 3 to 4 months.  She can let us know if she is having any problems.  Continue to be diligent about diet and exercise.      Relevant Orders   POCT HgB A1C (Completed)   Lipid panel     Other   Depression    She decided to stay on low-dose Effexor 37.5 mg.  We will make sure that she has refills and will continue with that for now.         No orders of the defined types were placed in this encounter.   Follow-up: Return in about 4 months (around 06/15/2019) for Diabetes follow-up.    Beatrice Lecher, MD

## 2019-02-15 NOTE — Assessment & Plan Note (Signed)
She is doing phenomenally.  A1c is now down to 6.2 which is fantastic continue Ozempic.  Follow-up in 3 to 4 months.  She can let us know if she is having any problems.  Continue to be diligent about diet and exercise.

## 2019-02-15 NOTE — Assessment & Plan Note (Signed)
She decided to stay on low-dose Effexor 37.5 mg.  We will make sure that she has refills and will continue with that for now.

## 2019-02-21 ENCOUNTER — Other Ambulatory Visit: Payer: Self-pay | Admitting: Family Medicine

## 2019-02-21 DIAGNOSIS — Z1231 Encounter for screening mammogram for malignant neoplasm of breast: Secondary | ICD-10-CM

## 2019-03-02 ENCOUNTER — Other Ambulatory Visit: Payer: Self-pay

## 2019-03-02 ENCOUNTER — Ambulatory Visit (INDEPENDENT_AMBULATORY_CARE_PROVIDER_SITE_OTHER): Payer: BC Managed Care – PPO

## 2019-03-02 DIAGNOSIS — Z1231 Encounter for screening mammogram for malignant neoplasm of breast: Secondary | ICD-10-CM

## 2019-04-09 ENCOUNTER — Other Ambulatory Visit: Payer: Self-pay | Admitting: Family Medicine

## 2019-04-19 ENCOUNTER — Other Ambulatory Visit: Payer: Self-pay | Admitting: Family Medicine

## 2019-04-19 ENCOUNTER — Other Ambulatory Visit: Payer: Self-pay

## 2019-04-19 ENCOUNTER — Encounter: Payer: Self-pay | Admitting: Family Medicine

## 2019-04-19 ENCOUNTER — Ambulatory Visit: Payer: BC Managed Care – PPO | Admitting: Family Medicine

## 2019-04-19 VITALS — Ht 70.0 in | Wt 277.0 lb

## 2019-04-19 DIAGNOSIS — K644 Residual hemorrhoidal skin tags: Secondary | ICD-10-CM

## 2019-04-19 DIAGNOSIS — M79645 Pain in left finger(s): Secondary | ICD-10-CM

## 2019-04-19 DIAGNOSIS — L821 Other seborrheic keratosis: Secondary | ICD-10-CM

## 2019-04-19 DIAGNOSIS — D229 Melanocytic nevi, unspecified: Secondary | ICD-10-CM | POA: Diagnosis not present

## 2019-04-19 MED ORDER — DICLOFENAC SODIUM 1 % EX GEL: 2.0000 g | Freq: Four times a day (QID) | CUTANEOUS | 99 refills | Status: DC

## 2019-04-19 NOTE — Progress Notes (Addendum)
Established Patient Office Visit  Subjective:  Patient ID: Kaitlyn Kirby, female    DOB: 01/19/1971  Age: 49 y.o. MRN: [id number]  CC:  Chief Complaint  Patient presents with  . skin tags    HPI Kaitlyn Kirby presents to have a lesion removed on her lower mid back.  She says been there for a while but it was getting a little larger and started really catching on her clothing and pants.  More recently it started bleeding and getting really irritated and she has been using a Band-Aid to keep it covered.  She also has a couple of other crusty brown lesions on her back she would like me to look at today.  She says she also was having significant left thumb pain that started before Christmas.  She says it was really quite painful and tender to touch at the base of the thumb.  She says just gripping things was quite painful.  She really has not tried any oral or over-the-counter medications.  She said when she made the appointment 2 weeks ago it was really bothering her but says it has actually been gradually getting a lot better still little bit sore at times but much better than it was previously.  She denies any known triggers or injuries.  Past Medical History:  Diagnosis Date  . Abnormal Pap smear 1995   Colpo  . Allergy   . Asthma    bronchial spasms  . Basal cell carcinoma 2010  . Hemorrhoids   . IBS (irritable bowel syndrome) 8-10 years ago  . Rectal fissure     Past Surgical History:  Procedure Laterality Date  . EYE SURGERY    . SKIN CANCER EXCISION  2010   chest, basal cell  . WISDOM TOOTH EXTRACTION  49 years old    Family History  Problem Relation Age of Onset  . Hypertension Mother   . Thyroid disease Mother   . Hypertension Father   . Heart attack Father   . Diabetes Brother        type 1     Social History   Socioeconomic History  . Marital status: Single    Spouse name: Not on file  . Number of children: Not on file  . Years of education: Not on file   . Highest education level: Not on file  Occupational History  . Occupation: Pharmacist, hospital  Tobacco Use  . Smoking status: Former Smoker    Packs/day: 0.50    Quit date: 11/10/1995    Years since quitting: 23.4  . Smokeless tobacco: Never Used  Substance and Sexual Activity  . Alcohol use: Yes    Comment: occasional  . Drug use: No  . Sexual activity: Yes    Partners: Male    Birth control/protection: Condom  Other Topics Concern  . Not on file  Social History Narrative  . Not on file   Social Determinants of Health   Financial Resource Strain:   . Difficulty of Paying Living Expenses: Not on file  Food Insecurity:   . Worried About Charity fundraiser in the Last Year: Not on file  . Ran Out of Food in the Last Year: Not on file  Transportation Needs:   . Lack of Transportation (Medical): Not on file  . Lack of Transportation (Non-Medical): Not on file  Physical Activity:   . Days of Exercise per Week: Not on file  . Minutes of Exercise per Session: Not on  file  Stress:   . Feeling of Stress : Not on file  Social Connections:   . Frequency of Communication with Friends and Family: Not on file  . Frequency of Social Gatherings with Friends and Family: Not on file  . Attends Religious Services: Not on file  . Active Member of Clubs or Organizations: Not on file  . Attends Archivist Meetings: Not on file  . Marital Status: Not on file  Intimate Partner Violence:   . Fear of Current or Ex-Partner: Not on file  . Emotionally Abused: Not on file  . Physically Abused: Not on file  . Sexually Abused: Not on file    Outpatient Medications Prior to Visit  Medication Sig Dispense Refill  . albuterol (VENTOLIN HFA) 108 (90 Base) MCG/ACT inhaler Inhale 1 puff into the lungs every 4 (four) hours as needed for wheezing or shortness of breath. 18 g PRN  . blood glucose meter kit and supplies KIT Dispense based on patient and insurance preference. Use up to four times daily  as directed. (FOR ICD-9 250.00, 250.01). 1 each 0  . cetirizine (ZYRTEC) 10 MG tablet Take 10 mg by mouth daily.    . fluticasone (FLONASE) 50 MCG/ACT nasal spray Place into both nostrils daily.    . hydrocortisone (ANUSOL-HC) 25 MG suppository Place 1 suppository (25 mg total) rectally 2 (two) times daily. 20 suppository 3  . OZEMPIC, 0.25 OR 0.5 MG/DOSE, 2 MG/1.5ML SOPN INJECT 0.5 MG INTO THE SKIN ONCE A WEEK. 3 pen 0  . venlafaxine XR (EFFEXOR-XR) 37.5 MG 24 hr capsule TAKE 1 CAPSULE BY MOUTH DAILY WITH BREAKFAST. 90 capsule 1   No facility-administered medications prior to visit.    Allergies  Allergen Reactions  . Amoxicillin Hives  . Celexa [Citalopram Hydrobromide] Other (See Comments)  . Penicillins Hives  . Fluoxetine Palpitations    ROS Review of Systems    Objective:    Physical Exam  Constitutional: She is oriented to person, place, and time. She appears well-developed and well-nourished.  HENT:  Head: Normocephalic and atraumatic.  Eyes: Conjunctivae and EOM are normal.  Cardiovascular: Normal rate.  Pulmonary/Chest: Effort normal.  Musculoskeletal:     Comments: Left thumb with normal range of motion and strength in all directions.  Good grip.  Just a little bit tender over the MCP joint on the palmar side.  Neurological: She is alert and oriented to person, place, and time.  Skin: Skin is dry. No pallor.  Near the mid low back at the waistline level she has an inflamed pink/purpura papular lesion.  Part of it looks lifted up from the skin with some crusted dried blood.  On her right and left flank and upper mid back area she has a fairly large one half seborrheic keratoses.  Psychiatric: She has a normal mood and affect. Her behavior is normal.  Vitals reviewed.   Ht 5' 10"  (1.778 m)   Wt 277 lb (125.6 kg)   BMI 39.75 kg/m  Wt Readings from Last 3 Encounters:  04/19/19 277 lb (125.6 kg)  02/15/19 280 lb (127 kg)  12/29/18 277 lb (125.6 kg)     There  are no preventive care reminders to display for this patient.  There are no preventive care reminders to display for this patient.  Lab Results  Component Value Date   TSH 2.80 10/31/2018   Lab Results  Component Value Date   WBC 9.4 11/11/2017   HGB 13.8 11/11/2017  HCT 40.5 11/11/2017   MCV 92.9 11/11/2017   PLT 268 11/11/2017   Lab Results  Component Value Date   NA 139 10/31/2018   K 4.3 10/31/2018   CO2 25 10/31/2018   GLUCOSE 142 (H) 10/31/2018   BUN 8 10/31/2018   CREATININE 0.76 10/31/2018   BILITOT 0.9 10/31/2018   ALKPHOS 44 11/11/2011   AST 18 10/31/2018   ALT 18 10/31/2018   PROT 7.1 10/31/2018   ALBUMIN 4.2 11/11/2011   CALCIUM 10.0 10/31/2018   Lab Results  Component Value Date   CHOL 195 08/12/2017   Lab Results  Component Value Date   HDL 38 (L) 08/12/2017   Lab Results  Component Value Date   LDLCALC 127 (H) 08/12/2017   Lab Results  Component Value Date   TRIG 178 (H) 08/12/2017   Lab Results  Component Value Date   CHOLHDL 5.1 (H) 08/12/2017   Lab Results  Component Value Date   HGBA1C 6.2 (A) 02/15/2019      Assessment & Plan:   Problem List Items Addressed This Visit      Cardiovascular and Mediastinum   Hemorrhoids, external   Relevant Orders   Ambulatory referral to General Surgery    Other Visit Diagnoses    Pain of left thumb    -  Primary   Seborrheic keratoses       Atypical nevus       Relevant Orders   Surgical pathology     Left thumb pain-suspect tendinitis based on her description.  Exam is pretty benign today.  She certainly could have some osteoarthritis as well.  We discussed we could always get an x-ray if the pain starts to increase or went back up or worsens.  I would recommend a trial of Voltaren gel for the joint I think this could be very helpful especially if it is arthritis.  Seborrheic keratosis-discussed treatment options recommend cryotherapy.  3 lesions were frozen on her back  today.  Atypical nevus-it could possibly just be an inflamed seborrheic keratosis but it has a crusted blood around it and part of it has been pulled up from the skin is quite inflamed.  Recommend shave biopsy and pathologic review.  Patient tolerated procedure well.  Cryotherapy Procedure Note  Pre-operative Diagnosis: Seborrheic Keratoses  Post-operative Diagnosis: same  Locations:  On her back   Indications: None  Anesthesia: not required    Procedure Details  Patient informed of risks (permanent scarring, infection, light or dark discoloration, bleeding, infection, weakness, numbness and recurrence of the lesion) and benefits of the procedure and verbal informed consent obtained.  The areas are treated with liquid nitrogen therapy, frozen until ice ball extended 1-2 mm beyond lesion, allowed to thaw, and treated again. The patient tolerated procedure well.  The patient was instructed on post-op care, warned that there may be blister formation, redness and pain. Recommend OTC analgesia as needed for pain.  Condition: Stable  Complications: none.  Plan: 1. Instructed to keep the area dry and covered for 24-48h and clean thereafter. 2. Warning signs of infection were reviewed.   3. Recommended that the patient use OTC acetaminophen as needed for pain.  4. Return PRN.     Shave Biopsy Procedure Note  Pre-operative Diagnosis: Atypical nevus  Post-operative Diagnosis: same  Locations:low back    Indications: Irritation and bleeding  Anesthesia: Lidocaine 1% with epinephrine without added sodium bicarbonate  Procedure Details  History of allergy to iodine: no  Patient informed  of the risks (including bleeding and infection) and benefits of the  procedure and Verbal informed consent obtained.  The lesion and surrounding area were given a sterile prep using chlorhexidine and draped in the usual sterile fashion. A scalpel was used to shave an area of skin approximately  1cm by 1cm.  Hemostasis achieved with alumuninum chloride. Antibiotic ointment and a sterile dressing applied.  The specimen was sent for pathologic examination. The patient tolerated the procedure well.  EBL: trace  Findings: Await pathology  Condition: Stable  Complications: none.  Plan: 1. Instructed to keep the wound dry and covered for 24-48h and clean thereafter. 2. Warning signs of infection were reviewed.   3. Recommended that the patient use OTC acetaminophen as needed for pain.  4. Return PRN.   Meds ordered this encounter  Medications  . diclofenac Sodium (VOLTAREN) 1 % GEL    Sig: Apply 2 g topically 4 (four) times daily.    Dispense:  100 g    Refill:  PRN    Follow-up: Return if symptoms worsen or fail to improve.    Beatrice Lecher, MD

## 2019-05-02 ENCOUNTER — Ambulatory Visit: Payer: Self-pay | Admitting: Surgery

## 2019-05-02 NOTE — H&P (Signed)
Mayer Camel Documented: 05/02/2019 11:16 AM Location: [facility] Patient #: [id number] DOB: 1971-02-02 Unknown / Language: Cleophus Molt / Race: White Female  History of Present Illness [name] MD; 05/02/2019 12:44 PM) The patient is a 49 year old female who presents with hemorrhoids. Note for "Hemorrhoids": ` ` ` Patient sent for surgical consultation at the request of Dr Madilyn Fireman  Chief Complaint: Worsening hemorrhoids ` ` The patient is a pleasant woman that struggled in rectal issues for some time. I saw her in 2012 for problems with anal fissure and symptomatically hemorrhoids. Improved with diltiazem. She return in 2016 with hemorrhoid irritation. We discussed surgery before she wish to hold off. Returns with more hemorrhoidal complaints. She had a flare last year with bleeding and swelling that took months to recover from. She notes she has worsening external hemorrhoids now. They are irritating. Heart a keep the area clean. Some occasional blood. She is been adjusting to a high fiber diet. Occasionally takes MiraLAX but is not on a daily supplement. Moving her bowels more regularly now. She been hesitant to consider surgery for hemorrhoids but feels like she is exhausted nonoperative management. She comes in to reconsider hemorrhoid surgery.  She also notes that she feels like there is a polyp or irritation on the posterior vaginal wall. Wonders if there is a polyp there that can be removed. Seems to get irritated with coitus. She discussed with her gynecologist that recommended lubricants. However she still has pain and discomfort. Wonders if there is something to remove her treat surgically as well. She asked me to check.   PRIOR NOTE: Patient sent by her primary care physician, Dr. Madilyn Fireman, for concern of worsening hemorrhoids.  Patient with anorectal problems in the past. I diagnosed a fissure over 3 years ago. Recommended diltiazem cream. Lost  to follow-up. She said that helps heal it. Has not had issues until last summer. Had episodes of diarrhea that persisted. Began to get a flare of anal pain. Usually soaks helps him calm down. Change diet and intentionally lost weight. Exercising. Then had worsening bloating after the holidays. He became constipated. Began to get hemorrhoid pain again. Burning after bowel movements for several weeks. Severe pain. Felt a lump. Saw primary care physician. Surgical consultation recommended. S  he is now feeling better for the past few weeks. Used to have bowel movements a few times a week. On MiraLAX intermittently. Now having bowel movements most days. Occasional blood when she wipes but fading away. No Crohn's. No posterior colitis. No irritable bowel syndrome. No prior anorectal interventions. No banding. No surgery.  (Review of systems as stated in this history (HPI) or in the review of systems. Otherwise all other 12 point ROS are negative) ` ` `   Problem List/Past Medical [name], MD; 05/02/2019 11:20 AM) EXTERNAL HEMORRHOID, BLEEDING (K64.4) INFLAMED INTERNAL HEMORRHOID (K64.8)  Past Surgical History Lars Mage Spillers, CMA; 05/02/2019 11:16 AM) Breast Biopsy Right.  Diagnostic Studies History Illene Regulus, CMA; 05/02/2019 11:16 AM) Colonoscopy never Mammogram within last year Pap Smear 1-5 years ago  Allergies [name], MD; 05/02/2019 11:20 AM) Amoxicillin *PENICILLINS* Hives. CeleXA *ANTIDEPRESSANTS*  Medication History (Alisha Spillers, CMA; 05/02/2019 11:19 AM) Hydrocortisone Acetate (25MG  Suppository, Rectal) Active. Venlafaxine HCl ER (37.5MG  Capsule ER 24HR, Oral) Active. Ozempic (0.25 or 0.5 MG/DOSE) (2MG /1.5ML Soln Pen-inj, Subcutaneous) Active. ZyrTEC Allergy (10MG  Tablet, Oral) Active. Medications Reconciled  Social History Illene Regulus, CMA; 05/02/2019 11:16 AM) Alcohol use Occasional alcohol use. Caffeine use  Carbonated beverages, Tea. No drug use Tobacco use Former smoker.  Family History Illene Regulus, CMA; 05/02/2019 11:16 AM) Diabetes Mellitus Brother. Heart Disease Father. Hypertension Brother, Mother. Respiratory Condition Mother. Thyroid problems Mother.  Pregnancy / Birth History Illene Regulus, CMA; 05/02/2019 11:16 AM) Age at menarche 57 years, 73 years. Contraceptive History Oral contraceptives. Gravida 0 Para 0 Regular periods  Other Problems [name], MD; 05/02/2019 11:20 AM) Asthma Back Pain Depression Diabetes Mellitus Hemorrhoids     Review of Systems (Alisha Spillers CMA; 05/02/2019 11:16 AM) General Not Present- Appetite Loss, Chills, Fatigue, Fever, Night Sweats, Weight Gain and Weight Loss. Skin Present- Dryness. Not Present- Change in Wart/Mole, Hives, Jaundice, New Lesions, Non-Healing Wounds, Rash and Ulcer. HEENT Present- Seasonal Allergies. Not Present- Earache, Hearing Loss, Hoarseness, Nose Bleed, Oral Ulcers, Ringing in the Ears, Sinus Pain, Sore Throat, Visual Disturbances, Wears glasses/contact lenses and Yellow Eyes. Respiratory Present- Snoring and Wheezing. Not Present- Bloody sputum, Chronic Cough and Difficulty Breathing. Breast Not Present- Breast Mass, Breast Pain, Nipple Discharge and Skin Changes. Cardiovascular Not Present- Chest Pain, Difficulty Breathing Lying Down, Leg Cramps, Palpitations, Rapid Heart Rate, Shortness of Breath and Swelling of Extremities. Gastrointestinal Present- Hemorrhoids. Not Present- Abdominal Pain, Bloating, Bloody Stool, Change in Bowel Habits, Chronic diarrhea, Constipation, Difficulty Swallowing, Excessive gas, Gets full quickly at meals, Indigestion, Nausea, Rectal Pain and Vomiting. Female Genitourinary Not Present- Frequency, Nocturia, Painful Urination, Pelvic Pain and Urgency. Musculoskeletal Present- Back Pain and Joint Pain. Not Present- Joint Stiffness, Muscle Pain, Muscle Weakness and  Swelling of Extremities. Neurological Not Present- Decreased Memory, Fainting, Headaches, Numbness, Seizures, Tingling, Tremor, Trouble walking and Weakness. Psychiatric Present- Depression. Not Present- Anxiety, Bipolar, Change in Sleep Pattern, Fearful and Frequent crying. Endocrine Present- New Diabetes. Not Present- Cold Intolerance, Excessive Hunger, Hair Changes, Heat Intolerance and Hot flashes. Hematology Not Present- Blood Thinners, Easy Bruising, Excessive bleeding, Gland problems, HIV and Persistent Infections.  Vitals (Alisha Spillers CMA; 05/02/2019 11:17 AM) 05/02/2019 11:17 AM Weight: 276 lb Height: 66in Body Surface Area: 2.29 m Body Mass Index: 44.55 kg/m  Temp.: 2F(Oral)  Pulse: 111 (Regular)  BP: 128/82 (Sitting, Left Arm, Standard)        Physical Exam [name] MD; 05/02/2019 12:44 PM)  General Mental Status-Alert. General Appearance-Not in acute distress, Not Sickly. Orientation-Oriented X3. Hydration-Well hydrated. Voice-Normal.  Integumentary Global Assessment Upon inspection and palpation of skin surfaces of the - Axillae: non-tender, no inflammation or ulceration, no drainage. and Distribution of scalp and body hair is normal. General Characteristics Temperature - normal warmth is noted.  Head and Neck Head-normocephalic, atraumatic with no lesions or palpable masses. Face Global Assessment - atraumatic, no absence of expression. Neck Global Assessment - no abnormal movements, no bruit auscultated on the right, no bruit auscultated on the left, no decreased range of motion, non-tender. Trachea-midline. Thyroid Gland Characteristics - non-tender.  Eye Eyeball - Left-Extraocular movements intact, No Nystagmus - Left. Eyeball - Right-Extraocular movements intact, No Nystagmus - Right. Cornea - Left-No Hazy - Left. Cornea - Right-No Hazy - Right. Sclera/Conjunctiva - Left-No scleral icterus, No Discharge  - Left. Sclera/Conjunctiva - Right-No scleral icterus, No Discharge - Right. Pupil - Left-Direct reaction to light normal. Pupil - Right-Direct reaction to light normal.  ENMT Ears Pinna - Left - no drainage observed, no generalized tenderness observed. Pinna - Right - no drainage observed, no generalized tenderness observed. Nose and Sinuses External Inspection of the Nose - no destructive lesion observed. Inspection of the nares - Left -  quiet respiration. Inspection of the nares - Right - quiet respiration. Mouth and Throat Lips - Upper Lip - no fissures observed, no pallor noted. Lower Lip - no fissures observed, no pallor noted. Nasopharynx - no discharge present. Oral Cavity/Oropharynx - Tongue - no dryness observed. Oral Mucosa - no cyanosis observed. Hypopharynx - no evidence of airway distress observed.  Chest and Lung Exam Inspection Movements - Normal and Symmetrical. Accessory muscles - No use of accessory muscles in breathing. Palpation Palpation of the chest reveals - Non-tender. Auscultation Breath sounds - Normal and Clear.  Cardiovascular Auscultation Rhythm - Regular. Murmurs & Other Heart Sounds - Auscultation of the heart reveals - No Murmurs and No Systolic Clicks.  Abdomen Inspection Inspection of the abdomen reveals - No Visible peristalsis and No Abnormal pulsations. Umbilicus - No Bleeding, No Urine drainage. Palpation/Percussion Palpation and Percussion of the abdomen reveal - Soft, Non Tender, No Rebound tenderness, No Rigidity (guarding) and No Cutaneous hyperesthesia.  Female Genitourinary Sexual Maturity Tanner 5 - Adult hair pattern. Note: No vaginal bleeding nor discharge. Mild flat scar on posterior midline introitus. No obvious stricturing. No polyps. Mild thickening distal left posterior vaginal wall but no definite blistering or stricture. Examination done with female certified medical assistant in the room  Rectal Note: Moderate  size external hemorrhoidal tags especially left lateral and right anterior. Lobulated.   Perianal skin clear. No pilonidal disease. No pruritis. Tolerates in anoscopic exam. Enlarged internal hemorrhoids especially right sided at least grade 2. Some irritation raises suspicion of grade 3. Posterior midline flat scar consistent with prior fissure. Not open. No pilonidal disease. No fissure. No fistula. No abscess. No condyloma..  Peripheral Vascular Upper Extremity Inspection - Left - No Cyanotic nailbeds - Left, Not Ischemic. Inspection - Right - No Cyanotic nailbeds - Right, Not Ischemic.  Neurologic Neurologic evaluation reveals -normal attention span and ability to concentrate, able to name objects and repeat phrases. Appropriate fund of knowledge , normal sensation and normal coordination. Mental Status Affect - not angry, not paranoid. Cranial Nerves-Normal Bilaterally. Gait-Normal.  Neuropsychiatric Mental status exam performed with findings of-able to articulate well with normal speech/language, rate, volume and coherence, thought content normal with ability to perform basic computations and apply abstract reasoning and no evidence of hallucinations, delusions, obsessions or homicidal/suicidal ideation.  Musculoskeletal Global Assessment Spine, Ribs and Pelvis - no instability, subluxation or laxity. Right Upper Extremity - no instability, subluxation or laxity.  Lymphatic Head & Neck General Head & Neck Lymphatics: Bilateral - Description - No Localized lymphadenopathy. Axillary General Axillary Region: Bilateral - Description - No Localized lymphadenopathy. Femoral & Inguinal Generalized Femoral & Inguinal Lymphatics: Left - Description - No Localized lymphadenopathy. Right - Description - No Localized lymphadenopathy.   Results [name] MD; 05/02/2019 12:46 PM) Procedures  Name Value Date Hemorrhoids Procedure Anal exam: External  Hemorrhoid Skin tag Internal exam: Internal Hemorroids ( non-bleeding) Other: Moderate size external hemorrhoidal tags especially left lateral and right anterior. Lobulated..................Marland KitchenPerianal skin clear. No pilonidal disease. No pruritis. Tolerates in anoscopic exam. Enlarged internal hemorrhoids especially right sided at least grade 2. Some irritation raises suspicion of grade 3. Posterior midline flat scar consistent with prior fissure. Not open. No pilonidal disease. No fissure. No fistula. No abscess. No condyloma. Examination done with female CMA in room.  Performed: 05/02/2019 11:49 AM    Assessment & Plan [name] MD; 05/02/2019 12:46 PM)  [facility] Morgan FOR PREOPERATIVE EXAMINATION FOR GENERAL SURGICAL PROCEDURE (Z01.818)  Current Plans You  are being scheduled for surgery- Our schedulers will call you.  You should hear from our office's scheduling department within 5 working days about the location, date, and time of surgery. We try to make accommodations for patient's preferences in scheduling surgery, but sometimes the OR schedule or the surgeon's schedule prevents [place] from making those accommodations.  If you have not heard from our office [phone]) in 5 working days, call the office and ask for your surgeon's nurse.  If you have other questions about your diagnosis, plan, or surgery, call the office and ask for your surgeon's nurse.  The anatomy and the physiology was discussed. The pathophysiology and natural history of the disease was discussed. Options were discussed and recommendations were made. Technique, risks, benefits, & alternatives were discussed. Risks such as stroke, heart attack, bleeding, indection, death, and other risks discussed. Questions answered. The patient agrees to proceed. Pt Education - [facility] Rectal Prep for Anorectal outpatient/office surgery: discussed with patient and provided information. Pt Education - [facility]  Rectal Surgery HCI (Torianna Junio): discussed with patient and provided information. Pt Education - [facility] Good Bowel Health (Katherina Wimer)  EXTERNAL HEMORRHOID, BLEEDING (K64.4) Impression: Her symptoms seem to be external. Likely she had a thrombosed hemorrhoid that has spontaneously resolved. I do not think that these external hemorrhoids will resolve without surgery. However she is less symptomatic now, so we do not need to rush to do anything.  I recommended adjusting her fiber bowel regimen. If she is having increased bloating with MiraLAX, consider switching to flaxseed or different fiber supplement.  Current Plans Pt Education - [facility] Hemorrhoids (Aalijah Lanphere)  INFLAMED INTERNAL HEMORRHOID (K64.8)  Current Plans ANOSCOPY, DIAGNOSTIC (46600)  DYSPAREUNIA, FEMALE (N94.10) Impression: Some posterior vaginal pain irritation usually after sex not improved despite use of lubricants.  I do not see anything to resect or treat at this time. I recommend that she discuss with her gynecologist with f/u exam/visit to see if further interventions or treatments would be of help since lubrication alone is not enough  [name], MD, FACS, MASCRS Gastrointestinal and Minimally Invasive Surgery  [facility] [address], [place], [place] [id number] [phone] Main / Paging [phone] Fax

## 2019-06-22 ENCOUNTER — Other Ambulatory Visit: Payer: Self-pay | Admitting: Surgery

## 2019-06-22 HISTORY — PX: HEMORRHOID SURGERY: SHX153

## 2019-06-29 ENCOUNTER — Other Ambulatory Visit: Payer: Self-pay

## 2019-06-29 ENCOUNTER — Encounter: Payer: Self-pay | Admitting: Medical-Surgical

## 2019-06-29 ENCOUNTER — Ambulatory Visit (INDEPENDENT_AMBULATORY_CARE_PROVIDER_SITE_OTHER): Payer: BC Managed Care – PPO | Admitting: Medical-Surgical

## 2019-06-29 VITALS — BP 154/71 | HR 100 | Temp 98.1°F | Ht 70.0 in | Wt 275.0 lb

## 2019-06-29 DIAGNOSIS — B379 Candidiasis, unspecified: Secondary | ICD-10-CM | POA: Diagnosis not present

## 2019-06-29 DIAGNOSIS — L259 Unspecified contact dermatitis, unspecified cause: Secondary | ICD-10-CM | POA: Diagnosis not present

## 2019-06-29 DIAGNOSIS — R300 Dysuria: Secondary | ICD-10-CM

## 2019-06-29 LAB — POCT URINALYSIS DIP (CLINITEK)
Glucose, UA: NEGATIVE mg/dL
Ketones, POC UA: NEGATIVE mg/dL
Nitrite, UA: NEGATIVE
Spec Grav, UA: >=1.03 — AB (ref 1.010–1.025)
Urobilinogen, UA: 0.2 E.U./dL
pH, UA: 5 (ref 5.0–8.0)

## 2019-06-29 MED ORDER — NITROFURANTOIN MONOHYD MACRO 100 MG PO CAPS: 100.0000 mg | ORAL_CAPSULE | Freq: Two times a day (BID) | ORAL | 0 refills | Status: DC

## 2019-06-29 MED ORDER — FLUCONAZOLE 150 MG PO TABS: 150.0000 mg | ORAL_TABLET | Freq: Once | ORAL | 1 refills | Status: AC

## 2019-06-29 NOTE — Progress Notes (Signed)
Subjective:    CC: Vaginal irritation/itching, dysuria  HPI: Pleasant 49 year old female presenting today with reports of vaginal irritation/itching, dysuria, decreased urinary output and weak urinary stream. Noticed blood on toilet paper when wiping. Had hemorrhoid surgery 1 week ago with onset of the symptoms 2 days ago.  Denies nausea, vomiting, fever, chills, flank pain, suprapubic pressure, urinary odor, vaginal discharge.  Trying to stay hydrated since surgery.  Did receive IV antibiotics during surgery. Has not been checking blood sugars but endorses eating a lot of carbs recently.   Also has a small rash on her left hip there since surgery that started out very red and irritated but has since faded to pink, painless and without itching.  I reviewed the past medical history, family history, social history, surgical history, and allergies today and no changes were needed.  Please see the problem list section below in epic for further details.  Past Medical History: Past Medical History:  Diagnosis Date  . Abnormal Pap smear 1995   Colpo  . Allergy   . Asthma    bronchial spasms  . Basal cell carcinoma 2010  . Hemorrhoids   . IBS (irritable bowel syndrome) 8-10 years ago  . Rectal fissure    Past Surgical History: Past Surgical History:  Procedure Laterality Date  . EYE SURGERY    . SKIN CANCER EXCISION  2010   chest, basal cell  . WISDOM TOOTH EXTRACTION  49 years old   Social History: Social History   Socioeconomic History  . Marital status: Single    Spouse name: Not on file  . Number of children: Not on file  . Years of education: Not on file  . Highest education level: Not on file  Occupational History  . Occupation: Pharmacist, hospital  Tobacco Use  . Smoking status: Former Smoker    Packs/day: 0.50    Quit date: 11/10/1995    Years since quitting: 23.6  . Smokeless tobacco: Never Used  Substance and Sexual Activity  . Alcohol use: Yes    Comment: occasional  .  Drug use: No  . Sexual activity: Yes    Partners: Male    Birth control/protection: Condom  Other Topics Concern  . Not on file  Social History Narrative  . Not on file   Social Determinants of Health   Financial Resource Strain:   . Difficulty of Paying Living Expenses:   Food Insecurity:   . Worried About Charity fundraiser in the Last Year:   . Arboriculturist in the Last Year:   Transportation Needs:   . Film/video editor (Medical):   Marland Kitchen Lack of Transportation (Non-Medical):   Physical Activity:   . Days of Exercise per Week:   . Minutes of Exercise per Session:   Stress:   . Feeling of Stress :   Social Connections:   . Frequency of Communication with Friends and Family:   . Frequency of Social Gatherings with Friends and Family:   . Attends Religious Services:   . Active Member of Clubs or Organizations:   . Attends Archivist Meetings:   Marland Kitchen Marital Status:    Family History: Family History  Problem Relation Age of Onset  . Hypertension Mother   . Thyroid disease Mother   . Hypertension Father   . Heart attack Father   . Diabetes Brother        type 1    Allergies: Allergies  Allergen Reactions  . Amoxicillin Hives  .  Celexa [Citalopram Hydrobromide] Other (See Comments)  . Penicillins Hives  . Fluoxetine Palpitations   Medications: See med rec.  Review of Systems: No fevers, chills, night sweats, weight loss, chest pain, or shortness of breath.   Objective:    General: Well Developed, well nourished, and in no acute distress.  Neuro: Alert and oriented x3.  HEENT: Normocephalic, atraumatic.  Skin: Warm and dry. Dark  Cardiac: Regular rate and rhythm, no murmurs rubs or gallops, no lower extremity edema.  Respiratory: Clear to auscultation bilaterally. Not using accessory muscles, speaking in full sentences. Abdomen: Soft, nondistended. BS +. No CVA tenderness.   Impression and Recommendations:    1. Dysuria POCT UA positive for  small leukocytes, moderate blood, trace protein, and small bilirubin. Negative for nitrites. Sending for culture. Treating empirically with Macrobid due to severity of symptoms. - POCT URINALYSIS DIP (CLINITEK) - nitrofurantoin, macrocrystal-monohydrate, (MACROBID) 100 MG capsule; Take 1 capsule (100 mg total) by mouth 2 (two) times daily.  Dispense: 14 capsule; Refill: 0  2. Yeast infection Diflucan 150mg  once. May repeat dose in 72 hours if symptoms still present. - fluconazole (DIFLUCAN) 150 MG tablet; Take 1 tablet (150 mg total) by mouth once for 1 dose.  Dispense: 1 tablet; Refill: 1  3. Contact dermatitis, unspecified contact dermatitis type, unspecified trigger Improving and asymptomatic. Suspect contact during surgery. If the rash becomes itchy or begins to worsen, may use OTC hydrocortisone cream twice daily. If blisters develop, return to the office for evaluation.  Return if symptoms worsen or fail to improve.  ___________________________________________ Clearnce Sorrel, DNP, APRN, FNP-BC [facility]

## 2019-07-01 LAB — URINE CULTURE
MICRO NUMBER:: 10318246
SPECIMEN QUALITY:: ADEQUATE

## 2019-07-15 ENCOUNTER — Other Ambulatory Visit: Payer: Self-pay | Admitting: Family Medicine

## 2019-07-17 MED ORDER — OZEMPIC (0.25 OR 0.5 MG/DOSE) 2 MG/1.5ML ~~LOC~~ SOPN: 0.5000 mg | PEN_INJECTOR | SUBCUTANEOUS | 0 refills | Status: DC

## 2019-07-28 ENCOUNTER — Telehealth: Payer: Self-pay | Admitting: Family Medicine

## 2019-07-28 NOTE — Telephone Encounter (Signed)
Please call patient: Because of her diagnosis of diabetes current guidelines recommend that she be on a daily statin at bedtime to reduce her risk for cardiovascular disease including heart attack and stroke.  I would encourage her to strongly consider starting this medication and if she is okay with it please let me know and I will send a prescription to her pharmacy.

## 2019-07-31 NOTE — Telephone Encounter (Signed)
Has DM follow up on 08/02/19, wants to discuss this with you then

## 2019-08-02 ENCOUNTER — Ambulatory Visit (INDEPENDENT_AMBULATORY_CARE_PROVIDER_SITE_OTHER): Payer: BC Managed Care – PPO | Admitting: Family Medicine

## 2019-08-02 ENCOUNTER — Other Ambulatory Visit: Payer: Self-pay

## 2019-08-02 ENCOUNTER — Encounter: Payer: Self-pay | Admitting: Family Medicine

## 2019-08-02 ENCOUNTER — Telehealth: Payer: Self-pay | Admitting: Family Medicine

## 2019-08-02 VITALS — BP 119/80 | HR 79 | Ht 66.0 in | Wt 277.0 lb

## 2019-08-02 DIAGNOSIS — E119 Type 2 diabetes mellitus without complications: Secondary | ICD-10-CM | POA: Diagnosis not present

## 2019-08-02 DIAGNOSIS — E785 Hyperlipidemia, unspecified: Secondary | ICD-10-CM | POA: Diagnosis not present

## 2019-08-02 DIAGNOSIS — F3341 Major depressive disorder, recurrent, in partial remission: Secondary | ICD-10-CM

## 2019-08-02 LAB — POCT GLYCOSYLATED HEMOGLOBIN (HGB A1C): Hemoglobin A1C: 6 % — AB (ref 4.0–5.6)

## 2019-08-02 MED ORDER — OZEMPIC (0.25 OR 0.5 MG/DOSE) 2 MG/1.5ML ~~LOC~~ SOPN: 0.5000 mg | PEN_INJECTOR | SUBCUTANEOUS | 3 refills | Status: DC

## 2019-08-02 NOTE — Assessment & Plan Note (Signed)
A1c looks fantastic she is really doing well on Ozempic.  She would like a 90-day supply sent to her pharmacy.  She really would benefit from a statin.

## 2019-08-02 NOTE — Assessment & Plan Note (Signed)
Feels like overall her mood is well controlled.  She says that she felt a little bit down after her recent surgery where she just was not able to get out and do a lot but feels like things are actually getting back to normal and that really helped.  Continue with Effexor daily.

## 2019-08-02 NOTE — Telephone Encounter (Signed)
Request completed and faxed

## 2019-08-02 NOTE — Progress Notes (Signed)
Established Patient Office Visit  Subjective:  Patient ID: Kaitlyn Kirby, female    DOB: 02/08/1971  Age: 49 y.o. MRN: [id number]  CC:  Chief Complaint  Patient presents with  . Diabetes    HPI Kaitlyn Kirby presents for   Diabetes - no hypoglycemic events. No wounds or sores that are not healing well. No increased thirst or urination. Checking glucose at home. Taking medications as prescribed without any side effects.    He also recently reached out to her about possibly starting a statin and the potential benefits for patients who have diabetes.  We did quickly check her right eye she felt like she got something in it this morning.  Reports that she had surgery about 6 weeks ago.  She says that her mother helped her.  But she is recovering and doing well and everything has gone great.  Past Medical History:  Diagnosis Date  . Abnormal Pap smear 1995   Colpo  . Allergy   . Asthma    bronchial spasms  . Basal cell carcinoma 2010  . Hemorrhoids   . IBS (irritable bowel syndrome) 8-10 years ago  . Rectal fissure     Past Surgical History:  Procedure Laterality Date  . EYE SURGERY    . SKIN CANCER EXCISION  2010   chest, basal cell  . WISDOM TOOTH EXTRACTION  49 years old    Family History  Problem Relation Age of Onset  . Hypertension Mother   . Thyroid disease Mother   . Hypertension Father   . Heart attack Father   . Diabetes Brother        type 1     Social History   Socioeconomic History  . Marital status: Single    Spouse name: Not on file  . Number of children: Not on file  . Years of education: Not on file  . Highest education level: Not on file  Occupational History  . Occupation: Pharmacist, hospital  Tobacco Use  . Smoking status: Former Smoker    Packs/day: 0.50    Quit date: 11/10/1995    Years since quitting: 23.7  . Smokeless tobacco: Never Used  Substance and Sexual Activity  . Alcohol use: Yes    Comment: occasional  . Drug use: No  . Sexual  activity: Yes    Partners: Male    Birth control/protection: Condom  Other Topics Concern  . Not on file  Social History Narrative  . Not on file   Social Determinants of Health   Financial Resource Strain:   . Difficulty of Paying Living Expenses:   Food Insecurity:   . Worried About Charity fundraiser in the Last Year:   . Arboriculturist in the Last Year:   Transportation Needs:   . Film/video editor (Medical):   Marland Kitchen Lack of Transportation (Non-Medical):   Physical Activity:   . Days of Exercise per Week:   . Minutes of Exercise per Session:   Stress:   . Feeling of Stress :   Social Connections:   . Frequency of Communication with Friends and Family:   . Frequency of Social Gatherings with Friends and Family:   . Attends Religious Services:   . Active Member of Clubs or Organizations:   . Attends Archivist Meetings:   Marland Kitchen Marital Status:   Intimate Partner Violence:   . Fear of Current or Ex-Partner:   . Emotionally Abused:   Marland Kitchen Physically Abused:   .  Sexually Abused:     Outpatient Medications Prior to Visit  Medication Sig Dispense Refill  . albuterol (VENTOLIN HFA) 108 (90 Base) MCG/ACT inhaler Inhale 1 puff into the lungs every 4 (four) hours as needed for wheezing or shortness of breath. 18 g PRN  . blood glucose meter kit and supplies KIT Dispense based on patient and insurance preference. Use up to four times daily as directed. (FOR ICD-9 250.00, 250.01). 1 each 0  . cetirizine (ZYRTEC) 10 MG tablet Take 10 mg by mouth daily.    . fluticasone (FLONASE) 50 MCG/ACT nasal spray Place into both nostrils daily.    Marland Kitchen venlafaxine XR (EFFEXOR-XR) 37.5 MG 24 hr capsule TAKE 1 CAPSULE BY MOUTH DAILY WITH BREAKFAST. 90 capsule 1  . Semaglutide,0.25 or 0.5MG/DOS, (OZEMPIC, 0.25 OR 0.5 MG/DOSE,) 2 MG/1.5ML SOPN Inject 0.5 mg into the skin once a week. 3 pen 0   No facility-administered medications prior to visit.    Allergies  Allergen Reactions  .  Amoxicillin Hives  . Celexa [Citalopram Hydrobromide] Other (See Comments)  . Penicillins Hives  . Fluoxetine Palpitations    ROS Review of Systems    Objective:    Physical Exam  BP 119/80   Pulse 79   Ht 5' 6"  (1.676 m)   Wt 277 lb (125.6 kg)   SpO2 99%   BMI 44.71 kg/m  Wt Readings from Last 3 Encounters:  08/02/19 277 lb (125.6 kg)  06/29/19 275 lb (124.7 kg)  04/19/19 277 lb (125.6 kg)     Health Maintenance Due  Topic Date Due  . OPHTHALMOLOGY EXAM  Never done  . COVID-19 Vaccine (1) Never done    There are no preventive care reminders to display for this patient.  Lab Results  Component Value Date   TSH 2.80 10/31/2018   Lab Results  Component Value Date   WBC 9.4 11/11/2017   HGB 13.8 11/11/2017   HCT 40.5 11/11/2017   MCV 92.9 11/11/2017   PLT 268 11/11/2017   Lab Results  Component Value Date   NA 139 10/31/2018   K 4.3 10/31/2018   CO2 25 10/31/2018   GLUCOSE 142 (H) 10/31/2018   BUN 8 10/31/2018   CREATININE 0.76 10/31/2018   BILITOT 0.9 10/31/2018   ALKPHOS 44 11/11/2011   AST 18 10/31/2018   ALT 18 10/31/2018   PROT 7.1 10/31/2018   ALBUMIN 4.2 11/11/2011   CALCIUM 10.0 10/31/2018   Lab Results  Component Value Date   CHOL 195 08/12/2017   Lab Results  Component Value Date   HDL 38 (L) 08/12/2017   Lab Results  Component Value Date   LDLCALC 127 (H) 08/12/2017   Lab Results  Component Value Date   TRIG 178 (H) 08/12/2017   Lab Results  Component Value Date   CHOLHDL 5.1 (H) 08/12/2017   Lab Results  Component Value Date   HGBA1C 6.0 (A) 08/02/2019      Assessment & Plan:   Problem List Items Addressed This Visit      Endocrine   Controlled type 2 diabetes mellitus ([place]) - Primary    A1c looks fantastic she is really doing well on Ozempic.  She would like a 90-day supply sent to her pharmacy.  She really would benefit from a statin.      Relevant Medications   Semaglutide,0.25 or 0.5MG/DOS, (OZEMPIC, 0.25  OR 0.5 MG/DOSE,) 2 MG/1.5ML SOPN   Other Relevant Orders   POCT glycosylated hemoglobin (Hb A1C) (Completed)  COMPLETE METABOLIC PANEL WITH GFR     Other   Hyperlipidemia   Relevant Orders   Lipid panel   COMPLETE METABOLIC PANEL WITH GFR   Depression    Feels like overall her mood is well controlled.  She says that she felt a little bit down after her recent surgery where she just was not able to get out and do a lot but feels like things are actually getting back to normal and that really helped.  Continue with Effexor daily.         Meds ordered this encounter  Medications  . Semaglutide,0.25 or 0.5MG/DOS, (OZEMPIC, 0.25 OR 0.5 MG/DOSE,) 2 MG/1.5ML SOPN    Sig: Inject 0.5 mg into the skin once a week.    Dispense:  12 pen    Refill:  3    Follow-up: Return in about 3 months (around 11/02/2019) for Diabetes follow-up.    Beatrice Lecher, MD

## 2019-08-02 NOTE — Telephone Encounter (Signed)
She states she had the surgery at the [facility].

## 2019-08-02 NOTE — Telephone Encounter (Signed)
Call patient: I know that I saw her today and went back to look to see if I had any records of her surgical notes.  I could not find any in her chart.  If she is okay with it I had like to get a copy of her surgical report to just file in her chart.  Please just let us know where she had the surgery done under Dr. Johney Maine was the surgeon but I was not sure if maybe it was like the day surgery center something since I cannot see the notes in epic.

## 2019-08-02 NOTE — Telephone Encounter (Signed)
Kaitlyn Kirby, can we request records?

## 2019-08-02 NOTE — Telephone Encounter (Signed)
Left a message for a return call.

## 2019-08-09 ENCOUNTER — Other Ambulatory Visit: Payer: Self-pay | Admitting: Family Medicine

## 2019-08-09 DIAGNOSIS — F3341 Major depressive disorder, recurrent, in partial remission: Secondary | ICD-10-CM

## 2019-08-10 ENCOUNTER — Encounter: Payer: Self-pay | Admitting: Family Medicine

## 2019-08-10 IMAGING — MG DIGITAL DIAGNOSTIC UNILATERAL RIGHT MAMMOGRAM WITH TOMO AND CAD
4 series · 4 of 12 positions shown · non-contrast
Comparison: Previous exam(s).

CLINICAL DATA: Possible mass in the anterior aspect of the
upper-outer right breast on a recent screening mammogram.

EXAM:
DIGITAL DIAGNOSTIC RIGHT MAMMOGRAM WITH CAD AND TOMO
ULTRASOUND RIGHT BREAST

[R MLO synth-2D]
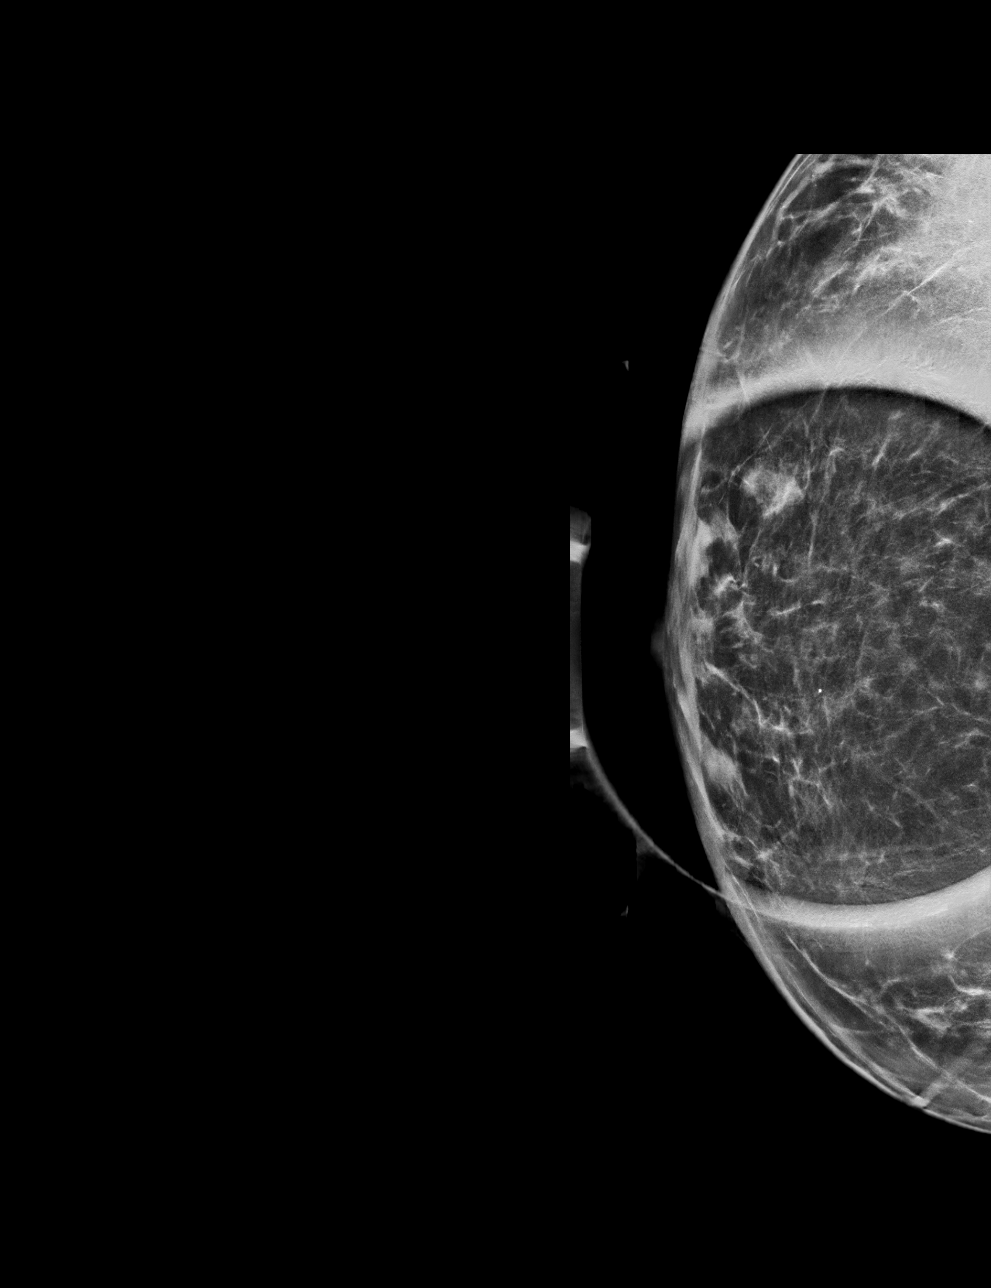

[R CC synth-2D]
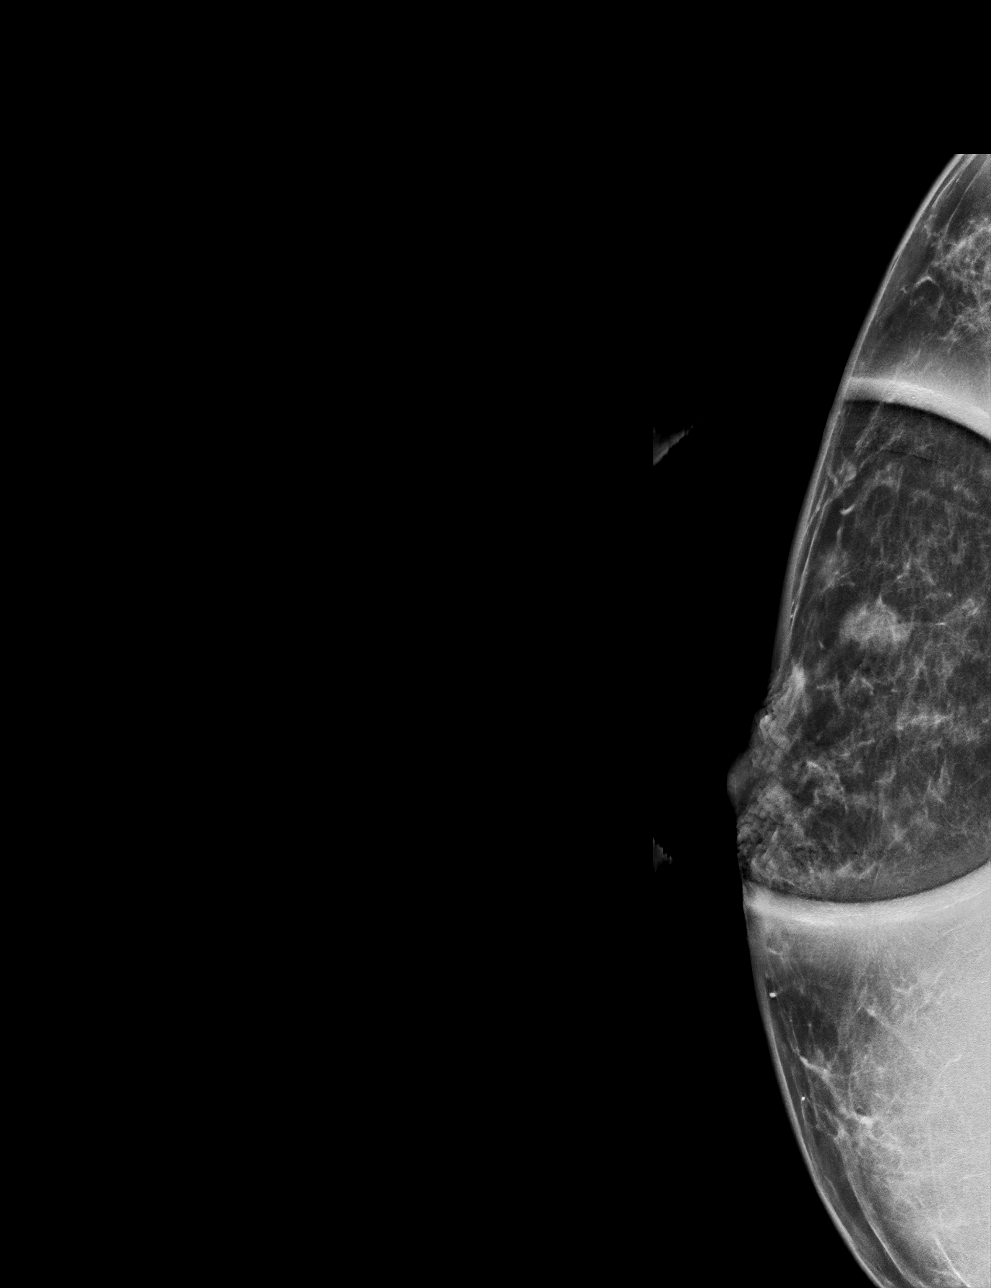

[R MLO tomo · tomo slice 37/74.0]
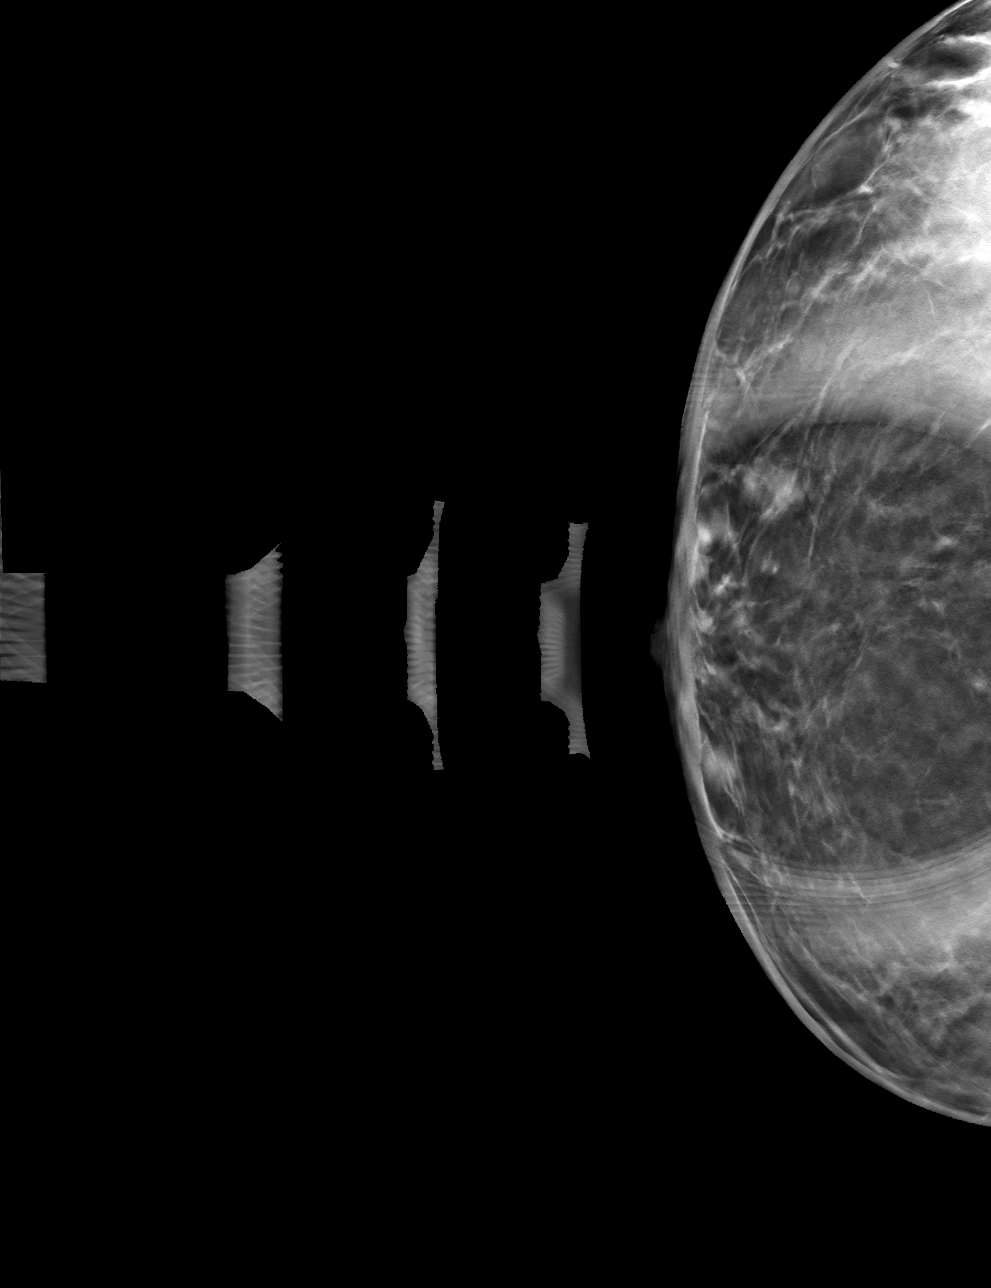

[R CC tomo · tomo slice 29/57.0]
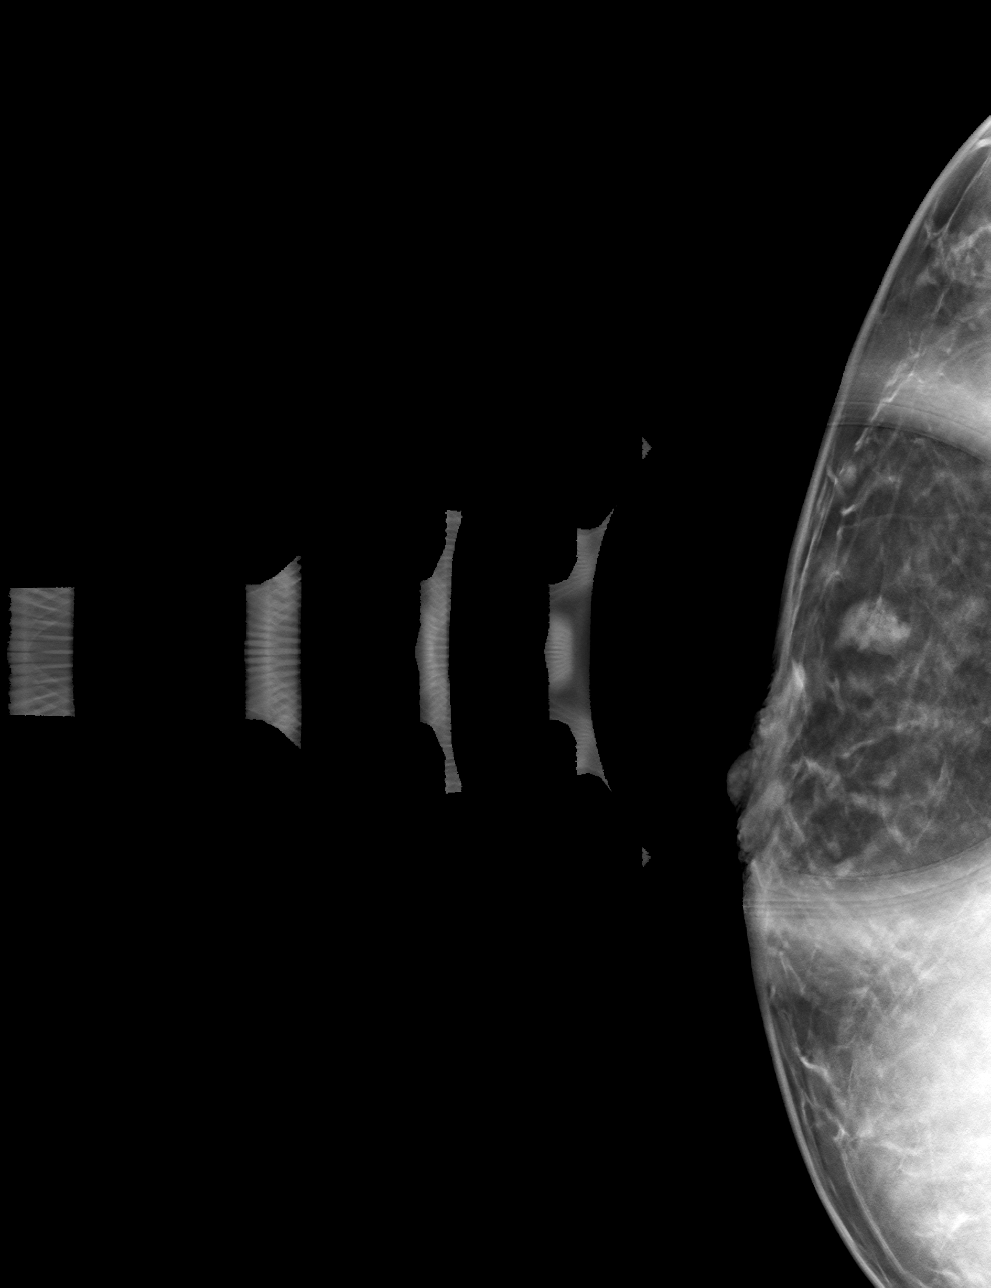

[4 of 12 positions shown; findings below may reference images not displayed]

ACR Breast Density Category b: There are scattered areas of
fibroglandular density.
FINDINGS: 3D tomographic and 2D generated spot compression views of the right
breast confirm an oval mass with some circumscribed and some
obscured or indistinct margins in the anterior aspect of the
upper-outer right breast.

Mammographic images were processed with CAD.

On physical exam, no mass is palpable in the anterior upper outer
right breast or in the right axilla.

Targeted ultrasound is performed, showing a 1.3 x 0.7 x 0.5 cm oval,
horizontally oriented, mildly hypoechoic mass with some thin
internal septations in the 10 o'clock position of the right breast
in the periareolar region of the breast. This has circumscribed,
macrolobulated margins. No definite internal blood flow was seen
with power Doppler.

Ultrasound of the right axilla demonstrates normal appearing right
axillary lymph nodes.
IMPRESSION: 1.3 cm indeterminate mass in the 10 o'clock periareolar right
breast. Differential considerations include fibroadenoma, papillary
lesion and malignancy.

RECOMMENDATION:
Ultrasound-guided core needle biopsy of the 0.3 cm mass in the 10
o'clock periareolar right breast. This has been discussed with the
patient and scheduled at [DATE] p.m. on 02/23/2018.

I have discussed the findings and recommendations with the patient.
Results were also provided in writing at the conclusion of the
visit. If applicable, a reminder letter will be sent to the patient
regarding the next appointment.

BI-RADS CATEGORY  4: Suspicious.

## 2019-08-16 IMAGING — US US BREAST BX W LOC DEV 1ST LESION IMG BX SPEC US GUIDE*R*
1 series · 9 of 9 positions shown · non-contrast
Comparison: Previous exam(s).

Addendum:
CLINICAL DATA: 47-year-old female presenting for ultrasound-guided
biopsy of a right breast mass.

EXAM:
ULTRASOUND GUIDED RIGHT BREAST CORE NEEDLE BIOPSY

[Series 1: us breast bx w loc dev 1st lesion img bx spec us g · 0.07mm/px · 9 of 9 slices shown]
[im 1/9]
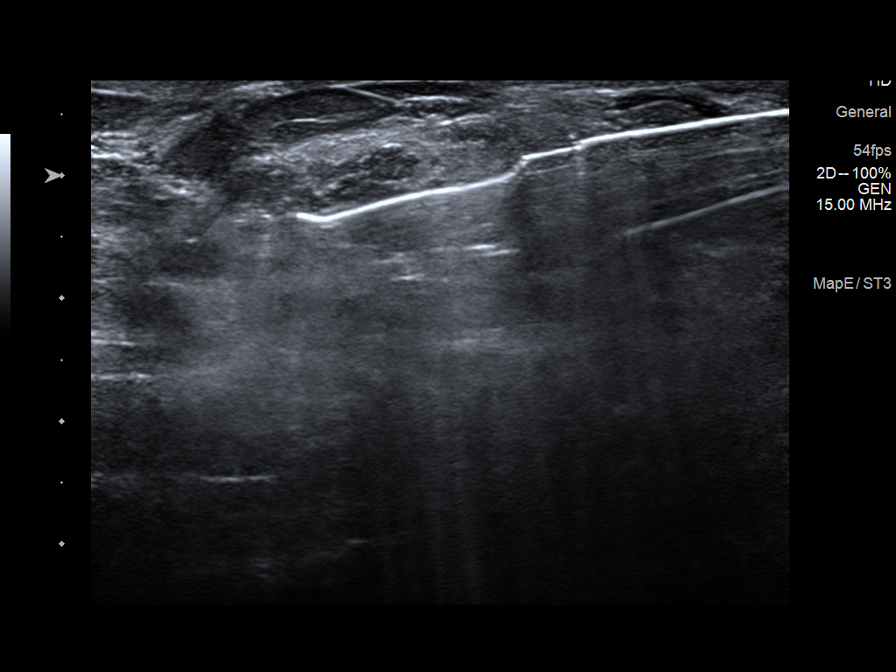
[im 2/9]
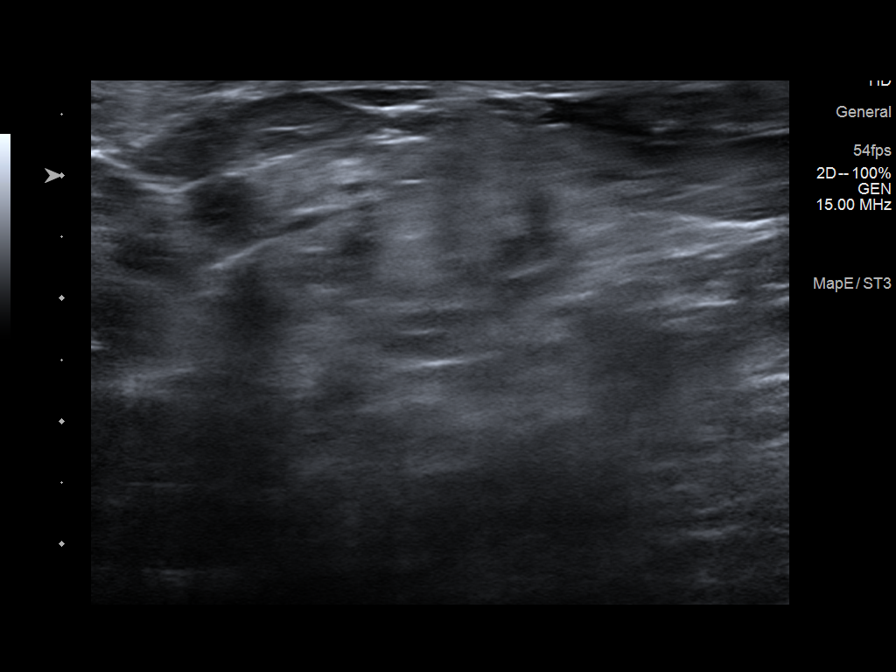
[im 3/9]
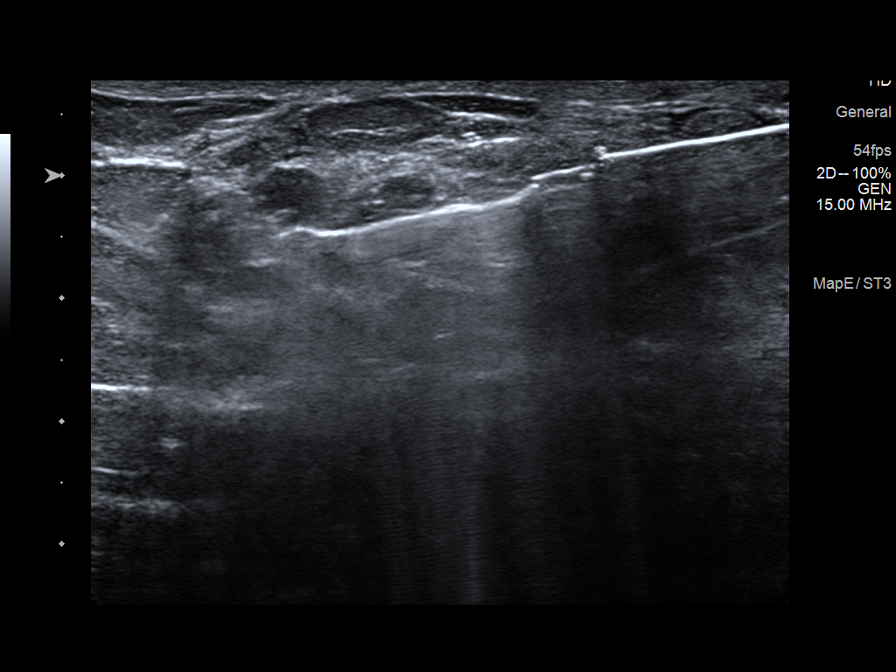
[im 4/9]
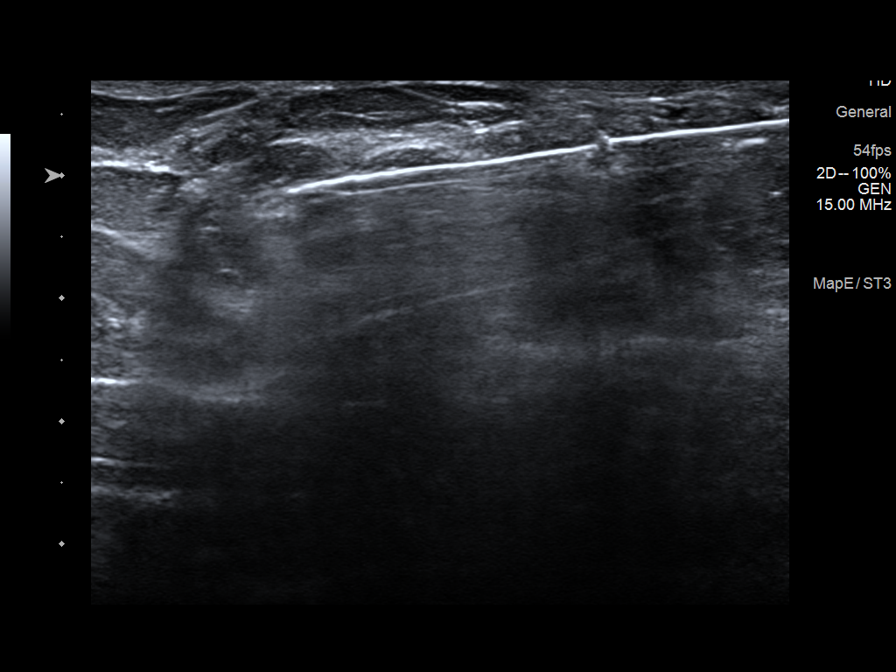
[im 5/9]
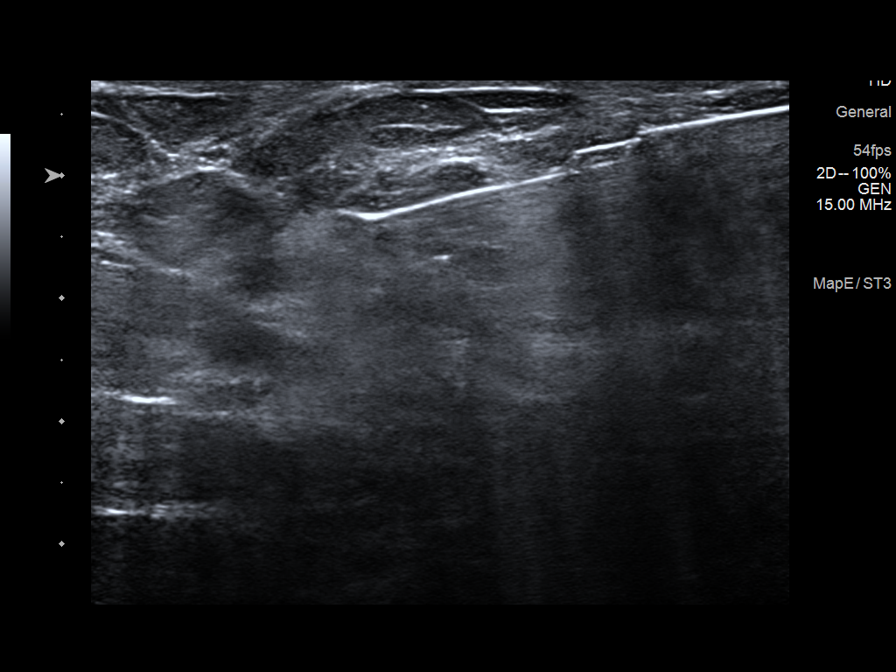
[im 6/9]
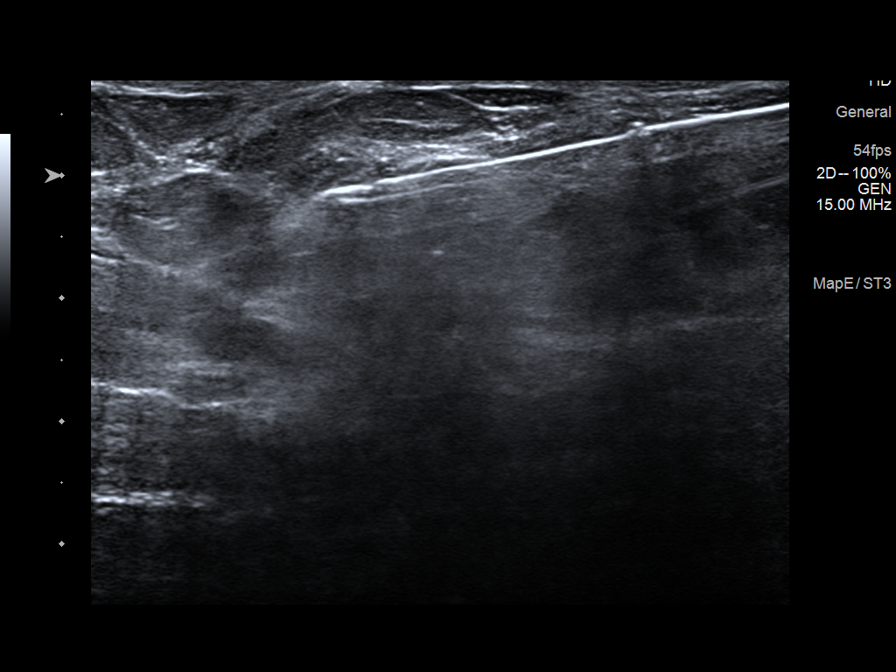
[im 7/9]
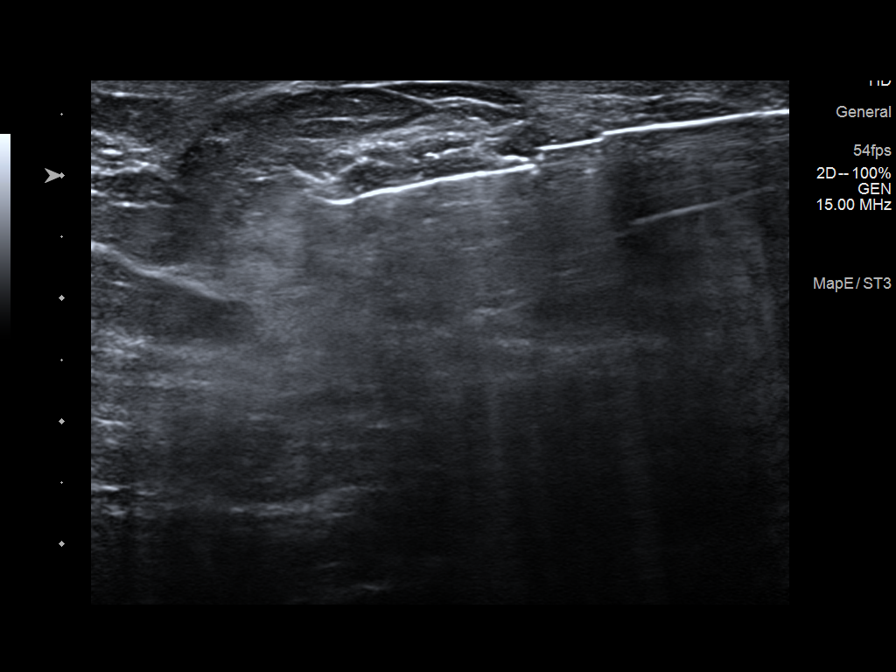
[im 8/9]
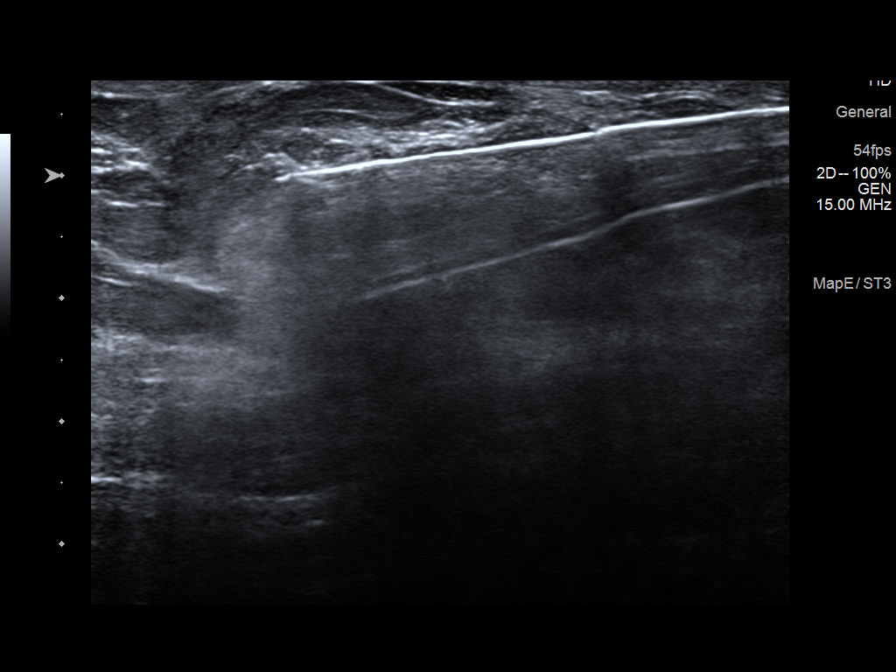
[im 9/9]
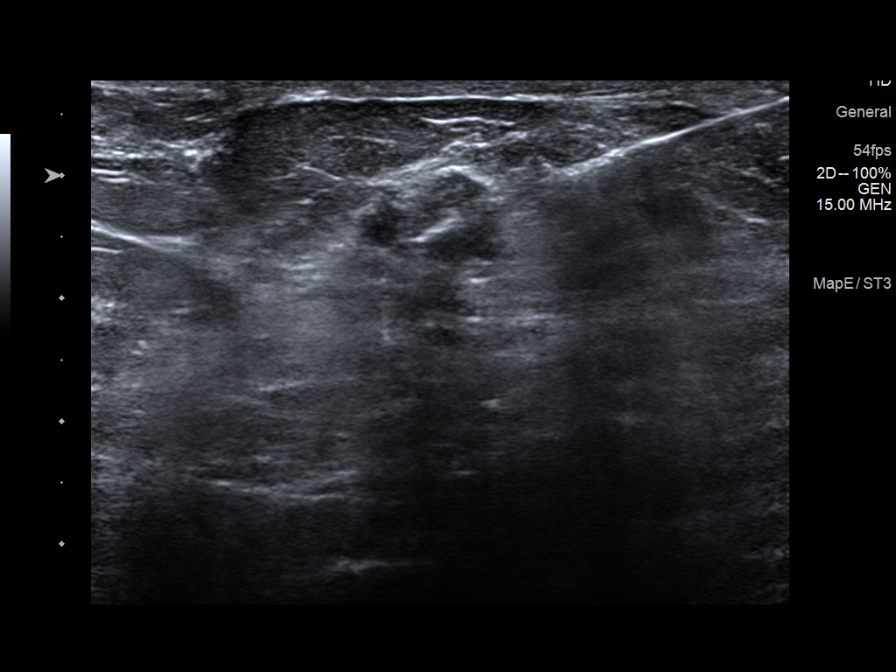

[9 of 9 positions shown; findings below may reference images not displayed]



Lesion quadrant: Upper outer quadrant

Using sterile technique and 1% Lidocaine as local anesthetic, under
direct ultrasound visualization, a 14 gauge Soto device was
used to perform biopsy of a mass in the retroareolar right breast at
10 o'clock using an inferior approach. At the conclusion of the
procedure a ribbon shaped tissue marker clip was deployed into the
biopsy cavity. Follow up 2 view mammogram was performed and dictated
separately.
IMPRESSION: Ultrasound guided biopsy of a right breast mass at 10 o'clock. No
apparent complications.

ADDENDUM:
Pathology revealed FIBROADENOMA of the Right breast, upper outer
quadrant. This was found to be concordant by Dr. Lars Thomas Marjavara.

Pathology results were discussed with the patient by telephone. The
patient reported doing well after the biopsy with tenderness at the
site. Post biopsy instructions and care were reviewed and questions
were answered. The patient was encouraged to call The [REDACTED]

The patient was instructed to return for annual screening

Pathology results reported by Karissa Haupt, RN on 02/23/2018.

*** End of Addendum ***

## 2019-08-16 IMAGING — MG MM CLIP PLACEMENT
2 series · 2 of 2 positions shown · non-contrast
Comparison: Previous exam(s).

CLINICAL DATA: Post biopsy mammogram of the right breast for clip
placement.

EXAM:
DIAGNOSTIC RIGHT MAMMOGRAM POST ULTRASOUND BIOPSY

[R ML]
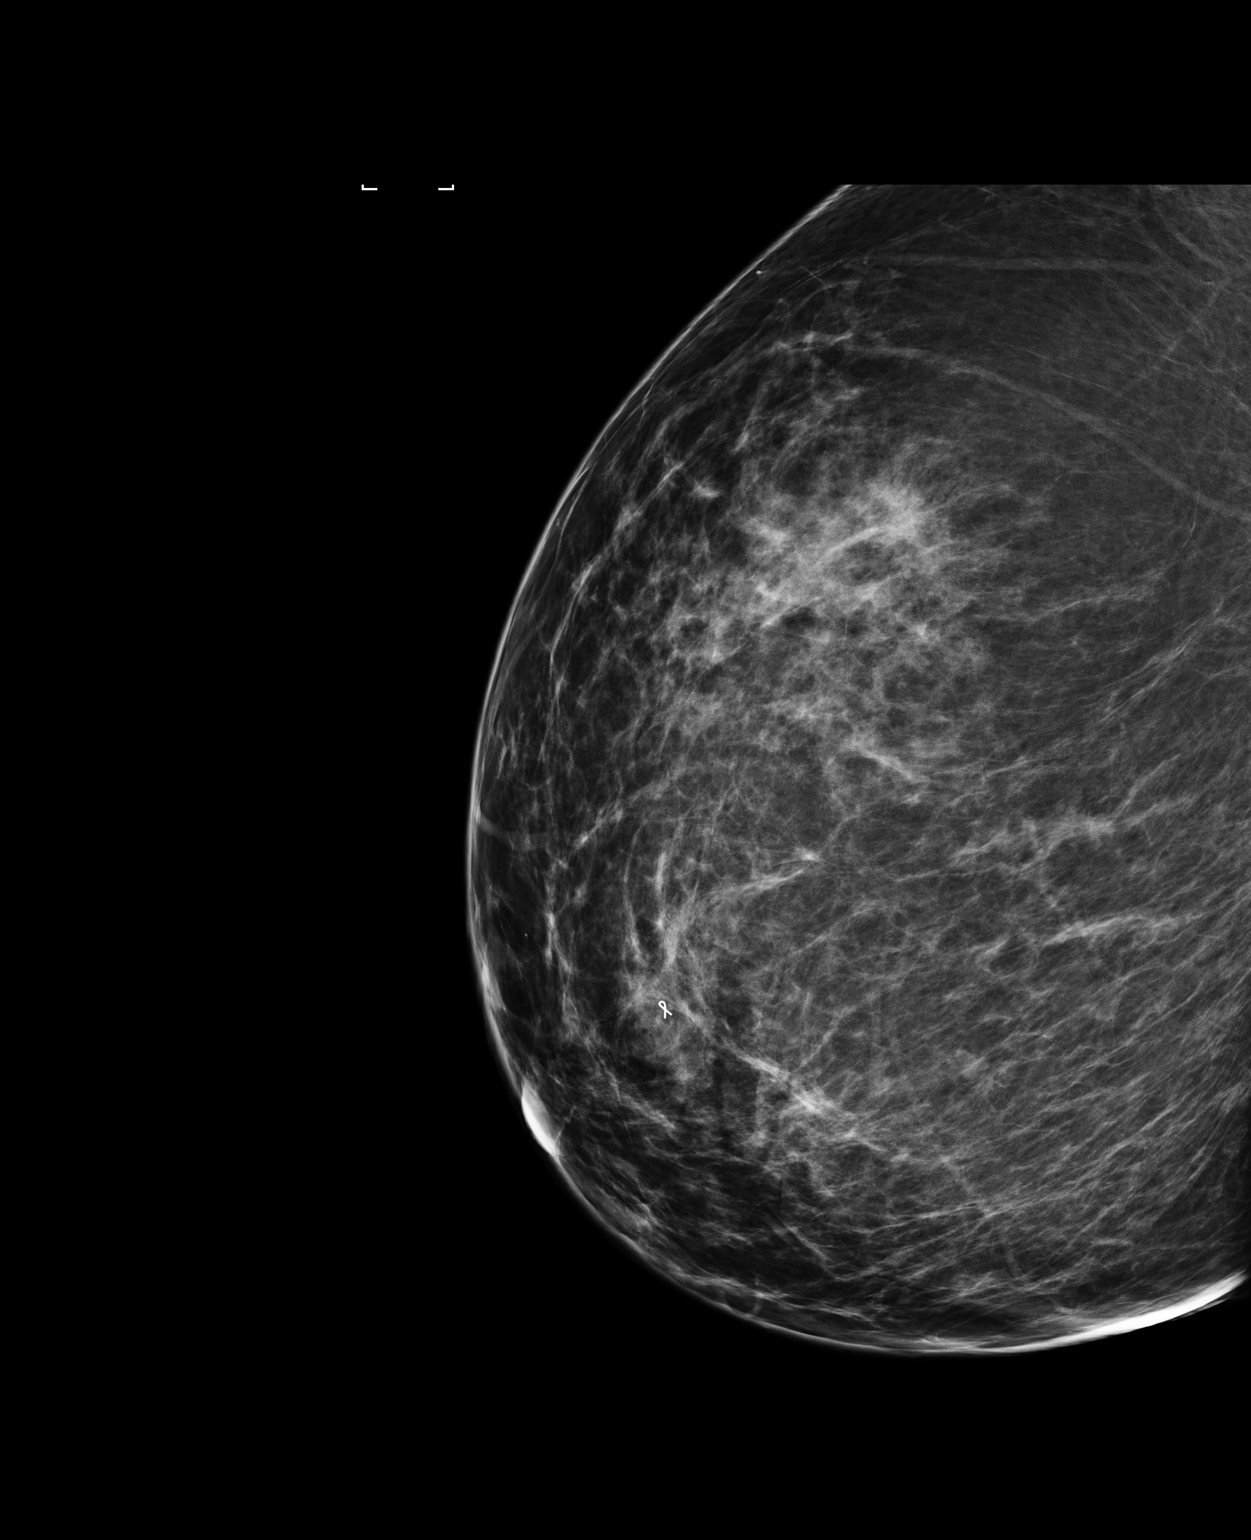

[R CC]
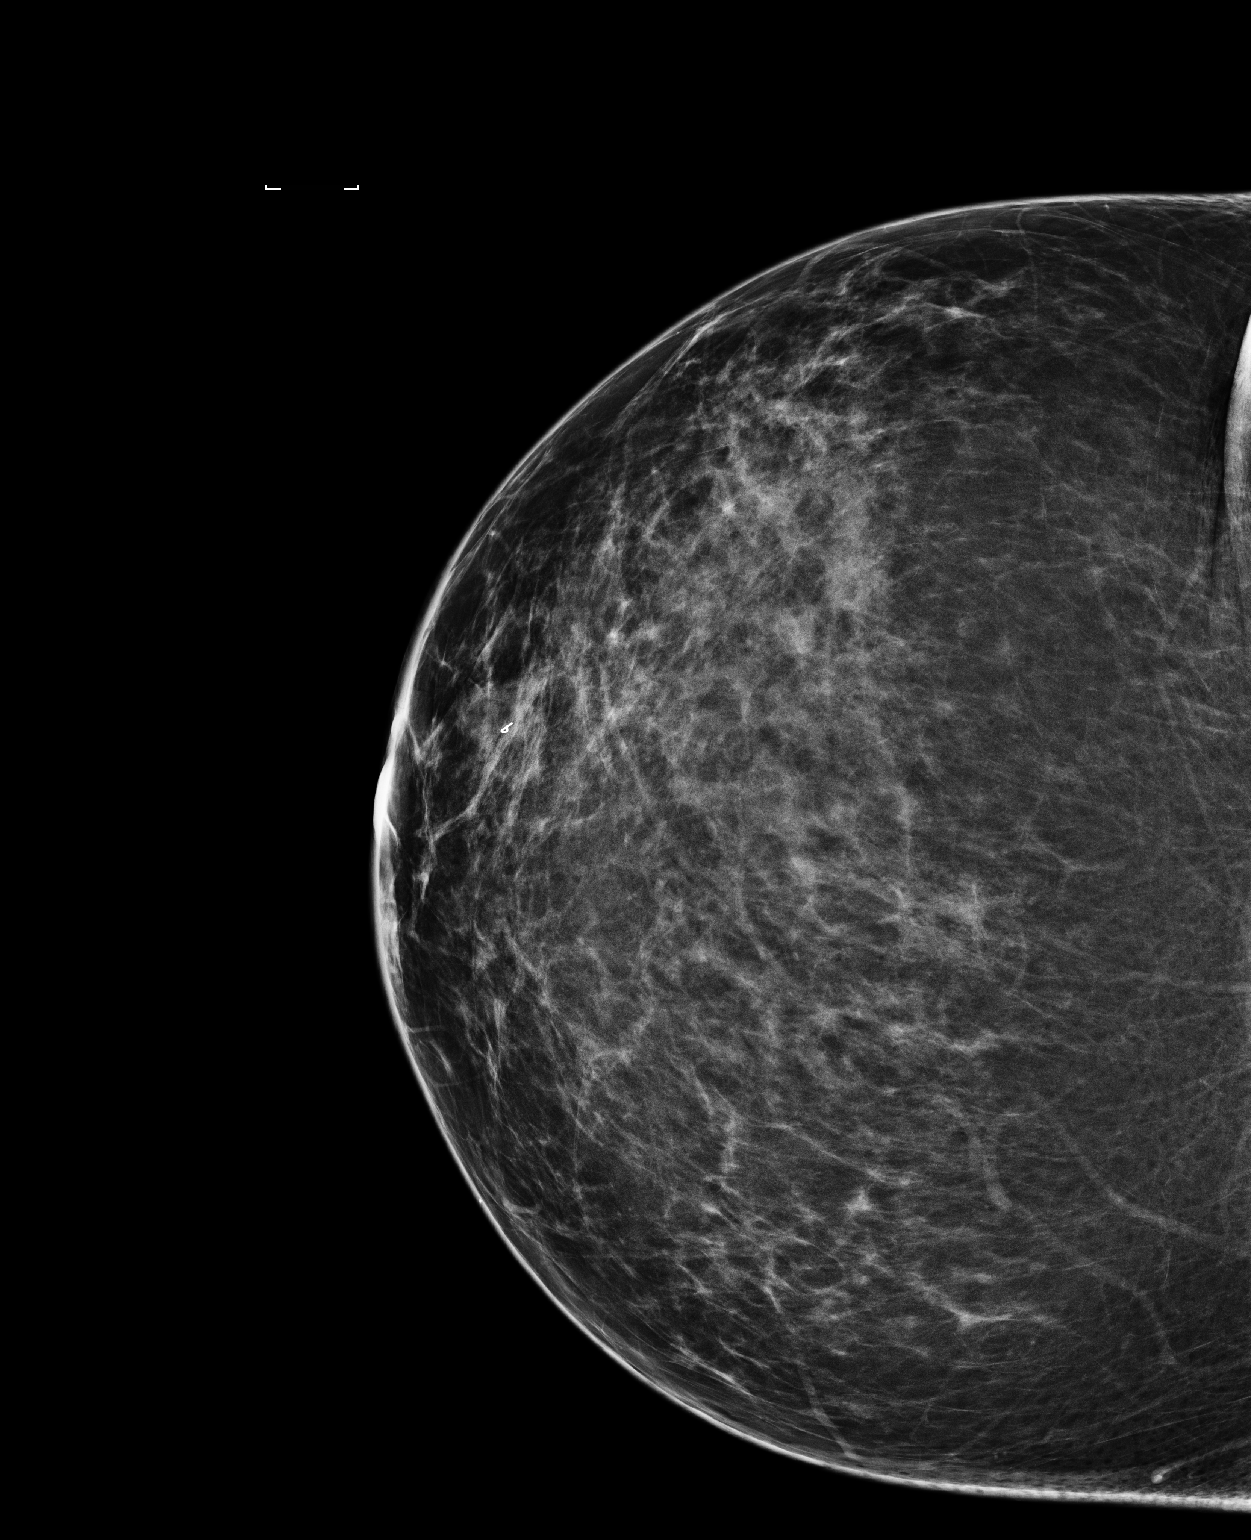

[2 of 2 positions shown; findings below may reference images not displayed]

FINDINGS: Mammographic images were obtained following ultrasound guided biopsy
of a mass in the right breast at 10 o'clock. The ribbon shaped
biopsy marking clip is well positioned at the site of the biopsied
mass at 10 o'clock in the right breast.
IMPRESSION: Appropriate positioning of the ribbon shaped biopsy marking clip in
the retroareolar right breast at 10 o'clock.

Final Assessment: Post Procedure Mammograms for Marker Placement

## 2019-08-25 ENCOUNTER — Telehealth: Payer: Self-pay | Admitting: Family Medicine

## 2019-08-25 DIAGNOSIS — E785 Hyperlipidemia, unspecified: Secondary | ICD-10-CM

## 2019-08-25 NOTE — Telephone Encounter (Signed)
Please call patient and let her know that based on current guidelines we would recommend that she be on a statin which is a cholesterol-lowering drug at bedtime to help reduce her risk for cardiovascular disease since having diabetes puts her at increased risk.  If she is willing to start/try a statin then please let us know so that we can get that sent over to her pharmacy.

## 2019-08-29 MED ORDER — ATORVASTATIN CALCIUM 20 MG PO TABS
20.0000 mg | ORAL_TABLET | Freq: Every day | ORAL | 3 refills | Status: DC
Start: 2019-08-29 — End: 2020-01-30

## 2019-08-29 NOTE — Telephone Encounter (Signed)
Called and advised ok w/ starting statin therapy. rx sent to pharmacy.

## 2019-11-30 LAB — HM DIABETES EYE EXAM

## 2019-12-07 ENCOUNTER — Other Ambulatory Visit: Payer: Self-pay

## 2019-12-07 ENCOUNTER — Encounter: Payer: Self-pay | Admitting: Family Medicine

## 2019-12-07 ENCOUNTER — Ambulatory Visit (INDEPENDENT_AMBULATORY_CARE_PROVIDER_SITE_OTHER): Payer: BC Managed Care – PPO | Admitting: Family Medicine

## 2019-12-07 VITALS — BP 116/76 | HR 94 | Ht 66.0 in | Wt 274.0 lb

## 2019-12-07 DIAGNOSIS — Z23 Encounter for immunization: Secondary | ICD-10-CM

## 2019-12-07 DIAGNOSIS — E119 Type 2 diabetes mellitus without complications: Secondary | ICD-10-CM | POA: Diagnosis not present

## 2019-12-07 DIAGNOSIS — Z1211 Encounter for screening for malignant neoplasm of colon: Secondary | ICD-10-CM

## 2019-12-07 DIAGNOSIS — F3341 Major depressive disorder, recurrent, in partial remission: Secondary | ICD-10-CM | POA: Diagnosis not present

## 2019-12-07 DIAGNOSIS — E785 Hyperlipidemia, unspecified: Secondary | ICD-10-CM

## 2019-12-07 LAB — POCT GLYCOSYLATED HEMOGLOBIN (HGB A1C): Hemoglobin A1C: 5.9 % — AB (ref 4.0–5.6)

## 2019-12-07 NOTE — Assessment & Plan Note (Signed)
Happy with current regimen. Continue Effexor 37.5mg .

## 2019-12-07 NOTE — Assessment & Plan Note (Signed)
Stable.  No change. Doing well on ozempic.  F/U in 4 months. If A1Cis still less than 6, consider decreasing med dose. continue to work on diet and exercise.   Lab Results  Component Value Date   HGBA1C 5.9 (A) 12/07/2019

## 2019-12-07 NOTE — Assessment & Plan Note (Signed)
Tolerating new start of statin. Well.  Due for recheck lipids and CMP.

## 2019-12-07 NOTE — Progress Notes (Signed)
Established Patient Office Visit  Subjective:  Patient ID: Kaitlyn Kirby, female    DOB: 07/06/1970  Age: 49 y.o. MRN: [id number]  CC:  Chief Complaint  Patient presents with  . Diabetes  . mood    HPI [name] presents for   Diabetes - no hypoglycemic events. No wounds or sores that are not healing well. No increased thirst or urination. Checking glucose at home. Taking medications as prescribed without any side effects.  Says she is been trying to work on weight loss is just coming off a little slower than she would like.  F/U depression -she actually is doing well overall she feels that the last couple weeks since school started again has been particularly stressful she says but this is not unusual and usually settles down after about the first month of school she is happy with her current prescription regimen of the venlafaxine and would like to continue it she does not want to make any adjustments today.  Would like to get a colonoscopy as well but is dealing with a fissure that is trying to heal.    Past Medical History:  Diagnosis Date  . Abnormal Pap smear 1995   Colpo  . Allergy   . Asthma    bronchial spasms  . Basal cell carcinoma 2010  . Hemorrhoids   . IBS (irritable bowel syndrome) 8-10 years ago  . Rectal fissure     Past Surgical History:  Procedure Laterality Date  . EYE SURGERY    . HEMORRHOID SURGERY  06/22/2019  . SKIN CANCER EXCISION  2010   chest, basal cell  . WISDOM TOOTH EXTRACTION  49 years old    Family History  Problem Relation Age of Onset  . Hypertension Mother   . Thyroid disease Mother   . Hypertension Father   . Heart attack Father   . Diabetes Brother        type 1     Social History   Socioeconomic History  . Marital status: Single    Spouse name: Not on file  . Number of children: Not on file  . Years of education: Not on file  . Highest education level: Not on file  Occupational History  . Occupation: Pharmacist, hospital   Tobacco Use  . Smoking status: Former Smoker    Packs/day: 0.50    Quit date: 11/10/1995    Years since quitting: 24.0  . Smokeless tobacco: Never Used  Substance and Sexual Activity  . Alcohol use: Yes    Comment: occasional  . Drug use: No  . Sexual activity: Yes    Partners: Male    Birth control/protection: Condom  Other Topics Concern  . Not on file  Social History Narrative  . Not on file   Social Determinants of Health   Financial Resource Strain:   . Difficulty of Paying Living Expenses: Not on file  Food Insecurity:   . Worried About Charity fundraiser in the Last Year: Not on file  . Ran Out of Food in the Last Year: Not on file  Transportation Needs:   . Lack of Transportation (Medical): Not on file  . Lack of Transportation (Non-Medical): Not on file  Physical Activity:   . Days of Exercise per Week: Not on file  . Minutes of Exercise per Session: Not on file  Stress:   . Feeling of Stress : Not on file  Social Connections:   . Frequency of Communication with Friends  and Family: Not on file  . Frequency of Social Gatherings with Friends and Family: Not on file  . Attends Religious Services: Not on file  . Active Member of Clubs or Organizations: Not on file  . Attends Archivist Meetings: Not on file  . Marital Status: Not on file  Intimate Partner Violence:   . Fear of Current or Ex-Partner: Not on file  . Emotionally Abused: Not on file  . Physically Abused: Not on file  . Sexually Abused: Not on file    Outpatient Medications Prior to Visit  Medication Sig Dispense Refill  . diltiazem 2 % GEL USE ONE APPLICATION TO AFFECTED AREA FOUR TIMES DAILY. APPLY ON ANUS FOR 3-6 WEEKS TO ALLOW FISSURE TO HEAL.  3  . albuterol (VENTOLIN HFA) 108 (90 Base) MCG/ACT inhaler Inhale 1 puff into the lungs every 4 (four) hours as needed for wheezing or shortness of breath. 18 g PRN  . atorvastatin (LIPITOR) 20 MG tablet Take 1 tablet (20 mg total) by mouth  at bedtime. 90 tablet 3  . blood glucose meter kit and supplies KIT Dispense based on patient and insurance preference. Use up to four times daily as directed. (FOR ICD-9 250.00, 250.01). 1 each 0  . cetirizine (ZYRTEC) 10 MG tablet Take 10 mg by mouth daily.    . fluticasone (FLONASE) 50 MCG/ACT nasal spray Place into both nostrils daily.    . Semaglutide,0.25 or 0.5MG/DOS, (OZEMPIC, 0.25 OR 0.5 MG/DOSE,) 2 MG/1.5ML SOPN Inject 0.5 mg into the skin once a week. 12 pen 3  . venlafaxine XR (EFFEXOR-XR) 37.5 MG 24 hr capsule TAKE 1 CAPSULE BY MOUTH DAILY WITH BREAKFAST. 90 capsule 1   No facility-administered medications prior to visit.    Allergies  Allergen Reactions  . Amoxicillin Hives  . Celexa [Citalopram Hydrobromide] Other (See Comments)  . Penicillins Hives  . Fluoxetine Palpitations    ROS Review of Systems    Objective:    Physical Exam Constitutional:      Appearance: She is well-developed.  HENT:     Head: Normocephalic and atraumatic.  Cardiovascular:     Rate and Rhythm: Normal rate and regular rhythm.     Heart sounds: Normal heart sounds.  Pulmonary:     Effort: Pulmonary effort is normal.     Breath sounds: Normal breath sounds.  Skin:    General: Skin is warm and dry.  Neurological:     Mental Status: She is alert and oriented to person, place, and time.  Psychiatric:        Behavior: Behavior normal.     There were no vitals taken for this visit. Wt Readings from Last 3 Encounters:  08/02/19 277 lb (125.6 kg)  06/29/19 275 lb (124.7 kg)  04/19/19 277 lb (125.6 kg)     Health Maintenance Due  Topic Date Due  . OPHTHALMOLOGY EXAM  Never done    There are no preventive care reminders to display for this patient.  Lab Results  Component Value Date   TSH 2.80 10/31/2018   Lab Results  Component Value Date   WBC 9.4 11/11/2017   HGB 13.8 11/11/2017   HCT 40.5 11/11/2017   MCV 92.9 11/11/2017   PLT 268 11/11/2017   Lab Results   Component Value Date   NA 139 10/31/2018   K 4.3 10/31/2018   CO2 25 10/31/2018   GLUCOSE 142 (H) 10/31/2018   BUN 8 10/31/2018   CREATININE 0.76 10/31/2018  BILITOT 0.9 10/31/2018   ALKPHOS 44 11/11/2011   AST 18 10/31/2018   ALT 18 10/31/2018   PROT 7.1 10/31/2018   ALBUMIN 4.2 11/11/2011   CALCIUM 10.0 10/31/2018   Lab Results  Component Value Date   CHOL 195 08/12/2017   Lab Results  Component Value Date   HDL 38 (L) 08/12/2017   Lab Results  Component Value Date   LDLCALC 127 (H) 08/12/2017   Lab Results  Component Value Date   TRIG 178 (H) 08/12/2017   Lab Results  Component Value Date   CHOLHDL 5.1 (H) 08/12/2017   Lab Results  Component Value Date   HGBA1C 5.9 (A) 12/07/2019      Assessment & Plan:   Problem List Items Addressed This Visit      Endocrine   Controlled type 2 diabetes mellitus ([place])    Stable.  No change. Doing well on ozempic.  F/U in 4 months. If A1Cis still less than 6, consider decreasing med dose. continue to work on diet and exercise.   Lab Results  Component Value Date   HGBA1C 5.9 (A) 12/07/2019         Relevant Orders   POCT glycosylated hemoglobin (Hb A1C) (Completed)     Other   Hyperlipidemia    Tolerating new start of statin. Well.  Due for recheck lipids and CMP.        Relevant Medications   diltiazem 2 % GEL   Depression - Primary    Happy with current regimen. Continue Effexor 37.84m.       Other Visit Diagnoses    Need for immunization against influenza       Relevant Orders   Flu Vaccine QUAD 36+ mos IM (Completed)   Screening for malignant neoplasm of colon       Relevant Orders   Ambulatory referral to Gastroenterology      No orders of the defined types were placed in this encounter.   Follow-up: Return in about 4 months (around 04/07/2020) for Diabetes follow-up.    CBeatrice Lecher MD

## 2019-12-20 ENCOUNTER — Encounter: Payer: Self-pay | Admitting: Family Medicine

## 2019-12-28 ENCOUNTER — Encounter: Payer: Self-pay | Admitting: Gastroenterology

## 2019-12-30 LAB — COMPLETE METABOLIC PANEL WITH GFR
AG Ratio: 1.6 (calc) (ref 1.0–2.5)
ALT: 12 U/L (ref 6–29)
AST: 11 U/L (ref 10–35)
Albumin: 4 g/dL (ref 3.6–5.1)
Alkaline phosphatase (APISO): 68 U/L (ref 31–125)
BUN: 8 mg/dL (ref 7–25)
CO2: 26 mmol/L (ref 20–32)
Calcium: 9.1 mg/dL (ref 8.6–10.2)
Chloride: 103 mmol/L (ref 98–110)
Creat: 0.86 mg/dL (ref 0.50–1.10)
GFR, Est African American: 92 mL/min/{1.73_m2} (ref >=60)
GFR, Est Non African American: 79 mL/min/{1.73_m2} (ref >=60)
Globulin: 2.5 g/dL (calc) (ref 1.9–3.7)
Glucose, Bld: 125 mg/dL — ABNORMAL HIGH (ref 65–99)
Potassium: 4.4 mmol/L (ref 3.5–5.3)
Sodium: 137 mmol/L (ref 135–146)
Total Bilirubin: 0.5 mg/dL (ref 0.2–1.2)
Total Protein: 6.5 g/dL (ref 6.1–8.1)

## 2019-12-30 LAB — LIPID PANEL
Cholesterol: 192 mg/dL (ref <200)
HDL: 35 mg/dL — ABNORMAL LOW (ref >=50)
LDL Cholesterol (Calc): 129 mg/dL (calc) — ABNORMAL HIGH
Non-HDL Cholesterol (Calc): 157 mg/dL (calc) — ABNORMAL HIGH (ref <130)
Total CHOL/HDL Ratio: 5.5 (calc) — ABNORMAL HIGH (ref <5.0)
Triglycerides: 163 mg/dL — ABNORMAL HIGH (ref <150)

## 2020-01-03 ENCOUNTER — Ambulatory Visit: Payer: Self-pay | Admitting: Surgery

## 2020-01-03 NOTE — H&P (Signed)
Kaitlyn Kirby Appointment: 11/28/2019 11:30 AM Location: [facility] Patient #: [id number] DOB: 01/28/1971 Unknown / Language: English / Race: White Female   History of Present Illness (Burrell Hodapp C. Jenai Scaletta MD; 11/28/2019 12:45 PM) The patient is a 48 year old female who presents with hemorrhoids. Note for "Hemorrhoids": ` ` ` The patient returns s/p internal hemorrhoidal ligation/pexy. Excision of large left anterior and posterior hemorrhoidal piles 06/22/2019      The patient returns to clinic after surgery with concerns. Head and hemorrhoidal ligation hemorrhoidectomy in March. She initially did well. She had episode of rectal bleeding that concerned her in June. She just her bowel regimen. She had to be done in [place] for the past 2 months. Rectal bleeding never really got away. She's noticed some bright red blood in the toilet. Occasionally she wiped. This concerns me offered to see her in the office. Since hopefully was made, she is not having any more bleeding for the past 3 days. However she still has sharp pain with defecation. She's got her stool softener by adding Colace to her usual MiraLAX. Usually moving her bowels in the morning. She recalls being diagnosed with a fissure in the past and wonders if this is what's happened again. She thinks she feels a couple tiny hemorrhoids remaining. They have not necessarily seemed worse.   `  Pathology: Benign fibroepithelial proliferation with prominent vasculature. No malignancy identified. Differential diagnosis includes hemorrhoid and fibroepithelial polyp ` ` `   Problem List/Past Medical (Revella Shelton C. 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 Sharday Michl, MD; 11/28/2019 12:43 PM) No pertinent past surgical history  Breast Biopsy  Right.  Diagnostic Studies History (Dayanis Bergquist C. Avalin Briley, MD; 11/28/2019 12:43 PM) Colonoscopy  never Mammogram  within last year Pap Smear  1-5 years ago  Allergies (Lisa Caldwell, RMA; 11/28/2019 11:38 AM) Amoxicillin *PENICILLINS*  Hives. CeleXA *ANTIDEPRESSANTS*  Allergies Reconciled   Medication History (Lisa Caldwell, RMA; 11/28/2019 11:41 AM) Venlafaxine HCl ER (37.5MG Capsule ER 24HR, Oral) Active. Semaglutide(0.25 or 0.5MG/DOS) (2MG/1.5ML Soln Pen-inj, Subcutaneous) Active. Albuterol (90MCG/ACT Aerosol Soln, Inhalation) Active. ZyrTEC Allergy (10MG Capsule, Oral) Active. Medications Reconciled  Social History (Dimitriy Carreras C. Haleema Vanderheyden, MD; 11/28/2019 12:43 PM) Alcohol use  Occasional alcohol use. Caffeine use  Carbonated beverages, Tea. No drug use  Tobacco use  Former smoker.  Family History (Quinn Bartling C. Brevon Dewald, MD; 11/28/2019 12:43 PM) Diabetes Mellitus  Brother. Heart Disease  Father. Heart disease in female family member before age 55  Hypertension  Brother, Mother. Respiratory Condition  Mother. Thyroid problems  Mother.  Pregnancy / Birth History (Lachell Rochette C. Dalasia Predmore, MD; 11/28/2019 12:43 PM) Age at menarche  9 years, 10 years. Contraceptive History  Oral contraceptives. Gravida  0 Para  0 Regular periods   Other Problems (Brenen Beigel C. Mariza Bourget, MD; 11/28/2019 12:43 PM) Asthma  Back Pain  Cancer  Depression  Diabetes Mellitus  Hemorrhoids   Vitals (Lisa Caldwell RMA; 11/28/2019 11:42 AM) 11/28/2019 11:42 AM Weight: 275.13 lb Height: 66in Body Surface Area: 2.29 m Body Mass Index: 44.41 kg/m  Temp.: 97.9F  Pulse: 95 (Regular)  P.OX: 97% (Room air) BP: 110/78(Sitting, Left Arm, Standard)       Physical Exam (Pranshu Lyster C.    Rajat Staver MD; 11/28/2019 12:03 PM) General Mental Status-Alert. General Appearance-Not in acute distress. Voice-Normal.  Integumentary Global Assessment Upon inspection and palpation of skin surfaces of the - Distribution of scalp and body hair is normal. General Characteristics Overall examination of the patient's skin reveals - no rashes and no suspicious lesions.  Head and Neck Head-normocephalic, atraumatic with no lesions or palpable masses. Face Global Assessment - atraumatic, no absence of expression. Neck Global Assessment - no abnormal movements, no decreased range of motion. Trachea-midline. Thyroid Gland Characteristics - non-tender.  Eye Eyeball - Left-Extraocular movements intact, No Nystagmus - Left. Eyeball - Right-Extraocular movements intact, No Nystagmus - Right. Upper Eyelid - Left-No Cyanotic - Left. Upper Eyelid - Right-No Cyanotic - Right.  Chest and Lung Exam Inspection Accessory muscles - No use of accessory muscles in breathing.  Abdomen Note: Abdomen obese but soft. No guarding or peritonitis   Female Genitourinary Note: No inguinal hernias. No vaginal bleeding or discharge   Rectal Note: Increased sphincter tone with posterior midline fissure.  Perianal skin clear. Small right posterior external hemorrhoidal tag. A few anterior small external hemorrhoid tags as well. Hygiene good. No pruritus. No fistula or abscess. I held off on internal exam.   Peripheral Vascular Upper Extremity Inspection - Left - Not Gangrenous, No Petechiae. Inspection - Right - Not Gangrenous, No Petechiae.  Neurologic Neurologic evaluation reveals -normal attention span and ability to concentrate, able to name objects and repeat phrases. Appropriate fund of knowledge and normal coordination.  Neuropsychiatric Mental status exam performed with findings of-able to articulate well with normal speech/language, rate, volume and  coherence and no evidence of hallucinations, delusions, obsessions or homicidal/suicidal ideation. Orientation-oriented X3.  Musculoskeletal Global Assessment Gait and Station - normal gait and station.  Lymphatic General Lymphatics Description - No Generalized lymphadenopathy.    Assessment & Plan (Julene Rahn C. 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  Discussed with her about diltiazem cream and fiber bowel regimen. She's pressure.  I asked her to call us in about 2 weeks to let us know how things are going. If she is worse with the fissure/symptoms of not resolved by 6 weeks, she should consider partial internal sphincterotomy with outpatient surgery. Trying to hold off. She is optimistic with her improvement bowel regimen and the diltiazem that should heal. Current Plans Pt Education - [facility] Anal Fissure (Jacory Kamel) Started DILTIAZEM GEL, 2% (External Gel), 1 (one) application four times daily, 15 Gram, 11/28/2019, Ref. x3. Local Order: Pharmacist Notes: Apply on anus for 3-6 weeks to allow fissure to heal BRIGHT RED RECTAL BLEEDING (K62.5) Impression: Abdomen bright rectal bleeding most likely due to anal fissure.  I do agree make sense for her get a colonoscopy at some point. However I would hold off until the fissure is healed. She is hoping to get it done in December just in case. IRRITABLE BOWEL SYNDROME WITH CONSTIPATION (K58.1) Impression: I strongly recommend that she adjust her MiraLAX to make her bowels soft her to avoid triggering further hemorrhoid or fissure problems. Current Plans Pt Education - [facility] Good Bowel Health (Daveyon Kitchings) Pt Education - [facility] IBS patient info: discussed with patient and provided information.  EXTERNAL HEMORRHOID, BLEEDING (K64.4) Impression: Recovering status post internal hemorrhoidal ligation and pexy with excision of persistent left anterior and  posterior grade 3/4 piles with external components. Pathology benign.  A few small tiny residual external hemorrhoid tags. However not particularly irritated   or inflamed. She feels reassured. Unguarded against removing since she has at risk for forming recurrent tags  I recommend that she try to keep her bowels and a more normal range to avoid future flares The anatomy & physiology of the anorectal region was discussed. The pathophysiology of hemorrhoids and differential diagnosis was discussed. Natural history progression was discussed. I stressed the importance of a bowel regimen to have daily soft bowel movements to minimize progression of disease. Goal of one BM / day ideal. Use of wet wipes, warm baths, avoiding straining, etc were emphasized.  Educational handouts further explaining the pathology, treatment options, and bowel regimen were given as well. The patient expressed understanding.     Addendum: Patient called 5 weeks later noting that patient has not healed.  I offered surgical evaluation with probable partial internal sphincterotomy.  She is interested in proceeding.  Mirl Hillery C. Jordell Outten, MD, FACS, MASCRS Gastrointestinal and Minimally Invasive Surgery  [facility] [address], [address] [place], [place] [address] [phone] Fax [phone] Main/Paging  CONTACT INFORMATION: Weekday (9AM-5PM) concerns: Call [facility] main office at [phone] Weeknight (5PM-9AM) or Weekend/Holiday concerns: Check [email or web address] for General Surgery [facility] coverage (Please, do not use SecureChat as it is not reliable communication to operating surgeons for immediate patient care)   

## 2020-01-11 ENCOUNTER — Ambulatory Visit: Payer: Self-pay | Admitting: Surgery

## 2020-01-30 ENCOUNTER — Other Ambulatory Visit: Payer: Self-pay

## 2020-01-30 ENCOUNTER — Encounter (HOSPITAL_BASED_OUTPATIENT_CLINIC_OR_DEPARTMENT_OTHER): Payer: Self-pay | Admitting: Surgery

## 2020-01-30 NOTE — Progress Notes (Signed)
Spoke w/ via phone for pre-op interview---pt Lab needs dos---- urine poct              Lab results------has lab appt 02-05-2020 at 900 for cbc, bmet, ekg COVID test ------02-05-2020 1120 Arrive at -------1300 11-10--2021 NPO after MN NO Solid Food.  Clear liquids from MN until---1200 pm drink gatorade g 2 at 1200 pm then npo Medications to take morning of surgery -----bring inhaler, venlafaxine, certrizine Diabetic medication -----none day of surgery Patient Special Instructions -----none Pre-Op special Istructions -----none Patient verbalized understanding of instructions that were given at this phone interview. Patient denies shortness of breath, chest pain, fever, cough at this phone interview.

## 2020-02-05 ENCOUNTER — Other Ambulatory Visit: Payer: Self-pay

## 2020-02-05 ENCOUNTER — Other Ambulatory Visit (HOSPITAL_COMMUNITY)
Admission: RE | Admit: 2020-02-05 | Discharge: 2020-02-05 | Disposition: A | Discharge status: Home / Self Care | Payer: BC Managed Care – PPO | Source: Ambulatory Visit | Attending: Surgery | Admitting: Surgery

## 2020-02-05 ENCOUNTER — Encounter (HOSPITAL_COMMUNITY)
Admission: RE | Admit: 2020-02-05 | Discharge: 2020-02-05 | Disposition: A | Discharge status: Home / Self Care | Payer: BC Managed Care – PPO | Source: Ambulatory Visit | Attending: Surgery | Admitting: Surgery

## 2020-02-05 DIAGNOSIS — Z7984 Long term (current) use of oral hypoglycemic drugs: Secondary | ICD-10-CM | POA: Diagnosis not present

## 2020-02-05 DIAGNOSIS — Z01818 Encounter for other preprocedural examination: Secondary | ICD-10-CM | POA: Insufficient documentation

## 2020-02-05 DIAGNOSIS — K620 Anal polyp: Secondary | ICD-10-CM | POA: Diagnosis not present

## 2020-02-05 DIAGNOSIS — Z20822 Contact with and (suspected) exposure to covid-19: Secondary | ICD-10-CM | POA: Diagnosis not present

## 2020-02-05 DIAGNOSIS — Z79899 Other long term (current) drug therapy: Secondary | ICD-10-CM | POA: Diagnosis not present

## 2020-02-05 DIAGNOSIS — Z87891 Personal history of nicotine dependence: Secondary | ICD-10-CM | POA: Diagnosis not present

## 2020-02-05 DIAGNOSIS — K625 Hemorrhage of anus and rectum: Secondary | ICD-10-CM | POA: Diagnosis not present

## 2020-02-05 DIAGNOSIS — K581 Irritable bowel syndrome with constipation: Secondary | ICD-10-CM | POA: Diagnosis not present

## 2020-02-05 DIAGNOSIS — K644 Residual hemorrhoidal skin tags: Secondary | ICD-10-CM | POA: Diagnosis not present

## 2020-02-05 DIAGNOSIS — E119 Type 2 diabetes mellitus without complications: Secondary | ICD-10-CM | POA: Insufficient documentation

## 2020-02-05 DIAGNOSIS — K602 Anal fissure, unspecified: Secondary | ICD-10-CM | POA: Diagnosis not present

## 2020-02-05 LAB — BASIC METABOLIC PANEL
Anion gap: 6 (ref 5–15)
BUN: 10 mg/dL (ref 6–20)
CO2: 25 mmol/L (ref 22–32)
Calcium: 8.7 mg/dL — ABNORMAL LOW (ref 8.9–10.3)
Chloride: 101 mmol/L (ref 98–111)
Creatinine, Ser: 0.86 mg/dL (ref 0.44–1.00)
GFR, Estimated: >60 mL/min (ref >60)
Glucose, Bld: 110 mg/dL — ABNORMAL HIGH (ref 70–99)
Potassium: 4.1 mmol/L (ref 3.5–5.1)
Sodium: 132 mmol/L — ABNORMAL LOW (ref 135–145)

## 2020-02-05 LAB — CBC
HCT: 40.5 % (ref 36.0–46.0)
Hemoglobin: 13.7 g/dL (ref 12.0–15.0)
MCH: 31.6 pg (ref 26.0–34.0)
MCHC: 33.8 g/dL (ref 30.0–36.0)
MCV: 93.3 fL (ref 80.0–100.0)
Platelets: 262 10*3/uL (ref 150–400)
RBC: 4.34 MIL/uL (ref 3.87–5.11)
RDW: 12.9 % (ref 11.5–15.5)
WBC: 10.2 10*3/uL (ref 4.0–10.5)
nRBC: 0 % (ref 0.0–0.2)

## 2020-02-05 LAB — SARS CORONAVIRUS 2 (TAT 6-24 HRS): SARS Coronavirus 2: NEGATIVE

## 2020-02-07 ENCOUNTER — Ambulatory Visit (HOSPITAL_BASED_OUTPATIENT_CLINIC_OR_DEPARTMENT_OTHER): Payer: BC Managed Care – PPO | Admitting: Anesthesiology

## 2020-02-07 ENCOUNTER — Encounter (HOSPITAL_BASED_OUTPATIENT_CLINIC_OR_DEPARTMENT_OTHER)
Admission: RE | Disposition: A | Discharge status: Home / Self Care | Payer: Self-pay | Source: Home / Self Care | Attending: Surgery

## 2020-02-07 ENCOUNTER — Other Ambulatory Visit: Payer: Self-pay | Admitting: Family Medicine

## 2020-02-07 ENCOUNTER — Other Ambulatory Visit: Payer: Self-pay

## 2020-02-07 ENCOUNTER — Encounter (HOSPITAL_BASED_OUTPATIENT_CLINIC_OR_DEPARTMENT_OTHER): Payer: Self-pay | Admitting: Surgery

## 2020-02-07 ENCOUNTER — Ambulatory Visit (HOSPITAL_BASED_OUTPATIENT_CLINIC_OR_DEPARTMENT_OTHER)
Admission: RE | Admit: 2020-02-07 | Discharge: 2020-02-07 | Disposition: A | Discharge status: Home / Self Care | Payer: BC Managed Care – PPO | Attending: Surgery | Admitting: Surgery

## 2020-02-07 DIAGNOSIS — K625 Hemorrhage of anus and rectum: Secondary | ICD-10-CM | POA: Insufficient documentation

## 2020-02-07 DIAGNOSIS — K644 Residual hemorrhoidal skin tags: Secondary | ICD-10-CM | POA: Insufficient documentation

## 2020-02-07 DIAGNOSIS — Z20822 Contact with and (suspected) exposure to covid-19: Secondary | ICD-10-CM | POA: Insufficient documentation

## 2020-02-07 DIAGNOSIS — Z87891 Personal history of nicotine dependence: Secondary | ICD-10-CM | POA: Insufficient documentation

## 2020-02-07 DIAGNOSIS — K581 Irritable bowel syndrome with constipation: Secondary | ICD-10-CM | POA: Insufficient documentation

## 2020-02-07 DIAGNOSIS — Z7984 Long term (current) use of oral hypoglycemic drugs: Secondary | ICD-10-CM | POA: Insufficient documentation

## 2020-02-07 DIAGNOSIS — F3341 Major depressive disorder, recurrent, in partial remission: Secondary | ICD-10-CM

## 2020-02-07 DIAGNOSIS — Z79899 Other long term (current) drug therapy: Secondary | ICD-10-CM | POA: Insufficient documentation

## 2020-02-07 DIAGNOSIS — K620 Anal polyp: Secondary | ICD-10-CM | POA: Insufficient documentation

## 2020-02-07 DIAGNOSIS — K602 Anal fissure, unspecified: Secondary | ICD-10-CM | POA: Insufficient documentation

## 2020-02-07 HISTORY — DX: Type 2 diabetes mellitus without complications: E11.9

## 2020-02-07 HISTORY — DX: Nausea with vomiting, unspecified: R11.2

## 2020-02-07 HISTORY — DX: Other specified postprocedural states: Z98.890

## 2020-02-07 HISTORY — PX: MINOR SPHINCTEROTOMY: SHX6448

## 2020-02-07 HISTORY — DX: Depression, unspecified: F32.A

## 2020-02-07 LAB — GLUCOSE, CAPILLARY
Glucose-Capillary: 98 mg/dL (ref 70–99)
Glucose-Capillary: 136 mg/dL — ABNORMAL HIGH (ref 70–99)

## 2020-02-07 LAB — POCT PREGNANCY, URINE: Preg Test, Ur: NEGATIVE

## 2020-02-07 SURGERY — MINOR SPHINCTEROTOMY
Anesthesia: General | Site: Rectum

## 2020-02-07 MED ORDER — CLINDAMYCIN PHOSPHATE 900 MG/50ML IV SOLN
900.0000 mg | INTRAVENOUS | Status: AC
  Administered 2020-02-07: 900 mg via INTRAVENOUS

## 2020-02-07 MED ORDER — FENTANYL CITRATE (PF) 100 MCG/2ML IJ SOLN
25.0000 ug | INTRAMUSCULAR | Status: DC | PRN
  Administered 2020-02-07: 50 ug via INTRAVENOUS

## 2020-02-07 MED ORDER — DIAZEPAM 5 MG PO TABS: 5.0000 mg | ORAL_TABLET | Freq: Four times a day (QID) | ORAL | Status: DC | PRN

## 2020-02-07 MED ORDER — PROPOFOL 10 MG/ML IV BOLUS
INTRAVENOUS | Status: AC
  Filled 2020-02-07: qty 20

## 2020-02-07 MED ORDER — AMISULPRIDE (ANTIEMETIC) 5 MG/2ML IV SOLN
INTRAVENOUS | Status: AC
  Filled 2020-02-07: qty 2

## 2020-02-07 MED ORDER — MIDAZOLAM HCL 2 MG/2ML IJ SOLN
INTRAMUSCULAR | Status: AC
  Filled 2020-02-07: qty 2

## 2020-02-07 MED ORDER — OXYCODONE HCL 5 MG PO TABS
5.0000 mg | ORAL_TABLET | Freq: Four times a day (QID) | ORAL | 0 refills | Status: DC | PRN
Start: 2020-02-07 — End: 2020-05-21

## 2020-02-07 MED ORDER — OXYCODONE HCL 5 MG PO TABS: 5.0000 mg | ORAL_TABLET | ORAL | Status: DC | PRN

## 2020-02-07 MED ORDER — ACETAMINOPHEN 500 MG PO TABS
1000.0000 mg | ORAL_TABLET | ORAL | Status: AC
  Administered 2020-02-07: 1000 mg via ORAL

## 2020-02-07 MED ORDER — CLINDAMYCIN PHOSPHATE 900 MG/50ML IV SOLN
INTRAVENOUS | Status: AC
  Filled 2020-02-07: qty 50

## 2020-02-07 MED ORDER — GABAPENTIN 300 MG PO CAPS
ORAL_CAPSULE | ORAL | Status: AC
  Filled 2020-02-07: qty 1

## 2020-02-07 MED ORDER — CELECOXIB 200 MG PO CAPS
ORAL_CAPSULE | ORAL | Status: AC
  Filled 2020-02-07: qty 1

## 2020-02-07 MED ORDER — OXYCODONE HCL 5 MG PO TABS
5.0000 mg | ORAL_TABLET | Freq: Once | ORAL | Status: AC | PRN
  Administered 2020-02-07: 5 mg via ORAL

## 2020-02-07 MED ORDER — CHLORHEXIDINE GLUCONATE CLOTH 2 % EX PADS: 6.0000 | MEDICATED_PAD | Freq: Once | CUTANEOUS | Status: DC

## 2020-02-07 MED ORDER — BUPIVACAINE LIPOSOME 1.3 % IJ SUSP
INTRAMUSCULAR | Status: DC | PRN
  Administered 2020-02-07: 20 mL

## 2020-02-07 MED ORDER — MIDAZOLAM HCL 2 MG/2ML IJ SOLN
INTRAMUSCULAR | Status: DC | PRN
  Administered 2020-02-07: 2 mg via INTRAVENOUS

## 2020-02-07 MED ORDER — LIDOCAINE 2% (20 MG/ML) 5 ML SYRINGE
INTRAMUSCULAR | Status: DC | PRN
  Administered 2020-02-07: 100 mg via INTRAVENOUS

## 2020-02-07 MED ORDER — DEXAMETHASONE SODIUM PHOSPHATE 10 MG/ML IJ SOLN
INTRAMUSCULAR | Status: DC | PRN
  Administered 2020-02-07: 10 mg via INTRAVENOUS

## 2020-02-07 MED ORDER — PROPOFOL 10 MG/ML IV BOLUS
INTRAVENOUS | Status: DC | PRN
  Administered 2020-02-07: 200 mg via INTRAVENOUS

## 2020-02-07 MED ORDER — OXYCODONE HCL 5 MG/5ML PO SOLN: 5.0000 mg | Freq: Once | ORAL | Status: AC | PRN

## 2020-02-07 MED ORDER — AMISULPRIDE (ANTIEMETIC) 5 MG/2ML IV SOLN: 10.0000 mg | Freq: Once | INTRAVENOUS | Status: DC | PRN

## 2020-02-07 MED ORDER — ROCURONIUM BROMIDE 10 MG/ML (PF) SYRINGE
PREFILLED_SYRINGE | INTRAVENOUS | Status: DC | PRN
  Administered 2020-02-07: 60 mg via INTRAVENOUS

## 2020-02-07 MED ORDER — BUPIVACAINE LIPOSOME 1.3 % IJ SUSP: 20.0000 mL | Freq: Once | INTRAMUSCULAR | Status: DC

## 2020-02-07 MED ORDER — ONDANSETRON HCL 4 MG/2ML IJ SOLN: 4.0000 mg | Freq: Once | INTRAMUSCULAR | Status: DC | PRN

## 2020-02-07 MED ORDER — CELECOXIB 200 MG PO CAPS
200.0000 mg | ORAL_CAPSULE | ORAL | Status: AC
  Administered 2020-02-07: 200 mg via ORAL

## 2020-02-07 MED ORDER — ACETAMINOPHEN 500 MG PO TABS
ORAL_TABLET | ORAL | Status: AC
  Filled 2020-02-07: qty 2

## 2020-02-07 MED ORDER — DIAZEPAM 5 MG PO TABS
5.0000 mg | ORAL_TABLET | Freq: Four times a day (QID) | ORAL | 2 refills | Status: DC | PRN
Start: 2020-02-07 — End: 2020-05-21

## 2020-02-07 MED ORDER — GABAPENTIN 300 MG PO CAPS
300.0000 mg | ORAL_CAPSULE | ORAL | Status: AC
  Administered 2020-02-07: 300 mg via ORAL

## 2020-02-07 MED ORDER — DIBUCAINE (PERIANAL) 1 % EX OINT
TOPICAL_OINTMENT | CUTANEOUS | Status: DC | PRN
  Administered 2020-02-07: 1 via RECTAL

## 2020-02-07 MED ORDER — ROCURONIUM BROMIDE 10 MG/ML (PF) SYRINGE
PREFILLED_SYRINGE | INTRAVENOUS | Status: AC
  Filled 2020-02-07: qty 10

## 2020-02-07 MED ORDER — FENTANYL CITRATE (PF) 100 MCG/2ML IJ SOLN
INTRAMUSCULAR | Status: AC
  Filled 2020-02-07: qty 2

## 2020-02-07 MED ORDER — FENTANYL CITRATE (PF) 100 MCG/2ML IJ SOLN
INTRAMUSCULAR | Status: DC | PRN
  Administered 2020-02-07 (×2): 50 ug via INTRAVENOUS

## 2020-02-07 MED ORDER — BUPIVACAINE-EPINEPHRINE (PF) 0.5% -1:200000 IJ SOLN
INTRAMUSCULAR | Status: DC | PRN
  Administered 2020-02-07: 20 mL

## 2020-02-07 MED ORDER — SCOPOLAMINE 1 MG/3DAYS TD PT72
MEDICATED_PATCH | TRANSDERMAL | Status: DC | PRN
  Administered 2020-02-07: 1 via TRANSDERMAL

## 2020-02-07 MED ORDER — OXYCODONE HCL 5 MG PO TABS
ORAL_TABLET | ORAL | Status: AC
  Filled 2020-02-07: qty 1

## 2020-02-07 MED ORDER — GENTAMICIN SULFATE 40 MG/ML IJ SOLN
5.0000 mg/kg | INTRAVENOUS | Status: AC
  Administered 2020-02-07: 420 mg via INTRAVENOUS
  Filled 2020-02-07: qty 10.5

## 2020-02-07 MED ORDER — ONDANSETRON HCL 4 MG/2ML IJ SOLN
INTRAMUSCULAR | Status: AC
  Filled 2020-02-07: qty 2

## 2020-02-07 MED ORDER — DEXAMETHASONE SODIUM PHOSPHATE 10 MG/ML IJ SOLN
INTRAMUSCULAR | Status: AC
  Filled 2020-02-07: qty 1

## 2020-02-07 MED ORDER — SUGAMMADEX SODIUM 200 MG/2ML IV SOLN
INTRAVENOUS | Status: DC | PRN
  Administered 2020-02-07: 400 mg via INTRAVENOUS

## 2020-02-07 MED ORDER — ENSURE PRE-SURGERY PO LIQD
296.0000 mL | Freq: Once | ORAL | Status: AC
  Administered 2020-02-07: 296 mL via ORAL

## 2020-02-07 MED ORDER — LACTATED RINGERS IV SOLN: INTRAVENOUS | Status: DC

## 2020-02-07 MED ORDER — 0.9 % SODIUM CHLORIDE (POUR BTL) OPTIME
TOPICAL | Status: DC | PRN
  Administered 2020-02-07: 500 mL

## 2020-02-07 MED ORDER — LIDOCAINE 2% (20 MG/ML) 5 ML SYRINGE
INTRAMUSCULAR | Status: AC
  Filled 2020-02-07: qty 5

## 2020-02-07 MED ORDER — ONDANSETRON HCL 4 MG/2ML IJ SOLN
INTRAMUSCULAR | Status: DC | PRN
  Administered 2020-02-07: 4 mg via INTRAVENOUS

## 2020-02-07 SURGICAL SUPPLY — 50 items
APL SKNCLS STERI-STRIP NONHPOA (GAUZE/BANDAGES/DRESSINGS) ×1
BENZOIN TINCTURE PRP APPL 2/3 (GAUZE/BANDAGES/DRESSINGS) ×2 IMPLANT
BLADE HEX COATED 2.75 (ELECTRODE) ×2 IMPLANT
BLADE SURG 10 STRL SS (BLADE) IMPLANT
BLADE SURG 15 STRL LF DISP TIS (BLADE) ×1 IMPLANT
BLADE SURG 15 STRL SS (BLADE) ×2
BRIEF STRETCH FOR OB PAD LRG (UNDERPADS AND DIAPERS) ×2 IMPLANT
CANISTER SUCT 1200ML W/VALVE (MISCELLANEOUS) ×2 IMPLANT
COVER BACK TABLE 60X90IN (DRAPES) ×2 IMPLANT
COVER MAYO STAND STRL (DRAPES) ×2 IMPLANT
COVER WAND RF STERILE (DRAPES) ×2 IMPLANT
DECANTER SPIKE VIAL GLASS SM (MISCELLANEOUS) ×2 IMPLANT
DRAPE HYSTEROSCOPY (MISCELLANEOUS) IMPLANT
DRAPE SHEET LG 3/4 BI-LAMINATE (DRAPES) IMPLANT
DRSG PAD ABDOMINAL 8X10 ST (GAUZE/BANDAGES/DRESSINGS) ×2 IMPLANT
ELECT NEEDLE TIP 2.8 STRL (NEEDLE) IMPLANT
ELECT REM PT RETURN 9FT ADLT (ELECTROSURGICAL) ×2
ELECTRODE REM PT RTRN 9FT ADLT (ELECTROSURGICAL) ×1 IMPLANT
GAUZE SPONGE 4X4 12PLY STRL LF (GAUZE/BANDAGES/DRESSINGS) ×2 IMPLANT
GLOVE BIOGEL PI IND STRL 8 (GLOVE) ×1 IMPLANT
GLOVE BIOGEL PI INDICATOR 8 (GLOVE) ×1
GLOVE ECLIPSE 8.0 STRL XLNG CF (GLOVE) ×2 IMPLANT
GOWN STRL REUS W/TWL XL LVL3 (GOWN DISPOSABLE) ×2 IMPLANT
KIT SIGMOIDOSCOPE (SET/KITS/TRAYS/PACK) IMPLANT
KIT TURNOVER CYSTO (KITS) ×2 IMPLANT
LEGGING LITHOTOMY PAIR STRL (DRAPES) IMPLANT
NEEDLE HYPO 22GX1.5 SAFETY (NEEDLE) ×2 IMPLANT
NS IRRIG 500ML POUR BTL (IV SOLUTION) ×2 IMPLANT
PACK BASIN DAY SURGERY FS (CUSTOM PROCEDURE TRAY) ×2 IMPLANT
PAD PREP 24X48 CUFFED NSTRL (MISCELLANEOUS) IMPLANT
PENCIL SMOKE EVACUATOR (MISCELLANEOUS) ×2 IMPLANT
SHEARS HARMONIC 9CM CVD (BLADE) IMPLANT
SURGILUBE 2OZ TUBE FLIPTOP (MISCELLANEOUS) ×2 IMPLANT
SUT CHROMIC 2 0 SH (SUTURE) ×2 IMPLANT
SUT CHROMIC 3 0 SH 27 (SUTURE) IMPLANT
SUT VIC AB 2-0 SH 27 (SUTURE)
SUT VIC AB 2-0 SH 27XBRD (SUTURE) IMPLANT
SUT VIC AB 2-0 UR6 27 (SUTURE) ×12 IMPLANT
SUT VICRYL 0 UR6 27IN ABS (SUTURE) IMPLANT
SUT VICRYL AB 2 0 TIE (SUTURE) IMPLANT
SUT VICRYL AB 2 0 TIES (SUTURE)
SYR 20ML LL LF (SYRINGE) ×2 IMPLANT
SYR BULB IRRIG 60ML STRL (SYRINGE) ×2 IMPLANT
SYR CONTROL 10ML LL (SYRINGE) IMPLANT
TAPE CLOTH 3X10 TAN LF (GAUZE/BANDAGES/DRESSINGS) ×2 IMPLANT
TOWEL OR 17X26 10 PK STRL BLUE (TOWEL DISPOSABLE) ×4 IMPLANT
TRAY DSU PREP LF (CUSTOM PROCEDURE TRAY) ×2 IMPLANT
TUBE CONNECTING 12X1/4 (SUCTIONS) ×2 IMPLANT
UNDERPAD 30X36 HEAVY ABSORB (UNDERPADS AND DIAPERS) ×2 IMPLANT
YANKAUER SUCT BULB TIP NO VENT (SUCTIONS) ×2 IMPLANT

## 2020-02-07 NOTE — H&P (Signed)
Kaitlyn Kirby Appointment: 11/28/2019 11:30 AM Location: [facility] Patient #: [id number] DOB: [birth year] Unknown / Language: Kaitlyn Kirby / Race: White Female   History of Present Illness Kaitlyn Hector MD; 11/28/2019 12:45 PM) The patient is a 49 year old female who presents with hemorrhoids. Note for "Hemorrhoids": ` ` ` The patient returns s/p internal hemorrhoidal ligation/pexy. Excision of large left anterior and posterior hemorrhoidal piles 06/22/2019      The patient returns to clinic after surgery with concerns. Head and hemorrhoidal ligation hemorrhoidectomy in March. She initially did well. She had episode of rectal bleeding that concerned her in June. She just her bowel regimen. She had to be done in [place] for the past 2 months. Rectal bleeding never really got away. She's noticed some bright red blood in the toilet. Occasionally she wiped. This concerns me offered to see her in the office. Since hopefully was made, she is not having any more bleeding for the past 3 days. However she still has sharp pain with defecation. She's got her stool softener by adding Colace to her usual MiraLAX. Usually moving her bowels in the morning. She recalls being diagnosed with a fissure in the past and wonders if this is what's happened again. She thinks she feels a couple tiny hemorrhoids remaining. They have not necessarily seemed worse.   `  Pathology: Benign fibroepithelial proliferation with prominent vasculature. No malignancy identified. 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  Diagnostic Studies History Kaitlyn Hector, MD; 11/28/2019 12:43 PM) Colonoscopy  never Mammogram  within last year Pap Smear  1-5 years ago  Allergies Kaitlyn Kirby, Kirby; 11/28/2019 11:38 AM) Amoxicillin *PENICILLINS*  Hives. CeleXA *ANTIDEPRESSANTS*  Allergies Reconciled   Medication History Kaitlyn Kirby, Kaitlyn Kirby; 11/28/2019 11:41 AM) Venlafaxine HCl ER (37.5MG  Capsule ER 24HR, Oral) Active. Semaglutide(0.25 or 0.5MG /DOS) (2MG /1.5ML Soln Pen-inj, Subcutaneous) Active. Albuterol (90MCG/ACT Aerosol Soln, Inhalation) Active. ZyrTEC Allergy (10MG  Capsule, Oral) Active. Medications Reconciled  Social History Kaitlyn Hector, MD; 11/28/2019 12:43 PM) Alcohol use  Occasional alcohol use. Caffeine use  Carbonated beverages, Tea. No drug use  Tobacco use  Former smoker.  Family History Kaitlyn Hector, MD; 11/28/2019 12:43 PM) Diabetes Mellitus  Brother. Heart Disease  Father. Heart disease in female family member before age 89  Hypertension  Brother, Mother. Respiratory Condition  Mother. Thyroid problems  Mother.  Pregnancy / Birth History Kaitlyn Hector, MD; 11/28/2019 12:43 PM) Age at menarche  44 years, 10 years. Contraceptive History  Oral contraceptives. Gravida  0 Para  0 Regular periods   Other Problems Kaitlyn Hector, MD; 11/28/2019 12:43 PM) Asthma  Back Pain  Cancer  Depression  Diabetes Mellitus  Hemorrhoids   Vitals Kaitlyn Kirby; 11/28/2019 11:42 AM) 11/28/2019 11:42 AM Weight: 275.13 lb Height: 66in Body Surface Area: 2.29 m Body Mass Index: 44.41 kg/m  Temp.: 97.63F  Pulse: 95 (Regular)  P.OX: 97% (Room air) BP: 110/78(Sitting, Left Arm, Standard)       Physical Exam Kaitlyn Kirby C.  Kaitlyn Laning MD; 11/28/2019 12:03 PM) General Mental Status-Alert. General Appearance-Not in acute distress. Voice-Normal.  Integumentary Global Assessment Upon inspection and palpation of skin surfaces of the - Distribution of scalp and body hair is normal. General Characteristics Overall examination of the patient's skin reveals - no rashes and no suspicious lesions.  Head and Neck Head-normocephalic, atraumatic with no lesions or palpable masses. Face Global Assessment - atraumatic, no absence of expression. Neck Global Assessment - no abnormal movements, no decreased range of motion. Trachea-midline. Thyroid Gland Characteristics - non-tender.  Eye Eyeball - Left-Extraocular movements intact, No Nystagmus - Left. Eyeball - Right-Extraocular movements intact, No Nystagmus - Right. Upper Eyelid - Left-No Cyanotic - Left. Upper Eyelid - Right-No Cyanotic - Right.  Chest and Lung Exam Inspection Accessory muscles - No use of accessory muscles in breathing.  Abdomen Note: Abdomen obese but soft. No guarding or peritonitis   Female Genitourinary Note: No inguinal hernias. No vaginal bleeding or discharge   Rectal Note: Increased sphincter tone with posterior midline fissure.  Perianal skin clear. Small right posterior external hemorrhoidal tag. A few anterior small external hemorrhoid tags as well. Hygiene good. No pruritus. No fistula or abscess. I held off on internal exam.   Peripheral Vascular Upper Extremity Inspection - Left - Not Gangrenous, No Petechiae. Inspection - Right - Not Gangrenous, No Petechiae.  Neurologic Neurologic evaluation reveals -normal attention span and ability to concentrate, able to name objects and repeat phrases. Appropriate fund of knowledge and normal coordination.  Neuropsychiatric Mental status exam performed with findings of-able to articulate well with normal speech/language, rate, volume and  coherence and no evidence of hallucinations, delusions, obsessions or homicidal/suicidal ideation. Orientation-oriented X3.  Musculoskeletal Global Assessment Gait and Station - normal gait and station.  Lymphatic General Lymphatics Description - No Generalized lymphadenopathy.    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  Discussed with her about diltiazem cream and fiber bowel regimen. She's pressure.  I asked her to call us in about 2 weeks to let us know how things are going. If she is worse with the fissure/symptoms of not resolved by 6 weeks, she should consider partial internal sphincterotomy with outpatient surgery. Trying to hold off. She is optimistic with her improvement bowel regimen and the diltiazem that should heal. Current Plans Pt Education - [facility] Anal Fissure (Kaitlyn Kirby) Started DILTIAZEM GEL, 2% (External Gel), 1 (one) application four times daily, 15 Gram, 11/28/2019, Ref. x3. Local Order: Pharmacist Notes: Apply on anus for 3-6 weeks to allow fissure to heal BRIGHT RED RECTAL BLEEDING (K62.5) Impression: Abdomen bright rectal bleeding most likely due to anal fissure.  I do agree make sense for her get a colonoscopy at some point. However I would hold off until the fissure is healed. She is hoping to get it done in December just in case. IRRITABLE BOWEL SYNDROME WITH CONSTIPATION (K58.1) Impression: I strongly recommend that she adjust her MiraLAX to make her bowels soft her to avoid triggering further hemorrhoid or fissure problems. Current Plans Pt Education - [facility] Good Bowel Health (Kaitlyn Kirby) Pt Education - [facility] IBS patient info: discussed with patient and provided information.  EXTERNAL HEMORRHOID, BLEEDING (K64.4) Impression: Recovering status post internal hemorrhoidal ligation and pexy with excision of persistent left anterior and  posterior grade 3/4 piles with external components. Pathology benign.  A few small tiny residual external hemorrhoid tags. However not particularly irritated  or inflamed. She feels reassured. Unguarded against removing since she has at risk for forming recurrent tags  I recommend that she try to keep her bowels and a more normal range to avoid future flares The anatomy & physiology of the anorectal region was discussed. The pathophysiology of hemorrhoids and differential diagnosis was discussed. Natural history progression was discussed. I stressed the importance of a bowel regimen to have daily soft bowel movements to minimize progression of disease. Goal of one BM / day ideal. Use of wet wipes, warm baths, avoiding straining, etc were emphasized.  Educational handouts further explaining the pathology, treatment options, and bowel regimen were given as well. The patient expressed understanding.     Addendum: Patient called 5 weeks later noting that patient has not healed.  I offered surgical evaluation with probable partial internal sphincterotomy.  She is interested in proceeding.  Kaitlyn Hector, MD, FACS, MASCRS Gastrointestinal and Minimally Invasive Surgery  [facility] [address] [place], [place] [address] [phone] Fax [phone] Main/Paging  CONTACT INFORMATION: Weekday (9AM-5PM) concerns: Call [facility] main office at [phone] Weeknight (5PM-9AM) or Weekend/Holiday concerns: Check [email or web address] for General Surgery [facility] coverage (Please, do not use SecureChat as it is not reliable communication to operating surgeons for immediate patient care)

## 2020-02-07 NOTE — Op Note (Addendum)
02/07/2020  2:24 PM  PATIENT:  Kaitlyn Kirby  49 y.o. female  Patient Care Team: Hali Marry, MD as PCP - Huston Foley, MD as Consulting Physician (General Surgery) Guss Bunde, MD as Consulting Physician (Obstetrics and Gynecology)  PRE-OPERATIVE DIAGNOSIS:  ANAL FISSURE REFRACTORY TO MEDICAL MANAGEMENT  POST-OPERATIVE DIAGNOSIS:   ANAL FISSURE REFRACTORY TO MEDICAL MANAGEMENT ANAL CRYPT POLYPS X 2 EXTERNAL HEMORRHOID   PROCEDURE:   ANAL SPHINCTEROTOMY EXCISION OF ANAL POLYPS X 2 EXTERNAL HEMORRHOIDECTOMY HEMORRHOIDAL LIGATION/PEXY ANORECTAL EXAMINATION UNDER ANESTHESIA,   SURGEON:  [name], MD  ASSISTANT: OR Staff   ANESTHESIA:   General Anorectal & Local field block (0.25% bupivacaine with epinephrine mixed with Liposomal bupivacaine (Experel)   EBL:  Total I/O In: 760.5 [I.V.:600; IV Piggyback:160.5] Out: 71 [Blood:50]  Delay start of Pharmacological VTE agent (>24hrs) due to surgical blood loss or risk of bleeding:  no  DRAINS: NONE  SPECIMEN:   ANAL CRYPT POLYPS X 2 (laeft lateral & left posterior) EXTERNAL HEMORRHOID TAG (right posterior)  DISPOSITION OF SPECIMEN:  PATHOLOGY  COUNTS:  YES  PLAN OF CARE: Discharge to home after PACU  PATIENT DISPOSITION:  PACU - hemodynamically stable.  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  I recommended examination and surgical treatment:  The anatomy & physiology of the anorectal region was discussed.  The pathophysiology of anal fissure and differential diagnosis was discussed.  Natural history progression  was discussed.   I stressed the importance of a bowel regimen to have daily soft bowel movements to minimize progression of disease.     The patient's condition is not adequately controlled.  Non-operative treatment has not healed the fissure.  Therefore, I recommended examination under anesthesia for better  examination to confirm the diagnosis and treat by lateral internal sphincterotomy to relax the spasm better & allow the fissure to heal.  Technique, benefits, alternatives were discussed.   I noted a good likelihood this will help address the problem.  Risks such as bleeding, pain, incontinence, recurrence, heart attack, death, and other risks were discussed.    Educational handouts further explaining the pathology, treatment options, and bowel regimen were given as well.  The patient expressed understanding & wishes to proceed with surgery.  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   Sphincterotomy location:   Left lateral anal canal.  70% distal internal sphincterotomy performed  Enlarged left sided anal crypt polyps, prolapsing.  Excised.  Persistent right posterior external hemorrhoidal tag.  Excised.  Grade 2 internal hemorrhoids.  Hemorrhoid ligation/pexy done at all 6 classic hemorrhoidal columns.  No aggressive internal hemorrhoidectomy is done given prior hemorrhoidal surgery.  No fistula.  No tumor or abscess.  No anal stricture.  No strong evidence of proctitis.  No pilonidal disease.  No condyloma.  DESCRIPTION:   Informed consent was confirmed. Patient underwent general anesthesia without difficulty. Patient was placed into  prone positioning.  The perianal region was prepped and draped in sterile fashion. Surgical timeout confirmed or plan.  I did digital rectal examination and then transitioned over to anoscopy to get a sense of the anatomy.  I identified an anal fissure in the posterior midline anal canal.  The sphincter tone was increased.  No stricture.  No abscess located.  No fistula   I proceeded to do hemorrhoidal ligation and pexy.  I used a 2-0 Vicryl suture on a UR-6 needle  in a figure-of-eight fashion 6 cm proximal to the anal verge.  I started at the left lateral sphincterotomy location.  I then ran that  stitch longitudinally more distally.   I went ahead and proceeded with internal sphinterotmy technique.  I excise through the anoderm of the left lateral anal canal longitudinally.  I identified the internal and external sphincters.  Elevating the internal sphincter -was thickened and enlarged.  I proceeded with a partial internal sphincterotomy starting distally and moving proximally using cautery.  This involved the distal 70% thickness.  This provided improved relaxation of the anal sphincter.  I closed the sphincterotomy wound to the anal verge over a large Parks self retaining retractor to avoid narrowing of the anal canal.  I then tied that stitch down to cause a hemorrhoidopexy.   There was a left lateral anal crypt polyp at the site of the left lateral sphincterotomy.  I excised this longitudinally and that stitch to help close the site of excision.  I then did hemorrhoidal ligation and pexy at the other 5 hemorrhoidal columns.  I had to excise a left posterior prolapsing crypt polyp as well.  At the completion of this, all 6 anorectal columns were ligated and pexied in the classic hexagonal fashion (right anterior/lateral/posterior, left anterior/lateral/posterior).  There was a persistent right posterior external hemorrhoid that I excised since it was pedunculated and inflamed.  I closed the external part of the hemorrhoidectomy wound with the 2-0 Vicryl that was used as a hemorrhoidal ligation/pexy at the right posterior hemorrhoidal column.    I redid anoscopy & examination.  At completion of this, all hemorrhoids had been removed or reduced into the rectum.  There is no more prolapse.  Internal & external anatomy was more more normal.  A few small remaining external hemorrhoidal folds but nothing major that warranted excision.  Do not want to be overly aggressive since this is her second anorectal surgery.  Hemostasis was good.  Fluffed gauze was on-laid over the perianal region.  No packing done.   Patient is being extubated go to go to the recovery room..  I reexamined the anal canal.   There is was no narrowing.  Hemostasis was excellent.  I repeated anoscopy and examination.  Hemostasis was good.  Patient is being extubated go to recovery room.  I discussed operative findings, updated the patient's status, discussed probable steps to recovery, and gave postoperative recommendations to the patient's mother, Kaitlyn Kirby.  Recommendations were made.  Questions were answered.  She expressed understanding & appreciation.   [name], M.D., F.A.C.S. Gastrointestinal and Minimally Invasive Surgery [facility] [address], [place], [place] [address] [phone] Main / Paging

## 2020-02-07 NOTE — Progress Notes (Signed)
Patient drank pre surgical drink at 10.30

## 2020-02-07 NOTE — Interval H&P Note (Signed)
History and Physical Interval Note:  02/07/2020 1:01 PM  Kaitlyn Kirby  has presented today for surgery, with the diagnosis of [place].  The various methods of treatment have been discussed with the patient and family. After consideration of risks, benefits and other options for treatment, the patient has consented to  Procedure(s): ANAL SPHINCTEROTOMY. POSSIBLE HEMORRHOIDECTOMY.ANORECTAL EXAMINATION UNDER ANESTHESIA (N/A) as a surgical intervention.  The patient's history has been reviewed, patient examined, no change in status, stable for surgery.  I have reviewed the patient's chart and labs.  Questions were answered to the patient's satisfaction.    I have re-reviewed the the patient's records, history, medications, and allergies.  I have re-examined the patient.  I again discussed intraoperative plans and goals of post-operative recovery.  The patient agrees to proceed.  Kaitlyn Kirby  1970-11-07 [id number]  Patient Care Team: Hali Marry, MD as PCP - Huston Foley, MD as Consulting Physician (General Surgery) Guss Bunde, MD as Consulting Physician (Obstetrics and Gynecology)  Patient Active Problem List   Diagnosis Date Noted   Hyperlipidemia 08/02/2019   Controlled type 2 diabetes mellitus ([place]) 11/16/2018   Elevated BP without diagnosis of hypertension 11/16/2018   Secondary oligomenorrhea 11/11/2018   Dyspareunia in female 02/04/2018   Internal hemorrhoid, bleeding 12/23/2017   Severe obesity (BMI >= 40) ([place]) 19/62/2297   Lichen simplex chronicus 02/03/2012   Fissure in ano 01/02/2011   Allergic rhinitis due to allergen 12/28/2008   LUMBAGO 04/05/2006   Depression 02/04/2006   Hemorrhoids, external 02/04/2006   Irritable bowel syndrome with constipation 02/04/2006    Past Medical History:  Diagnosis Date   Abnormal Pap smear 1995   Colpo   Allergy    Asthma    seasonal   Basal cell carcinoma 2010    Depression    DM type 2 (diabetes mellitus, type 2) (HCC)    Hemorrhoids    IBS (irritable bowel syndrome) 8-10 years ago   PONV (postoperative nausea and vomiting)    Rectal fissure     Past Surgical History:  Procedure Laterality Date   EYE SURGERY Right 2017   clogged tear duct   HEMORRHOID SURGERY  06/22/2019   SKIN CANCER EXCISION  2010   chest, basal cell   WISDOM TOOTH EXTRACTION  49 years old    Social History   Socioeconomic History   Marital status: Single    Spouse name: Not on file   Number of children: Not on file   Years of education: Not on file   Highest education level: Not on file  Occupational History   Occupation: teacher  Tobacco Use   Smoking status: Former Smoker    Packs/day: 0.50    Quit date: 11/10/1995    Years since quitting: 24.2   Smokeless tobacco: Never Used   Tobacco comment: social teeange yrs  Vaping Use   Vaping Use: Never used  Substance and Sexual Activity   Alcohol use: Yes    Comment: occasional   Drug use: No   Sexual activity: Yes    Partners: Male    Birth control/protection: Condom  Other Topics Concern   Not on file  Social History Narrative   Not on file   Social Determinants of Health   Financial Resource Strain:    Difficulty of Paying Living Expenses: Not on file  Food Insecurity:    Worried About Running Out of Food in the Last Year: Not on file  Ran Out of Food in the Last Year: Not on file  Transportation Needs:    Lack of Transportation (Medical): Not on file   Lack of Transportation (Non-Medical): Not on file  Physical Activity:    Days of Exercise per Week: Not on file   Minutes of Exercise per Session: Not on file  Stress:    Feeling of Stress : Not on file  Social Connections:    Frequency of Communication with Friends and Family: Not on file   Frequency of Social Gatherings with Friends and Family: Not on file   Attends Religious Services: Not on file   Active Member of [place] or  Organizations: Not on file   Attends Archivist Meetings: Not on file   Marital Status: Not on file  Intimate Partner Violence:    Fear of Current or Ex-Partner: Not on file   Emotionally Abused: Not on file   Physically Abused: Not on file   Sexually Abused: Not on file    Family History  Problem Relation Age of Onset   Hypertension Mother    Thyroid disease Mother    Hypertension Father    Heart attack Father    Diabetes Brother        type 1     Medications Prior to Admission  Medication Sig Dispense Refill Last Dose   albuterol (VENTOLIN HFA) 108 (90 Base) MCG/ACT inhaler Inhale 1 puff into the lungs every 4 (four) hours as needed for wheezing or shortness of breath. 18 g PRN 02/06/2020 at Unknown time   blood glucose meter kit and supplies KIT Dispense based on patient and insurance preference. Use up to four times daily as directed. (FOR ICD-9 250.00, 250.01). 1 each 0 Past Week at Unknown time   cetirizine (ZYRTEC) 10 MG tablet Take 10 mg by mouth daily.   02/07/2020 at 0730   diltiazem 2 % GEL USE ONE APPLICATION TO AFFECTED AREA FOUR TIMES DAILY. APPLY ON ANUS FOR 3-6 WEEKS TO ALLOW FISSURE TO HEAL.  3 Past Month at Unknown time   Semaglutide,0.25 or 0.5MG/DOS, (OZEMPIC, 0.25 OR 0.5 MG/DOSE,) 2 MG/1.5ML SOPN Inject 0.5 mg into the skin once a week. (Patient taking differently: Inject 0.5 mg into the skin once a week. saturday) 12 pen 3 Past Week at Unknown time   venlafaxine XR (EFFEXOR-XR) 37.5 MG 24 hr capsule TAKE 1 CAPSULE BY MOUTH DAILY WITH BREAKFAST. 90 capsule 1 02/07/2020 at 0730   fluticasone (FLONASE) 50 MCG/ACT nasal spray Place into both nostrils as needed.    More than a month at Unknown time    Current Facility-Administered Medications  Medication Dose Route Frequency Provider Last Rate Last Admin   bupivacaine liposome (EXPAREL) 1.3 % injection 266 mg  20 mL Infiltration Once Michael Boston, MD       Chlorhexidine Gluconate Cloth 2 % PADS 6 each  6  each Topical Once Michael Boston, MD       And   Chlorhexidine Gluconate Cloth 2 % PADS 6 each  6 each Topical Once Michael Boston, MD       clindamycin (CLEOCIN) IVPB 900 mg  900 mg Intravenous On Call to OR Michael Boston, MD       And   gentamicin (GARAMYCIN) 420 mg in dextrose 5 % 100 mL IVPB  5 mg/kg (Adjusted) Intravenous On Call to OR Michael Boston, MD       lactated ringers infusion   Intravenous Continuous Barnet Glasgow, MD 50 mL/hr  at 02/07/20 1225 New Bag at 02/07/20 1225     Allergies  Allergen Reactions   Amoxicillin Hives   Celexa [Citalopram Hydrobromide] Other (See Comments)    Fuzzy headed   Penicillins Hives   Fluoxetine Palpitations    BP (!) 133/94   Pulse 85   Temp 97.6 F (36.4 C) (Oral)   Resp 18   Ht $R'5\' 6"'jm$  (1.676 m)   Wt 124.8 kg   LMP 01/16/2020   SpO2 98%   BMI 44.42 kg/m   Labs: Results for orders placed or performed during the hospital encounter of 02/07/20 (from the past 48 hour(s))  Pregnancy, urine POC     Status: None   Collection Time: 02/07/20 11:39 AM  Result Value Ref Range   Preg Test, Ur NEGATIVE NEGATIVE    Comment:        THE SENSITIVITY OF THIS METHODOLOGY IS >24 mIU/mL   Glucose, capillary     Status: None   Collection Time: 02/07/20 11:58 AM  Result Value Ref Range   Glucose-Capillary 98 70 - 99 mg/dL    Comment: Glucose reference range applies only to samples taken after fasting for at least 8 hours.    Imaging / Studies: No results found.   [name], M.D., F.A.C.S. Gastrointestinal and Minimally Invasive Surgery [facility] [address], [place] [address], [place] [address] [phone] Main / Paging  02/07/2020 1:02 PM    [name]

## 2020-02-07 NOTE — Anesthesia Preprocedure Evaluation (Addendum)
Anesthesia Evaluation  Patient identified by MRN, date of birth, ID band Patient awake    Reviewed: Allergy & Precautions, NPO status , Patient's Chart, lab work & pertinent test results  History of Anesthesia Complications (+) PONVNegative for: history of anesthetic complications  Airway Mallampati: II  TM Distance: >3 FB Neck ROM: Full    Dental  (+) Teeth Intact   Pulmonary asthma , former smoker,    Pulmonary exam normal        Cardiovascular negative cardio ROS Normal cardiovascular exam     Neuro/Psych Depression negative neurological ROS     GI/Hepatic negative GI ROS, Neg liver ROS,   Endo/Other  diabetes, Type 2Morbid obesity  Renal/GU negative Renal ROS  negative genitourinary   Musculoskeletal negative musculoskeletal ROS (+)   Abdominal   Peds  Hematology negative hematology ROS (+)   Anesthesia Other Findings   Reproductive/Obstetrics negative OB ROS                            Anesthesia Physical Anesthesia Plan  ASA: III  Anesthesia Plan: General   Post-op Pain Management:    Induction: Intravenous  PONV Risk Score and Plan: 4 or greater and Ondansetron, Dexamethasone, Treatment may vary due to age or medical condition, Midazolam and Scopolamine patch - Pre-op  Airway Management Planned: Oral ETT  Additional Equipment: None  Intra-op Plan:   Post-operative Plan: Extubation in OR  Informed Consent: I have reviewed the patients History and Physical, chart, labs and discussed the procedure including the risks, benefits and alternatives for the proposed anesthesia with the patient or authorized representative who has indicated his/her understanding and acceptance.     Dental advisory given  Plan Discussed with:   Anesthesia Plan Comments:        Anesthesia Quick Evaluation

## 2020-02-07 NOTE — Interval H&P Note (Signed)
History and Physical Interval Note:  02/07/2020 1:06 PM  Kaitlyn Kirby  has presented today for surgery, with the diagnosis of [place].  The various methods of treatment have been discussed with the patient and family. After consideration of risks, benefits and other options for treatment, the patient has consented to  Procedure(s): ANAL SPHINCTEROTOMY. POSSIBLE HEMORRHOIDECTOMY.ANORECTAL EXAMINATION UNDER ANESTHESIA (N/A) as a surgical intervention.  The patient's history has been reviewed, patient examined, no change in status, stable for surgery.  I have reviewed the patient's chart and labs.  Questions were answered to the patient's satisfaction.    I have re-reviewed the the patient's records, history, medications, and allergies.  I have re-examined the patient.  I again discussed intraoperative plans and goals of post-operative recovery.  The patient agrees to proceed.  Kaitlyn Kirby  [birth year] [id number]  Patient Care Team: Hali Marry, MD as PCP - Huston Foley, MD as Consulting Physician (General Surgery) Guss Bunde, MD as Consulting Physician (Obstetrics and Gynecology)  Patient Active Problem List   Diagnosis Date Noted   Hyperlipidemia 08/02/2019   Controlled type 2 diabetes mellitus ([place]) 11/16/2018   Elevated BP without diagnosis of hypertension 11/16/2018   Secondary oligomenorrhea 11/11/2018   Dyspareunia in female 02/04/2018   Internal hemorrhoid, bleeding 12/23/2017   Severe obesity (BMI >= 40) ([place]) 93/57/0177   Lichen simplex chronicus 02/03/2012   Fissure in ano 01/02/2011   Allergic rhinitis due to allergen 12/28/2008   LUMBAGO 04/05/2006   Depression 02/04/2006   Hemorrhoids, external 02/04/2006   Irritable bowel syndrome with constipation 02/04/2006    Past Medical History:  Diagnosis Date   Abnormal Pap smear 1995   Colpo   Allergy    Asthma    seasonal   Basal cell carcinoma 2010    Depression    DM type 2 (diabetes mellitus, type 2) (HCC)    Hemorrhoids    IBS (irritable bowel syndrome) 8-10 years ago   PONV (postoperative nausea and vomiting)    Rectal fissure     Past Surgical History:  Procedure Laterality Date   EYE SURGERY Right 2017   clogged tear duct   HEMORRHOID SURGERY  06/22/2019   SKIN CANCER EXCISION  2010   chest, basal cell   WISDOM TOOTH EXTRACTION  49 years old    Social History   Socioeconomic History   Marital status: Single    Spouse name: Not on file   Number of children: Not on file   Years of education: Not on file   Highest education level: Not on file  Occupational History   Occupation: teacher  Tobacco Use   Smoking status: Former Smoker    Packs/day: 0.50    Quit date: 11/10/1995    Years since quitting: 24.2   Smokeless tobacco: Never Used   Tobacco comment: social teeange yrs  Vaping Use   Vaping Use: Never used  Substance and Sexual Activity   Alcohol use: Yes    Comment: occasional   Drug use: No   Sexual activity: Yes    Partners: Male    Birth control/protection: Condom  Other Topics Concern   Not on file  Social History Narrative   Not on file   Social Determinants of Health   Financial Resource Strain:    Difficulty of Paying Living Expenses: Not on file  Food Insecurity:    Worried About Running Out of Food in the Last Year: Not on file  Ran Out of Food in the Last Year: Not on file  Transportation Needs:    Lack of Transportation (Medical): Not on file   Lack of Transportation (Non-Medical): Not on file  Physical Activity:    Days of Exercise per Week: Not on file   Minutes of Exercise per Session: Not on file  Stress:    Feeling of Stress : Not on file  Social Connections:    Frequency of Communication with Friends and Family: Not on file   Frequency of Social Gatherings with Friends and Family: Not on file   Attends Religious Services: Not on file   Active Member of [place] or  Organizations: Not on file   Attends Archivist Meetings: Not on file   Marital Status: Not on file  Intimate Partner Violence:    Fear of Current or Ex-Partner: Not on file   Emotionally Abused: Not on file   Physically Abused: Not on file   Sexually Abused: Not on file    Family History  Problem Relation Age of Onset   Hypertension Mother    Thyroid disease Mother    Hypertension Father    Heart attack Father    Diabetes Brother        type 1     Medications Prior to Admission  Medication Sig Dispense Refill Last Dose   albuterol (VENTOLIN HFA) 108 (90 Base) MCG/ACT inhaler Inhale 1 puff into the lungs every 4 (four) hours as needed for wheezing or shortness of breath. 18 g PRN 02/06/2020 at Unknown time   blood glucose meter kit and supplies KIT Dispense based on patient and insurance preference. Use up to four times daily as directed. (FOR ICD-9 250.00, 250.01). 1 each 0 Past Week at Unknown time   cetirizine (ZYRTEC) 10 MG tablet Take 10 mg by mouth daily.   02/07/2020 at 0730   diltiazem 2 % GEL USE ONE APPLICATION TO AFFECTED AREA FOUR TIMES DAILY. APPLY ON ANUS FOR 3-6 WEEKS TO ALLOW FISSURE TO HEAL.  3 Past Month at Unknown time   Semaglutide,0.25 or 0.5MG/DOS, (OZEMPIC, 0.25 OR 0.5 MG/DOSE,) 2 MG/1.5ML SOPN Inject 0.5 mg into the skin once a week. (Patient taking differently: Inject 0.5 mg into the skin once a week. saturday) 12 pen 3 Past Week at Unknown time   venlafaxine XR (EFFEXOR-XR) 37.5 MG 24 hr capsule TAKE 1 CAPSULE BY MOUTH DAILY WITH BREAKFAST. 90 capsule 1 02/07/2020 at 0730   fluticasone (FLONASE) 50 MCG/ACT nasal spray Place into both nostrils as needed.    More than a month at Unknown time    Current Facility-Administered Medications  Medication Dose Route Frequency Provider Last Rate Last Admin   bupivacaine liposome (EXPAREL) 1.3 % injection 266 mg  20 mL Infiltration Once Michael Boston, MD       Chlorhexidine Gluconate Cloth 2 % PADS 6 each  6  each Topical Once Michael Boston, MD       And   Chlorhexidine Gluconate Cloth 2 % PADS 6 each  6 each Topical Once Michael Boston, MD       clindamycin (CLEOCIN) IVPB 900 mg  900 mg Intravenous On Call to OR Michael Boston, MD       And   gentamicin (GARAMYCIN) 420 mg in dextrose 5 % 100 mL IVPB  5 mg/kg (Adjusted) Intravenous On Call to OR Michael Boston, MD       lactated ringers infusion   Intravenous Continuous Barnet Glasgow, MD 50 mL/hr  at 02/07/20 1225 New Bag at 02/07/20 1225     Allergies  Allergen Reactions   Amoxicillin Hives   Celexa [Citalopram Hydrobromide] Other (See Comments)    Fuzzy headed   Penicillins Hives   Fluoxetine Palpitations    BP (!) 133/94   Pulse 85   Temp 97.6 F (36.4 C) (Oral)   Resp 18   Ht 5' 6" (1.676 m)   Wt 124.8 kg   LMP 01/16/2020   SpO2 98%   BMI 44.42 kg/m   Labs: Results for orders placed or performed during the hospital encounter of 02/07/20 (from the past 48 hour(s))  Pregnancy, urine POC     Status: None   Collection Time: 02/07/20 11:39 AM  Result Value Ref Range   Preg Test, Ur NEGATIVE NEGATIVE    Comment:        THE SENSITIVITY OF THIS METHODOLOGY IS >24 mIU/mL   Glucose, capillary     Status: None   Collection Time: 02/07/20 11:58 AM  Result Value Ref Range   Glucose-Capillary 98 70 - 99 mg/dL    Comment: Glucose reference range applies only to samples taken after fasting for at least 8 hours.    Imaging / Studies: No results found.   [name], M.D., F.A.C.S. Gastrointestinal and Minimally Invasive Surgery [facility] [address] [place], [place] [address] [phone] Main / Paging  02/07/2020 1:07 PM    [name]

## 2020-02-07 NOTE — Anesthesia Postprocedure Evaluation (Signed)
Anesthesia Post Note  Patient: Kaitlyn Kirby  Procedure(s) Performed: ANAL SPHINCTEROTOMY.HEMORRHOIDECTOMY.ANORECTAL EXAMINATION UNDER ANESTHESIA, EXCISION OF ANAL POLYPS (N/A Rectum)     Patient location during evaluation: PACU Anesthesia Type: General Level of consciousness: awake and alert Pain management: pain level controlled Vital Signs Assessment: post-procedure vital signs reviewed and stable Respiratory status: spontaneous breathing, nonlabored ventilation and respiratory function stable Cardiovascular status: blood pressure returned to baseline and stable Postop Assessment: no apparent nausea or vomiting Anesthetic complications: no   No complications documented.  Last Vitals:  Vitals:   02/07/20 1515 02/07/20 1530  BP: 140/86 134/86  Pulse: 88 90  Resp: 20 18  Temp:    SpO2: 97% 96%    Last Pain:  Vitals:   02/07/20 1530  TempSrc:   PainSc: 2                  Lidia Collum

## 2020-02-07 NOTE — Transfer of Care (Signed)
Immediate Anesthesia Transfer of Care Note  Patient: Kaitlyn Kirby  Procedure(s) Performed: Procedure(s) (LRB): ANAL SPHINCTEROTOMY.HEMORRHOIDECTOMY.ANORECTAL EXAMINATION UNDER ANESTHESIA, EXCISION OF ANAL POLYPS (N/A)  Patient Location: PACU  Anesthesia Type: General  Level of Consciousness: awake, alert  and oriented  Airway & Oxygen Therapy: Patient Spontanous Breathing and Patient connected to face mask oxygen  Post-op Assessment: Report given to PACU RN and Post -op Vital signs reviewed and stable  Post vital signs: Reviewed and stable  Complications: No apparent anesthesia complications Last Vitals:  Vitals Value Taken Time  BP 154/99 02/07/20 1435  Temp 36.6 C 02/07/20 1435  Pulse 91 02/07/20 1442  Resp 22 02/07/20 1442  SpO2 100 % 02/07/20 1442  Vitals shown include unvalidated device data.  Last Pain:  Vitals:   02/07/20 1205  TempSrc: Oral  PainSc: 1       Patients Stated Pain Goal: 5 (21/30/86 5784)  Complications: No complications documented.

## 2020-02-07 NOTE — Discharge Instructions (Signed)
ANORECTAL SURGERY:  POST OPERATIVE INSTRUCTIONS  ######################################################################  EAT Start with a pureed / full liquid diet After 24 hours, gradually transition to a high fiber diet.    CONTROL PAIN Control pain so you can tolerate bowel movements,  walk, sleep, tolerate sneezing/coughing, and go up/down stairs.   HAVE A BOWEL MOVEMENT DAILY Keep your bowels regular to avoid problems.   Taking a fiber supplement every day to keep bowels soft.   Try a laxative to override constipation. Use an antidairrheal to slow down diarrhea.   Call if not better after 2 tries  WALK Walk an hour a day.  Control your pain to do that.   CALL IF YOU HAVE PROBLEMS/CONCERNS Call if you are still struggling despite following these instructions. Call if you have concerns not answered by these instructions  ######################################################################    1. Take your usually prescribed home medications unless otherwise directed.  2. DIET: Follow a light bland diet & liquids the first 24 hours after arrival home, such as soup, liquids, starches, etc.  Be sure to drink plenty of fluids.  Quickly advance to a usual solid diet within a few days.  Avoid fast food or heavy meals as your are more likely to get nauseated or have irregular bowels.  A low-fat, high-fiber diet for the rest of your life is ideal.  3. PAIN CONTROL: a. Pain is best controlled by a usual combination of three different methods TOGETHER: i. Ice/Heat ii. Over the counter pain medication iii. Prescription pain medication b. Expect swelling and discomfort in the anus/rectal area.  Warm water baths (30-60 minutes up to 6 times a day, especially after bowel meovements) will help. Use ice for the first few days to help decrease swelling and bruising, then switch to heat such as warm towels, sitz baths, warm baths, etc to help relax tight/sore spots and speed recovery.   Some people prefer to use ice alone, heat alone, alternating between ice & heat.  Experiment to what works for you.   c. It is helpful to take an over-the-counter pain medication continuously for the first few weeks.  Choose one of the following that works best for you: i. Naproxen (Aleve, etc)  Two 250m tabs twice a day ii. Ibuprofen (Advil, etc) Three 2072mtabs four times a day (every meal & bedtime) iii. Acetaminophen (Tylenol, etc) 500-65044mour times a day (every meal & bedtime) d. A  prescription for pain medication (such as oxycodone, hydrocodone, etc) should be given to you upon discharge.  Take your pain medication as prescribed.  i. If you are having problems/concerns with the prescription medicine (does not control pain, nausea, vomiting, rash, itching, etc), please call us [phone] see if we need to switch you to a different pain medicine that will work better for you and/or control your side effect better. ii. If you need a refill on your pain medication, please contact your pharmacy.  They will contact our office to request authorization. Prescriptions will not be filled after 5 pm or on week-ends.  If can take up to 48 hours for it to be filled & ready so avoid waiting until you are down to thel ast pill. e. A topical cream (Dibucaine) or a prescription for a cream (such as diltiazem 2% gel) may be given to you.  Many people find relief with topical creams.  Some people find it burns too much.  Experiment.  If it helps, use it.  If it burns, don't using  it.  Use a Sitz Bath 4-8 times a day for relief   [employer] A sitz bath is a warm water bath taken in the sitting position that covers only the hips and buttocks. It may be used for either healing or hygiene purposes. Sitz baths are also used to relieve pain, itching, or muscle spasms. The water may contain medicine. Moist heat will help you heal and relax.  HOME CARE INSTRUCTIONS  Take 3 to 4 sitz baths a day. 1. Fill the  bathtub half full with warm water. 2. Sit in the water and open the drain a little. 3. Turn on the warm water to keep the tub half full. Keep the water running constantly. 4. Soak in the water for 15 to 20 minutes. 5. After the sitz bath, pat the affected area dry first.   4. KEEP YOUR BOWELS REGULAR a. The goal is one soft bowel movement a day b. Avoid getting constipated.  Between the surgery and the pain medications, it is common to experience some constipation.  Increasing fluid intake and taking a fiber supplement (such as Metamucil, Citrucel, FiberCon, MiraLax, etc) 2-3 times a day regularly will usually help prevent this problem from occurring.  A mild laxative (prune juice, Milk of Magnesia, MiraLax, etc) should be taken according to package directions if there are no bowel movements after 48 hours. c. Watch out for diarrhea.  If you have many loose bowel movements, simplify your diet to bland foods & liquids for a few days.  Stop any stool softeners and decrease your fiber supplement.  Switching to mild anti-diarrheal medications (Kayopectate, Pepto Bismol) can help.  Can try an imodium/loperamide dose.  If this worsens or does not improve, please call us.  5. Wound Care  a. Remove your bandages with your first bowel movement, usually the day after surgery.  Let the gauze fall off with the first bowel movement or shower.   b. Wear an absorbent pad or soft cotton balls in your underwear as needed to catch any drainage and help keep the area  c. Keep the area clean and dry.  Bathe / shower every day.  Keep the area clean by showering / bathing over the incision / wound.   It is okay to soak an open wound to help wash it.  Consider using a squeeze bottle filled with warm water to gently wash the anal area.  Wet wipes or showers / gentle washing after bowel movements is often less traumatic than regular toilet paper. d. Dennis Bast will often notice bleeding with bowel movements.  This should slow down  by the end of the first week of surgery.  Sitting on an ice pack can help. e. Expect some drainage.  This should slow down by the end of the first week of surgery, but you will have occasional bleeding or drainage up to a few months after surgery.  Wear an absorbent pad or soft cotton gauze in your underwear until the drainage stops.  6. ACTIVITIES as tolerated:   a. You may resume regular (light) daily activities beginning the next day--such as daily self-care, walking, climbing stairs--gradually increasing activities as tolerated.  If you can walk 30 minutes without difficulty, it is safe to try more intense activity such as jogging, treadmill, bicycling, low-impact aerobics, swimming, etc. b. Save the most intensive and strenuous activity for last such as sit-ups, heavy lifting, contact sports, etc  Refrain from any heavy lifting or straining until you are off narcotics for  pain control.   c. DO NOT PUSH THROUGH PAIN.  Let pain be your guide: If it hurts to do something, don't do it.  Pain is your body warning you to avoid that activity for another week until the pain goes down. d. You may drive when you are no longer taking prescription pain medication, you can comfortably sit for long periods of time, and you can safely maneuver your car and apply brakes. e. Dennis Bast may have sexual intercourse when it is comfortable.  7. FOLLOW UP in our office a. Please call [facility] at [phone] to set up an appointment to see your surgeon in the office for a follow-up appointment approximately 2-3 weeks after your surgery. b. Make sure that you call for this appointment the day you arrive home to ensure a convenient appointment time.  8. IF YOU HAVE DISABILITY OR FAMILY LEAVE FORMS, BRING THEM TO THE OFFICE FOR PROCESSING.  DO NOT GIVE THEM TO YOUR DOCTOR.        WHEN TO CALL us [phone]: 1. Poor pain control 2. Reactions / problems with new medications (rash/itching, nausea, etc)  3. Fever over  101.5 F (38.5 C) 4. Inability to urinate 5. Nausea and/or vomiting 6. Worsening swelling or bruising 7. Continued bleeding from incision. 8. Increased pain, redness, or drainage from the incision  The clinic staff is available to answer your questions during regular business hours (8:30am-5pm).  Please don't hesitate to call and ask to speak to one of our nurses for clinical concerns.   A surgeon from [facility] is always on call at the hospitals   If you have a medical emergency, go to the nearest emergency room or call 911.    [facility], [place], [place], [place], [place]  [address] ? MAIN: [phone] ? TOLL FREE: [phone] ? FAX [phone] [email or web address]     WHAT IS AN ANAL FISSURE? An anal fissure (fissure-in-ano) is a small, oval shaped tear in skin that lines the opening of the anus. Fissures typically cause severe pain and bleeding with bowel movements. Fissures are quite common in the general population, but are often confused with other causes of pain and bleeding, such as hemorrhoids.  WHAT ARE THE SYMPTOMS OF AN ANAL FISSURE? The typical symptoms of an anal fissure include severe pain during, and especially after, a bowel movement, lasting from several minutes to a few hours. Patients may also notice bright red blood from the anus that can be seen on the toilet paper or on the stool. Between bowel movements, patients with anal fissures are often relatively symptom-free. Many patients are fearful of having a bowel movement and may try to avoid defecation secondary to the pain.   WHAT CAUSES AN ANAL FISSURE? Fissures are usually caused by trauma to the inner lining of the anus. Patients with tight anal sphincter muscles (i.e., increased muscle tone) are more prone to developing anal fissures. A hard, dry bowel movement is typically responsible, but loose stools and diarrhea can also be the cause. Following  a bowel movement, severe anal pain can produce spasm of the anal sphincter muscle, resulting in a decrease in blood flow to the site of the injury, thus impairing healing of the wound. The next bowel movement results in more pain, anal spasm, decreased blood flow to the area, and the cycle continues. Treatments are aimed at interrupting this cycle by relaxing the anal sphincter muscle to promote healing of the fissure.  Other, less common, causes include inflammatory conditions and certain anal infections or tumors. Anal fissures may be acute (recent onset) or chronic (present for a long period of time). Chronic fissures may be more difficult to treat, and may also have an external lump associated with the tear, called a sentinel pile or skin tag, as well as extra tissue just inside the anal canal (hypertrophied papilla) .  WHAT IS THE TREATMENT OF ANAL FISSURES? The majority of anal fissures do not require surgery. The most common treatment for an acute anal fissure consists of making the stool more formed and bulky with a diet high in fiber and utilization of over-the-counter fiber supplementation (totaling 25-35 grams of fiber/day). Stool softeners and increasing water intake may be necessary to promote soft bowel movements and aid in the healing process. Topical anesthetics for pain and warm tub baths (sitz baths) for 10-20 minutes several times a day (especially after bowel movements) are soothing and promote relaxation of the anal muscles, which may help the healing process.  Other medications (such as diltiazem) may be prescribed that allow relaxation of the anal sphincter muscles. Your surgeon will go over benefits and side-effects of each of these with you. Narcotic pain medications are not recommended for anal fissures, as they promote constipation. Chronic fissures are generally more difficult to treat, and your surgeon may advise surgical treatment.  WILL THE PROBLEM RETURN? Fissures can recur  easily, and it is quite common for a fully healed fissure to recur after a hard bowel movement or other trauma. Even when the pain and bleeding have subsided, it is very important to continue good bowel habits and a diet high in fiber as a lifestyle change. If the problem returns without an obvious cause, further assessment is warranted.  GETTING TO GOOD BOWEL HEALTH. Irregular bowel habits such as constipation and diarrhea can lead to many problems over time.  Having one soft bowel movement a day is the most important way to prevent further problems.  The anorectal canal is designed to handle stretching and feces to safely manage our ability to get rid of solid waste (feces, poop, stool) out of our body.  BUT, hard constipated stools can act like ripping concrete bricks and diarrhea can be a burning fire to this very sensitive area of our body, causing inflamed hemorrhoids, anal fissures, increasing risk is perirectal abscesses, abdominal pain/bloating, an making irritable bowel worse.     The goal: ONE SOFT BOWEL MOVEMENT A DAY!  To have soft, regular bowel movements:  . Drink at least 8 tall glasses of water a day.   . Take plenty of fiber.  Fiber is the undigested part of plant food that passes into the colon, acting s "natures broom" to encourage bowel motility and movement.  Fiber can absorb and hold large amounts of water. This results in a larger, bulkier stool, which is soft and easier to pass. Work gradually over several weeks up to 6 servings a day of fiber (25g a day even more if needed) in the form of: o Vegetables -- Root (potatoes, carrots, turnips), leafy green (lettuce, salad greens, celery, spinach), or cooked high residue (cabbage, broccoli, etc) o Fruit -- Fresh (unpeeled skin & pulp), Dried (prunes, apricots, cherries, etc ),  or stewed ( applesauce)  o Whole grain breads, pasta, etc (whole wheat)  o Bran cereals  . Bulking Agents -- This type of water-retaining fiber generally is  easily obtained each day by one of the following:  o Psyllium bran -- The psyllium plant is remarkable because its ground seeds can retain so much water. This product is available as Metamucil, Konsyl, Effersyllium, Per Diem Fiber, or the less expensive generic preparation in drug and health food stores. Although labeled a laxative, it really is not a laxative.  o Methylcellulose -- This is another fiber derived from wood which also retains water. It is available as Citrucel. o Polyethylene Glycol - and "artificial" fiber commonly called Miralax or Glycolax.  It is helpful for people with gassy or bloated feelings with regular fiber o Flax Seed - a less gassy fiber than psyllium . No reading or other relaxing activity while on the toilet. If bowel movements take longer than 5 minutes, you are too constipated . AVOID CONSTIPATION.  High fiber and water intake usually takes care of this.  Sometimes a laxative is needed to stimulate more frequent bowel movements, but  . Laxatives are not a good long-term solution as it can wear the colon out. o Osmotics (Milk of Magnesia, Fleets phosphosoda, Magnesium citrate, MiraLax, GoLytely) are safer than  o Stimulants (Senokot, Castor Oil, Dulcolax, Ex Lax)    o Do not take laxatives for more than 7days in a row. .  IF SEVERELY CONSTIPATED, try a Bowel Retraining Program: o Do not use laxatives.  o Eat a diet high in roughage, such as bran cereals and leafy vegetables.  o Drink six (6) ounces of prune or apricot juice each morning.  o Eat two (2) large servings of stewed fruit each day.  o Take one (1) heaping tablespoon of a psyllium-based bulking agent twice a day. Use sugar-free sweetener when possible to avoid excessive calories.  o Eat a normal breakfast.  o Set aside 15 minutes after breakfast to sit on the toilet, but do not strain to have a bowel movement.  o If you do not have a bowel movement by the third day, use an enema and repeat the above steps.   . Controlling diarrhea o Switch to liquids and simpler foods for a few days to avoid stressing your intestines further. o Avoid dairy products (especially milk & ice cream) for a short time.  The intestines often can lose the ability to digest lactose when stressed. o Avoid foods that cause gassiness or bloating.  Typical foods include beans and other legumes, cabbage, broccoli, and dairy foods.  Every person has some sensitivity to other foods, so listen to our body and avoid those foods that trigger problems for you. o Adding fiber (Citrucel, Metamucil, psyllium, Miralax) gradually can help thicken stools by absorbing excess fluid and retrain the intestines to act more normally.  Slowly increase the dose over a few weeks.  Too much fiber too soon can backfire and cause cramping & bloating. o Probiotics (such as active yogurt, Align, etc) may help repopulate the intestines and colon with normal bacteria and calm down a sensitive digestive tract.  Most studies show it to be of mild help, though, and such products can be costly. o Medicines: - Bismuth subsalicylate (ex. Kayopectate, Pepto Bismol) every 30 minutes for up to 6 doses can help control diarrhea.  Avoid if pregnant. - Loperamide (Immodium) can slow down diarrhea.  Start with two tablets (4mg  total) first and then try one tablet every 6 hours.  Avoid if you are having fevers or severe pain.  If you are not better or start feeling worse, stop all medicines and call your doctor for advice o Call your  doctor if you are getting worse or not better.  Sometimes further testing (cultures, endoscopy, X-ray studies, bloodwork, etc) may be needed to help diagnose and treat the cause of the diarrhea. o   WHAT CAN BE DONE IF THE FISSURE DOES NOT HEAL? A fissure that fails to respond to conservative measures should be re-examined. Persistent hard or loose bowel movements, scarring, or spasm of the internal anal muscle all contribute to delayed healing.  Other medical problems such as inflammatory bowel disease (Crohn's disease), infections, or anal tumors can cause symptoms similar to anal fissures. Patients suffering from persistent anal pain should be examined to exclude these symptoms. This may include a colonoscopy or an exam in the operating room under anesthesia.  WHAT DOES SURGERY INVOLVE? Surgical options for treating anal fissure include Botulinum toxin (Botox) injection into the anal sphincter and surgical division of a portion of the internal anal sphincter (lateral internal sphincterotomy). Both of these are performed typically as outpatient, same-day procedures, or occasionally in the office setting. The goal of these surgical options is to promote relaxation of the anal sphincter, thereby decreasing anal pain and spasm, allowing the fissure to heal. Botox injection results in healing in 50-80% of patients, while sphincterotomy is reported to be over 90% successful. If a sentinel pile is present, it may be removed to promote healing of the fissure. All surgical procedures carry some risk, and a sphincterotomy can rarely interfere with one's ability to control gas and stool. Your colon and rectal surgeon will discuss these risks with you to determine the appropriate treatment for your particular situation.  HOW LONG IS THE RECOVERY AFTER SURGERY? It is important to note that complete healing with both medical and surgical treatments can take up to approximately 6-10 weeks. However, acute pain after surgery often disappears after a few days. Most patients will be able to return to work and resume daily activities in a few short days after the surgery.  CAN FISSURES LEAD TO COLON CANCER? Absolutely not. Persistent symptoms, however, need careful evaluation since other conditions other than an anal fissure can cause similar symptoms. Your colon and rectal surgeon may request additional tests, even if your fissure has successfully healed. A  colonoscopy may be required to exclude other causes of rectal bleeding.     Post Anesthesia Home Care Instructions  Activity: Get plenty of rest for the remainder of the day. A responsible individual must stay with you for 24 hours following the procedure.  For the next 24 hours, DO NOT: -Drive a car -Paediatric nurse -Drink alcoholic beverages -Take any medication unless instructed by your physician -Make any legal decisions or sign important papers.  Meals: Start with liquid foods such as gelatin or soup. Progress to regular foods as tolerated. Avoid greasy, spicy, heavy foods. If nausea and/or vomiting occur, drink only clear liquids until the nausea and/or vomiting subsides. Call your physician if vomiting continues.  Special Instructions/Symptoms: Your throat may feel dry or sore from the anesthesia or the breathing tube placed in your throat during surgery. If this causes discomfort, gargle with warm salt water. The discomfort should disappear within 24 hours.  If you had a scopolamine patch placed behind your ear for the management of post- operative nausea and/or vomiting:  1. The medication in the patch is effective for 72 hours, after which it should be removed.  Wrap patch in a tissue and discard in the trash. Wash hands thoroughly with soap and water. 2. You may remove the  patch earlier than 72 hours if you experience unpleasant side effects which may include dry mouth, dizziness or visual disturbances. 3. Avoid touching the patch. Wash your hands with soap and water after contact with the patch.    Do not take any Tylenol until after 6:15 pm today.

## 2020-02-07 NOTE — Anesthesia Procedure Notes (Addendum)
Procedure Name: Intubation Date/Time: 02/07/2020 1:33 PM Performed by: Mechele Claude, CRNA Pre-anesthesia Checklist: Patient identified, Emergency Drugs available, Suction available and Patient being monitored Patient Re-evaluated:Patient Re-evaluated prior to induction Oxygen Delivery Method: Circle system utilized Preoxygenation: Pre-oxygenation with 100% oxygen Induction Type: IV induction Ventilation: Mask ventilation without difficulty Laryngoscope Size: Mac and 3 Grade View: Grade II Tube type: Oral Tube size: 7.0 mm Number of attempts: 1 Airway Equipment and Method: Stylet and Oral airway Placement Confirmation: ETT inserted through vocal cords under direct vision,  positive ETCO2 and breath sounds checked- equal and bilateral Secured at: 21 cm Tube secured with: Tape Dental Injury: Teeth and Oropharynx as per pre-operative assessment

## 2020-02-08 ENCOUNTER — Encounter (HOSPITAL_BASED_OUTPATIENT_CLINIC_OR_DEPARTMENT_OTHER): Payer: Self-pay | Admitting: Surgery

## 2020-02-08 LAB — SURGICAL PATHOLOGY

## 2020-02-27 ENCOUNTER — Encounter: Payer: BC Managed Care – PPO | Admitting: Gastroenterology

## 2020-03-01 ENCOUNTER — Other Ambulatory Visit: Payer: Self-pay | Admitting: Family Medicine

## 2020-03-01 DIAGNOSIS — R059 Cough, unspecified: Secondary | ICD-10-CM

## 2020-04-10 ENCOUNTER — Telehealth: Payer: Self-pay | Admitting: Family Medicine

## 2020-04-10 NOTE — Telephone Encounter (Signed)
MyChart message sent regarding taking a statin.

## 2020-04-30 MED ORDER — ATORVASTATIN CALCIUM 10 MG PO TABS: 10.0000 mg | ORAL_TABLET | Freq: Every day | ORAL | 3 refills | Status: DC

## 2020-04-30 NOTE — Addendum Note (Signed)
Addended by: Beatrice Lecher D on: 04/30/2020 12:41 PM   Modules accepted: Orders

## 2020-05-16 ENCOUNTER — Ambulatory Visit (INDEPENDENT_AMBULATORY_CARE_PROVIDER_SITE_OTHER): Payer: BC Managed Care – PPO

## 2020-05-16 ENCOUNTER — Other Ambulatory Visit: Payer: Self-pay

## 2020-05-16 ENCOUNTER — Other Ambulatory Visit: Payer: Self-pay | Admitting: Family Medicine

## 2020-05-16 DIAGNOSIS — Z1231 Encounter for screening mammogram for malignant neoplasm of breast: Secondary | ICD-10-CM | POA: Diagnosis not present

## 2020-05-21 ENCOUNTER — Other Ambulatory Visit: Payer: Self-pay

## 2020-05-21 ENCOUNTER — Encounter: Payer: Self-pay | Admitting: Family Medicine

## 2020-05-21 ENCOUNTER — Ambulatory Visit (INDEPENDENT_AMBULATORY_CARE_PROVIDER_SITE_OTHER): Payer: BC Managed Care – PPO | Admitting: Family Medicine

## 2020-05-21 VITALS — BP 109/77 | HR 97 | Ht 66.0 in | Wt 273.0 lb

## 2020-05-21 DIAGNOSIS — E785 Hyperlipidemia, unspecified: Secondary | ICD-10-CM

## 2020-05-21 DIAGNOSIS — L821 Other seborrheic keratosis: Secondary | ICD-10-CM

## 2020-05-21 DIAGNOSIS — R059 Cough, unspecified: Secondary | ICD-10-CM

## 2020-05-21 DIAGNOSIS — F3341 Major depressive disorder, recurrent, in partial remission: Secondary | ICD-10-CM

## 2020-05-21 DIAGNOSIS — E119 Type 2 diabetes mellitus without complications: Secondary | ICD-10-CM

## 2020-05-21 DIAGNOSIS — L918 Other hypertrophic disorders of the skin: Secondary | ICD-10-CM | POA: Diagnosis not present

## 2020-05-21 LAB — POCT GLYCOSYLATED HEMOGLOBIN (HGB A1C): Hemoglobin A1C: 6.2 % — AB (ref 4.0–5.6)

## 2020-05-21 MED ORDER — VENLAFAXINE HCL ER 37.5 MG PO CP24: 37.5000 mg | ORAL_CAPSULE | Freq: Every day | ORAL | 1 refills | Status: DC

## 2020-05-21 MED ORDER — ALBUTEROL SULFATE HFA 108 (90 BASE) MCG/ACT IN AERS: 1.0000 | INHALATION_SPRAY | RESPIRATORY_TRACT | 99 refills | Status: DC | PRN

## 2020-05-21 NOTE — Assessment & Plan Note (Signed)
She is down a couple pounds since she was last here just encouraged her to continue to work on [employer] and staying active.

## 2020-05-21 NOTE — Assessment & Plan Note (Addendum)
Hemoglobin A1c of 6.2.  Hemoglobin A1c still looks good today so we will continue with current regimen just encouraged her to continue to work on healthy diet and regular exercise.  She has lost some weight since she was last year which is fantastic.  Follow-up 3 months.

## 2020-05-21 NOTE — Progress Notes (Addendum)
Established Patient Office Visit  Subjective:  Patient ID: Kaitlyn Kirby, female    DOB: Dec 09, 1970  Age: 50 y.o. MRN: [id number]  CC:  Chief Complaint  Patient presents with  . Diabetes    HPI Kaitlyn Kirby presents for   Diabetes - no hypoglycemic events. No wounds or sores that are not healing well. No increased thirst or urination. Checking glucose at home. Taking medications as prescribed without any side effects.  She was worried her blood sugars will be a little bit more elevated recently.  Doing well with the Ozempic injection though.  F/U Depression -currently on venlafaxine 37.5 mg daily she is happy with her current dosing regimen and feels like it is working well.  Past Medical History:  Diagnosis Date  . Abnormal Pap smear 1995   Colpo  . Allergy   . Asthma    seasonal  . Basal cell carcinoma 2010  . Depression   . DM type 2 (diabetes mellitus, type 2) ([place])   . Hemorrhoids   . IBS (irritable bowel syndrome) 8-10 years ago  . PONV (postoperative nausea and vomiting)   . Rectal fissure     Past Surgical History:  Procedure Laterality Date  . EYE SURGERY Right 2017   clogged tear duct  . HEMORRHOID SURGERY  06/22/2019  . MINOR SPHINCTEROTOMY N/A 02/07/2020   Procedure: ANAL SPHINCTEROTOMY.HEMORRHOIDECTOMY.ANORECTAL EXAMINATION UNDER ANESTHESIA, EXCISION OF ANAL POLYPS;  Surgeon: Michael Boston, MD;  Location: [facility];  Service: General;  Laterality: N/A;  . SKIN CANCER EXCISION  2010   chest, basal cell  . WISDOM TOOTH EXTRACTION  50 years old    Family History  Problem Relation Age of Onset  . Hypertension Mother   . Thyroid disease Mother   . Hypertension Father   . Heart attack Father   . Diabetes Brother        type 1     Social History   Socioeconomic History  . Marital status: Single    Spouse name: Not on file  . Number of children: Not on file  . Years of education: Not on file  . Highest education level: Not on  file  Occupational History  . Occupation: Pharmacist, hospital  Tobacco Use  . Smoking status: Former Smoker    Packs/day: 0.50    Quit date: 11/10/1995    Years since quitting: 24.5  . Smokeless tobacco: Never Used  . Tobacco comment: social teeange yrs  Vaping Use  . Vaping Use: Never used  Substance and Sexual Activity  . Alcohol use: Yes    Comment: occasional  . Drug use: No  . Sexual activity: Yes    Partners: Male    Birth control/protection: Condom  Other Topics Concern  . Not on file  Social History Narrative  . Not on file   Social Determinants of Health   Financial Resource Strain: Not on file  Food Insecurity: Not on file  Transportation Needs: Not on file  Physical Activity: Not on file  Stress: Not on file  Social Connections: Not on file  Intimate Partner Violence: Not on file    Outpatient Medications Prior to Visit  Medication Sig Dispense Refill  . atorvastatin (LIPITOR) 10 MG tablet Take 1 tablet (10 mg total) by mouth daily. 90 tablet 3  . blood glucose meter kit and supplies KIT Dispense based on patient and insurance preference. Use up to four times daily as directed. (FOR ICD-9 250.00, 250.01). 1 each 0  .  cetirizine (ZYRTEC) 10 MG tablet Take 10 mg by mouth daily.    . fluticasone (FLONASE) 50 MCG/ACT nasal spray Place into both nostrils as needed.     . Semaglutide,0.25 or 0.5MG/DOS, (OZEMPIC, 0.25 OR 0.5 MG/DOSE,) 2 MG/1.5ML SOPN Inject 0.5 mg into the skin once a week. (Patient taking differently: Inject 0.5 mg into the skin once a week. saturday) 12 pen 3  . albuterol (VENTOLIN HFA) 108 (90 Base) MCG/ACT inhaler INHALE 1 PUFF INTO THE LUNGS EVERY 4 (FOUR) HOURS AS NEEDED FOR WHEEZING OR SHORTNESS OF BREATH. 18 each PRN  . venlafaxine XR (EFFEXOR-XR) 37.5 MG 24 hr capsule TAKE 1 CAPSULE BY MOUTH DAILY WITH BREAKFAST. 90 capsule 1  . diazepam (VALIUM) 5 MG tablet Take 1 tablet (5 mg total) by mouth every 6 (six) hours as needed for muscle spasms (difficulty  urinating). 8 tablet 2  . diltiazem 2 % GEL USE ONE APPLICATION TO AFFECTED AREA FOUR TIMES DAILY. APPLY ON ANUS FOR 3-6 WEEKS TO ALLOW FISSURE TO HEAL.  3  . oxyCODONE (OXY IR/ROXICODONE) 5 MG immediate release tablet Take 1-2 tablets (5-10 mg total) by mouth every 6 (six) hours as needed for moderate pain, severe pain or breakthrough pain. 30 tablet 0   No facility-administered medications prior to visit.    Allergies  Allergen Reactions  . Amoxicillin Hives  . Celexa [Citalopram Hydrobromide] Other (See Comments)    Fuzzy headed  . Penicillins Hives  . Fluoxetine Palpitations    ROS Review of Systems    Objective:    Physical Exam Constitutional:      Appearance: She is well-developed and well-nourished.  HENT:     Head: Normocephalic and atraumatic.  Cardiovascular:     Rate and Rhythm: Normal rate and regular rhythm.     Heart sounds: Normal heart sounds.  Pulmonary:     Effort: Pulmonary effort is normal.     Breath sounds: Normal breath sounds.  Skin:    General: Skin is warm and dry.  Neurological:     Mental Status: She is alert and oriented to person, place, and time.  Psychiatric:        Mood and Affect: Mood and affect normal.        Behavior: Behavior normal.     BP 109/77   Pulse 97   Ht 5' 6"  (1.676 m)   Wt 273 lb (123.8 kg)   SpO2 97%   BMI 44.06 kg/m  Wt Readings from Last 3 Encounters:  05/21/20 273 lb (123.8 kg)  02/07/20 275 lb 3.2 oz (124.8 kg)  12/08/19 274 lb (124.3 kg)     Health Maintenance Due  Topic Date Due  . PNEUMOCOCCAL POLYSACCHARIDE VACCINE AGE 60-64 HIGH RISK  Never done  . URINE MICROALBUMIN  Never done  . COLONOSCOPY (Pts 45-41yr Insurance coverage will need to be confirmed)  Never done  . COVID-19 Vaccine (3 - Inadvertent risk 4-dose series) 09/11/2019  . FOOT EXAM  02/15/2020    There are no preventive care reminders to display for this patient.  Lab Results  Component Value Date   TSH 2.80 10/31/2018   Lab  Results  Component Value Date   WBC 10.2 02/05/2020   HGB 13.7 02/05/2020   HCT 40.5 02/05/2020   MCV 93.3 02/05/2020   PLT 262 02/05/2020   Lab Results  Component Value Date   NA 132 (L) 02/05/2020   K 4.1 02/05/2020   CO2 25 02/05/2020   GLUCOSE 110 (H)  02/05/2020   BUN 10 02/05/2020   CREATININE 0.86 02/05/2020   BILITOT 0.5 12/29/2019   ALKPHOS 44 11/11/2011   AST 11 12/29/2019   ALT 12 12/29/2019   PROT 6.5 12/29/2019   ALBUMIN 4.2 11/11/2011   CALCIUM 8.7 (L) 02/05/2020   ANIONGAP 6 02/05/2020   Lab Results  Component Value Date   CHOL 192 12/29/2019   Lab Results  Component Value Date   HDL 35 (L) 12/29/2019   Lab Results  Component Value Date   LDLCALC 129 (H) 12/29/2019   Lab Results  Component Value Date   TRIG 163 (H) 12/29/2019   Lab Results  Component Value Date   CHOLHDL 5.5 (H) 12/29/2019   Lab Results  Component Value Date   HGBA1C 6.2 (A) 05/21/2020      Assessment & Plan:   Problem List Items Addressed This Visit      Endocrine   Controlled type 2 diabetes mellitus ([place]) - Primary    Hemoglobin A1c of 6.2.  Hemoglobin A1c still looks good today so we will continue with current regimen just encouraged her to continue to work on healthy diet and regular exercise.  She has lost some weight since she was last year which is fantastic.  Follow-up 3 months.      Relevant Orders   POCT glycosylated hemoglobin (Hb A1C) (Completed)     Other   Severe obesity (BMI >= 40) (HCC)    She is down a couple pounds since she was last here just encouraged her to continue to work on healthy food choices and staying active.      Hyperlipidemia    Had actually been off her statin for a while but says that she did restart it recently and so far is been tolerating it well.      Depression    Be with current dose of Effexor continue current regimen and plan to follow-up in 6 months.      Relevant Medications   venlafaxine XR (EFFEXOR-XR) 37.5 MG 24  hr capsule    Other Visit Diagnoses    Cough       Relevant Medications   albuterol (VENTOLIN HFA) 108 (90 Base) MCG/ACT inhaler   Skin tag       Seborrheic keratoses          Skin Tag Removal Procedure Note Diagnosis: inflamed skin tags Location: left inner thigh Informed Consent: Discussed risks (permanent scarring, infection, pain, bleeding, bruising, redness, and recurrence of the lesion) and benefits of the procedure, as well as the alternatives. She is aware that skin tags are benign lesions, and their removal is often not considered medically necessary. Informed consent was obtained. Preparation: The area was prepared in a standard fashion. Anesthesia: not required Procedure Details: Iris scissors were used to perform sharp removal. Aluminum chloride was applied for hemostasis. Ointment and bandage were applied where needed. The patient tolerated the procedure well. Total number of lesions treated: 2 Plan: The patient was instructed on post-op care. Recommend OTC analgesia as needed for pain.   Cryotherapy Procedure Note  Pre-operative Diagnosis:seb keratoses  Post-operative Diagnosis: same  Locations: Right outer thigh, right shoulder, neck  Indications: getting caught on clothing  Anesthesia: none  Procedure Details  Patient informed of risks (permanent scarring, infection, light or dark discoloration, bleeding, infection, weakness, numbness and recurrence of the lesion) and benefits of the procedure and verbal informed consent obtained.  The areas are treated with liquid nitrogen therapy, frozen until ice  ball extended 1-2 mm beyond lesion, allowed to thaw, and treated again. The patient tolerated procedure well.  The patient was instructed on post-op care, warned that there may be blister formation, redness and pain. Recommend OTC analgesia as needed for pain.  Condition: Stable  Complications: none.  Plan: 1. Instructed to keep the area dry and covered for  24-48h and clean thereafter. 2. Warning signs of infection were reviewed.   3. Recommended that the patient use OTC acetaminophen as needed for pain.  4. Return PRN   Meds ordered this encounter  Medications  . albuterol (VENTOLIN HFA) 108 (90 Base) MCG/ACT inhaler    Sig: Inhale 1 puff into the lungs every 4 (four) hours as needed for wheezing or shortness of breath.    Dispense:  18 each    Refill:  PRN  . venlafaxine XR (EFFEXOR-XR) 37.5 MG 24 hr capsule    Sig: Take 1 capsule (37.5 mg total) by mouth daily with breakfast.    Dispense:  90 capsule    Refill:  1    Follow-up: Return in about 3 months (around 08/18/2020) for Diabetes follow-up.    Beatrice Lecher, MD

## 2020-05-21 NOTE — Assessment & Plan Note (Signed)
Be with current dose of Effexor continue current regimen and plan to follow-up in 6 months.

## 2020-05-21 NOTE — Assessment & Plan Note (Signed)
Had actually been off her statin for a while but says that she did restart it recently and so far is been tolerating it well.

## 2020-08-18 ENCOUNTER — Other Ambulatory Visit: Payer: Self-pay | Admitting: Family Medicine

## 2020-09-24 ENCOUNTER — Other Ambulatory Visit: Payer: Self-pay

## 2020-09-24 ENCOUNTER — Other Ambulatory Visit (HOSPITAL_COMMUNITY)
Admission: RE | Admit: 2020-09-24 | Discharge: 2020-09-24 | Disposition: A | Discharge status: Home / Self Care | Payer: BC Managed Care – PPO | Source: Ambulatory Visit | Attending: Obstetrics and Gynecology | Admitting: Obstetrics and Gynecology

## 2020-09-24 ENCOUNTER — Encounter: Payer: Self-pay | Admitting: Obstetrics and Gynecology

## 2020-09-24 ENCOUNTER — Ambulatory Visit (INDEPENDENT_AMBULATORY_CARE_PROVIDER_SITE_OTHER): Payer: BC Managed Care – PPO | Admitting: Obstetrics and Gynecology

## 2020-09-24 VITALS — BP 117/76 | HR 86 | Resp 16 | Ht 66.0 in | Wt 276.0 lb

## 2020-09-24 DIAGNOSIS — Z01419 Encounter for gynecological examination (general) (routine) without abnormal findings: Secondary | ICD-10-CM | POA: Insufficient documentation

## 2020-09-24 NOTE — Progress Notes (Signed)
Last pap 2018

## 2020-09-24 NOTE — Progress Notes (Signed)
GYNECOLOGY ANNUAL PREVENTATIVE CARE ENCOUNTER NOTE  History:     Kaitlyn Kirby is a 50 y.o. G0P0 female here for a routine annual gynecologic exam.  Current complaints: none, recent hemorrhoid surgery.  Denies abnormal vaginal bleeding, discharge, pelvic pain, problems with intercourse or other gynecologic concerns.  Regular and irregular periods  Gynecologic History Patient's last menstrual period was 08/15/2020. Contraception: none Last Pap: 2018.  Results were: normal with negative HPV Last mammogram: 2022. Results were: normal  Obstetric History OB History  Gravida Para Term Preterm AB Living  0            SAB IAB Ectopic Multiple Live Births               Past Medical History:  Diagnosis Date   Abnormal Pap smear 1995   Colpo   Allergy    Asthma    seasonal   Basal cell carcinoma 2010   Depression    DM type 2 (diabetes mellitus, type 2) ([facility])    Hemorrhoids    IBS (irritable bowel syndrome) 8-10 years ago   PONV (postoperative nausea and vomiting)    Rectal fissure     Past Surgical History:  Procedure Laterality Date   EYE SURGERY Right 2017   clogged tear duct   HEMORRHOID SURGERY  06/22/2019   MINOR SPHINCTEROTOMY N/A 02/07/2020   Procedure: ANAL SPHINCTEROTOMY.HEMORRHOIDECTOMY.ANORECTAL EXAMINATION UNDER ANESTHESIA, EXCISION OF ANAL POLYPS;  Surgeon: Michael Boston, MD;  Location: [facility];  Service: General;  Laterality: N/A;   SKIN CANCER EXCISION  2010   chest, basal cell   WISDOM TOOTH EXTRACTION  50 years old    Current Outpatient Medications on File Prior to Visit  Medication Sig Dispense Refill   albuterol (VENTOLIN HFA) 108 (90 Base) MCG/ACT inhaler Inhale 1 puff into the lungs every 4 (four) hours as needed for wheezing or shortness of breath. 18 each PRN   atorvastatin (LIPITOR) 10 MG tablet Take 1 tablet (10 mg total) by mouth daily. 90 tablet 3   blood glucose meter kit and supplies KIT Dispense based on patient and  insurance preference. Use up to four times daily as directed. (FOR ICD-9 250.00, 250.01). 1 each 0   cetirizine (ZYRTEC) 10 MG tablet Take 10 mg by mouth daily.     fluticasone (FLONASE) 50 MCG/ACT nasal spray Place into both nostrils as needed.      OZEMPIC, 0.25 OR 0.5 MG/DOSE, 2 MG/1.5ML SOPN INJECT 0.5 MG INTO THE SKIN ONCE A WEEK. 4.5 mL 10   venlafaxine XR (EFFEXOR-XR) 37.5 MG 24 hr capsule Take 1 capsule (37.5 mg total) by mouth daily with breakfast. 90 capsule 1   No current facility-administered medications on file prior to visit.    Allergies  Allergen Reactions   Amoxicillin Hives   Celexa [Citalopram Hydrobromide] Other (See Comments)    Fuzzy headed   Penicillins Hives   Fluoxetine Palpitations    Social History:  reports that she quit smoking about 24 years ago. Her smoking use included cigarettes. She smoked an average of 0.50 packs per day. She has never used smokeless tobacco. She reports current alcohol use. She reports that she does not use drugs.  Family History  Problem Relation Age of Onset   Hypertension Mother    Thyroid disease Mother    Hypertension Father    Heart attack Father    Diabetes Brother        type 1  The following portions of the patient's history were reviewed and updated as appropriate: allergies, current medications, past family history, past medical history, past social history, past surgical history and problem list.  Review of Systems Pertinent items noted in HPI and remainder of comprehensive ROS otherwise negative.  Physical Exam:  BP 117/76   Pulse 86   Resp 16   Ht 5' 6"  (1.676 m)   Wt 276 lb (125.2 kg)   LMP 08/15/2020   BMI 44.55 kg/m  CONSTITUTIONAL: Well-developed, well-nourished female in no acute distress.  HENT:  Normocephalic, atraumatic, External right and left ear normal.  EYES: Conjunctivae and EOM are normal. Pupils are equal, round, and reactive to light. No scleral icterus.  NECK: Normal range of motion,  supple, no masses.  Normal thyroid.  SKIN: Skin is warm and dry. No rash noted. Not diaphoretic. No erythema. No pallor. MUSCULOSKELETAL: Normal range of motion. No tenderness.  No cyanosis, clubbing, or edema. NEUROLOGIC: Alert and oriented to person, place, and time. Normal reflexes, muscle tone coordination.  PSYCHIATRIC: Normal mood and affect. Normal behavior. Normal judgment and thought content. CARDIOVASCULAR: Normal heart rate noted, regular rhythm RESPIRATORY: Clear to auscultation bilaterally. Effort and breath sounds normal, no problems with respiration noted. BREASTS: Symmetric in size. No masses, tenderness, skin changes, nipple drainage, or lymphadenopathy bilaterally. Performed in the presence of a chaperone. ABDOMEN: Soft, no distention noted.  No tenderness, rebound or guarding.  PELVIC: Normal appearing external genitalia and urethral meatus; normal appearing vaginal mucosa and cervix.  No abnormal discharge noted.  Pap smear obtained.  Normal uterine size, no other palpable masses, no uterine or adnexal tenderness.  Performed in the presence of a chaperone.   Assessment and Plan:   1. Well woman exam  - Cytology - PAP( [facility])   Will follow up results of pap smear and manage accordingly. Mammogram reviewed.  Routine preventative health maintenance measures emphasized. Please refer to After Visit Summary for other counseling recommendations.    Lillis Nuttle, Artist Pais, [place] for [employer], [place]

## 2020-09-26 LAB — CYTOLOGY - PAP
Comment: NEGATIVE
Diagnosis: NEGATIVE
High risk HPV: NEGATIVE

## 2020-10-23 ENCOUNTER — Other Ambulatory Visit: Payer: Self-pay

## 2020-10-23 ENCOUNTER — Encounter: Payer: Self-pay | Admitting: Family Medicine

## 2020-10-23 ENCOUNTER — Ambulatory Visit: Payer: BC Managed Care – PPO | Admitting: Family Medicine

## 2020-10-23 VITALS — BP 114/83 | HR 97 | Ht 66.0 in | Wt 280.0 lb

## 2020-10-23 DIAGNOSIS — E119 Type 2 diabetes mellitus without complications: Secondary | ICD-10-CM

## 2020-10-23 DIAGNOSIS — R0683 Snoring: Secondary | ICD-10-CM | POA: Insufficient documentation

## 2020-10-23 LAB — POCT GLYCOSYLATED HEMOGLOBIN (HGB A1C): Hemoglobin A1C: 6.8 % — AB (ref 4.0–5.6)

## 2020-10-23 NOTE — Assessment & Plan Note (Signed)
A1c jumped up significantly from 6.2-6.8.  I do think some of it is just dietary endocrine discretion over the last couple of months.  We discussed the option of increasing her Ozempic.  She is doing well on the 0.5 mg dose.  But she really wants to just work on lifestyle changes for now and then follow-up in 3 months and then make a decision about adjusting it if needed.

## 2020-10-23 NOTE — Assessment & Plan Note (Signed)
Stop bang score of 6 today.  Discussed options for evaluation for sleep apnea she would prefer to do home study.

## 2020-10-23 NOTE — Progress Notes (Signed)
Established Patient Office Visit  Subjective:  Patient ID: Kaitlyn Kirby, female    DOB: [birth year]  Age: 50 y.o. MRN: [id number]  CC:  Chief Complaint  Patient presents with   Diabetes   Hypertension    HPI KANITRA PURIFOY presents for   Diabetes - no hypoglycemic events. No wounds or sores that are not healing well. No increased thirst or urination. Checking glucose at home. Taking medications as prescribed without any side effects.  She reports that she just went on a cruise and ever since school got out she really has not been very careful with her food choices her diet she has not really been exercising so she knows her A1c is gone to be elevated today but she says she is really planning on getting back on track.  He also reports fatigue and snoring.  She does have a cousin has been diagnosed with sleep apnea and suspects other family members may have that even though they do not have a formal diagnosis.  She also had to cancel her colonoscopy but would like to reschedule.  Past Medical History:  Diagnosis Date   Abnormal Pap smear 1995   Colpo   Allergy    Asthma    seasonal   Basal cell carcinoma 2010   Depression    DM type 2 (diabetes mellitus, type 2) (HCC)    Hemorrhoids    IBS (irritable bowel syndrome) 8-10 years ago   PONV (postoperative nausea and vomiting)    Rectal fissure     Past Surgical History:  Procedure Laterality Date   EYE SURGERY Right 2017   clogged tear duct   HEMORRHOID SURGERY  06/22/2019   MINOR SPHINCTEROTOMY N/A 02/07/2020   Procedure: ANAL SPHINCTEROTOMY.HEMORRHOIDECTOMY.ANORECTAL EXAMINATION UNDER ANESTHESIA, EXCISION OF ANAL POLYPS;  Surgeon: Michael Boston, MD;  Location: [facility];  Service: General;  Laterality: N/A;   SKIN CANCER EXCISION  2010   chest, basal cell   WISDOM TOOTH EXTRACTION  50 years old    Family History  Problem Relation Age of Onset   Hypertension Mother    Thyroid disease Mother     Hypertension Father    Heart attack Father    Diabetes Brother        type 1     Social History   Socioeconomic History   Marital status: Single    Spouse name: Not on file   Number of children: Not on file   Years of education: Not on file   Highest education level: Not on file  Occupational History   Occupation: teacher  Tobacco Use   Smoking status: Former    Packs/day: 0.50    Types: Cigarettes    Quit date: 11/10/1995    Years since quitting: 24.9   Smokeless tobacco: Never   Tobacco comments:    social teeange yrs  Vaping Use   Vaping Use: Never used  Substance and Sexual Activity   Alcohol use: Yes    Comment: occasional   Drug use: No   Sexual activity: Yes    Partners: Male    Birth control/protection: Condom  Other Topics Concern   Not on file  Social History Narrative   Not on file   Social Determinants of Health   Financial Resource Strain: Not on file  Food Insecurity: Not on file  Transportation Needs: Not on file  Physical Activity: Not on file  Stress: Not on file  Social Connections: Not on file  Intimate Partner Violence: Not on file    Outpatient Medications Prior to Visit  Medication Sig Dispense Refill   albuterol (VENTOLIN HFA) 108 (90 Base) MCG/ACT inhaler Inhale 1 puff into the lungs every 4 (four) hours as needed for wheezing or shortness of breath. 18 each PRN   atorvastatin (LIPITOR) 10 MG tablet Take 1 tablet (10 mg total) by mouth daily. 90 tablet 3   blood glucose meter kit and supplies KIT Dispense based on patient and insurance preference. Use up to four times daily as directed. (FOR ICD-9 250.00, 250.01). 1 each 0   cetirizine (ZYRTEC) 10 MG tablet Take 10 mg by mouth daily.     fluticasone (FLONASE) 50 MCG/ACT nasal spray Place into both nostrils as needed.      OZEMPIC, 0.25 OR 0.5 MG/DOSE, 2 MG/1.5ML SOPN INJECT 0.5 MG INTO THE SKIN ONCE A WEEK. 4.5 mL 10   venlafaxine XR (EFFEXOR-XR) 37.5 MG 24 hr capsule Take 1 capsule  (37.5 mg total) by mouth daily with breakfast. 90 capsule 1   No facility-administered medications prior to visit.    Allergies  Allergen Reactions   Amoxicillin Hives   Celexa [Citalopram Hydrobromide] Other (See Comments)    Fuzzy headed   Penicillins Hives   Fluoxetine Palpitations    ROS Review of Systems    Objective:    Physical Exam Constitutional:      Appearance: Normal appearance. She is well-developed.  HENT:     Head: Normocephalic and atraumatic.  Cardiovascular:     Rate and Rhythm: Normal rate and regular rhythm.     Heart sounds: Normal heart sounds.  Pulmonary:     Effort: Pulmonary effort is normal.     Breath sounds: Normal breath sounds.  Skin:    General: Skin is warm and dry.  Neurological:     Mental Status: She is alert and oriented to person, place, and time.  Psychiatric:        Behavior: Behavior normal.    BP 114/83   Pulse 97   Ht _0  (1.676 m)   Wt 280 lb (127 kg)   SpO2 98%   BMI 45.19 kg/m  Wt Readings from Last 3 Encounters:  10/23/20 280 lb (127 kg)  09/24/20 276 lb (125.2 kg)  05/21/20 273 lb (123.8 kg)     Health Maintenance Due  Topic Date Due   PNEUMOCOCCAL POLYSACCHARIDE VACCINE AGE 75-64 HIGH RISK  Never done   Pneumococcal Vaccine 66-82 Years old (1 - PCV) Never done   URINE MICROALBUMIN  Never done   Zoster Vaccines- Shingrix (1 of 2) Never done   COLONOSCOPY (Pts 45-21yr Insurance coverage will need to be confirmed)  Never done    There are no preventive care reminders to display for this patient.  Lab Results  Component Value Date   TSH 2.80 10/31/2018   Lab Results  Component Value Date   WBC 10.2 02/05/2020   HGB 13.7 02/05/2020   HCT 40.5 02/05/2020   MCV 93.3 02/05/2020   PLT 262 02/05/2020   Lab Results  Component Value Date   NA 132 (L) 02/05/2020   K 4.1 02/05/2020   CO2 25 02/05/2020   GLUCOSE 110 (H) 02/05/2020   BUN 10 02/05/2020   CREATININE 0.86 02/05/2020   BILITOT 0.5  12/29/2019   ALKPHOS 44 11/11/2011   AST 11 12/29/2019   ALT 12 12/29/2019   PROT 6.5 12/29/2019   ALBUMIN 4.2 11/11/2011   CALCIUM 8.7 (L)  02/05/2020   ANIONGAP 6 02/05/2020   Lab Results  Component Value Date   CHOL 192 12/29/2019   Lab Results  Component Value Date   HDL 35 (L) 12/29/2019   Lab Results  Component Value Date   LDLCALC 129 (H) 12/29/2019   Lab Results  Component Value Date   TRIG 163 (H) 12/29/2019   Lab Results  Component Value Date   CHOLHDL 5.5 (H) 12/29/2019   Lab Results  Component Value Date   HGBA1C 6.8 (A) 10/23/2020      Assessment & Plan:   Problem List Items Addressed This Visit       Endocrine   Controlled type 2 diabetes mellitus ([place]) - Primary    A1c jumped up significantly from 6.2-6.8.  I do think some of it is just dietary endocrine discretion over the last couple of months.  We discussed the option of increasing her Ozempic.  She is doing well on the 0.5 mg dose.  But she really wants to just work on lifestyle changes for now and then follow-up in 3 months and then make a decision about adjusting it if needed.       Relevant Orders   POCT glycosylated hemoglobin (Hb A1C) (Completed)     Other   Snoring    Stop bang score of 6 today.  Discussed options for evaluation for sleep apnea she would prefer to do home study.       Relevant Orders   Home sleep test    Just encouraged her to call to reschedule her colonoscopy if she needs a new referral just let us know.  Discussed need for pneumonia vaccine.  She said she just had her COVID booster so we will plan to get the pneumonia vaccine when she follows up in October.  No orders of the defined types were placed in this encounter.   Follow-up: Return in about 3 months (around 01/23/2021) for Diabetes follow-up and Prevnar 20 .    Beatrice Lecher, MD

## 2020-10-25 ENCOUNTER — Other Ambulatory Visit: Payer: Self-pay

## 2020-10-25 DIAGNOSIS — R0683 Snoring: Secondary | ICD-10-CM

## 2021-01-01 ENCOUNTER — Ambulatory Visit (HOSPITAL_BASED_OUTPATIENT_CLINIC_OR_DEPARTMENT_OTHER): Payer: BC Managed Care – PPO | Attending: Family Medicine | Admitting: Internal Medicine

## 2021-01-01 ENCOUNTER — Other Ambulatory Visit: Payer: Self-pay

## 2021-01-01 VITALS — Ht 66.0 in | Wt 275.0 lb

## 2021-01-01 DIAGNOSIS — R0683 Snoring: Secondary | ICD-10-CM | POA: Insufficient documentation

## 2021-01-01 DIAGNOSIS — G4733 Obstructive sleep apnea (adult) (pediatric): Secondary | ICD-10-CM | POA: Diagnosis present

## 2021-01-01 LAB — HM DIABETES EYE EXAM

## 2021-01-09 ENCOUNTER — Encounter: Payer: Self-pay | Admitting: Family Medicine

## 2021-01-12 DIAGNOSIS — R0683 Snoring: Secondary | ICD-10-CM | POA: Diagnosis not present

## 2021-01-12 NOTE — Procedures (Signed)
    Patient Name: Kaitlyn Kirby, Skorupski Date: 01/01/2021 Gender: Female D.O.B: [birth year] Age (years): 26 Referring Provider: Beatrice Lecher Height (inches): 63 Interpreting Physician: Baird Lyons MD, ABSM Weight (lbs): 275 RPSGT: Gerhard Perches BMI: 44 MRN: [id number] Neck Size: <br>  CLINICAL INFORMATION Sleep Study Type: HST Indication for sleep study: Snoring Epworth Sleepiness Score: 10  SLEEP STUDY TECHNIQUE A multi-channel overnight portable sleep study was performed. The channels recorded were: nasal airflow, thoracic respiratory movement, and oxygen saturation with a pulse oximetry. Snoring was also monitored.  MEDICATIONS Patient self administered medications include: none reported.  SLEEP ARCHITECTURE Patient was studied for 365.5 minutes. The sleep efficiency was 100.0 % and the patient was supine for 100%. The arousal index was 0.0 per hour.  RESPIRATORY PARAMETERS The overall AHI was 64.4 per hour, with a central apnea index of 0 per hour. The oxygen nadir was 87% during sleep.  CARDIAC DATA Mean heart rate during sleep was 77.4 bpm.  IMPRESSIONS - Severe obstructive sleep apnea occurred during this study (AHI = 64.4/h). - Oxygen desaturation was noted during this study (Min O2 = 87%). Mean 95% - Patient snored .  DIAGNOSIS - Obstructive Sleep Apnea (G47.33)  RECOMMENDATIONS - Suggest CPAP titration sleep study or autopap. Other options would be based on clinical judgment - Be careful with alcohol, sedatives and other CNS depressants that may worsen sleep apnea and disrupt normal sleep architecture. - Sleep hygiene should be reviewed to assess factors that may improve sleep quality. - Weight management and regular exercise should be initiated or continued.  [Electronically signed] 01/12/2021 10:58 AM  Baird Lyons MD, ABSM Diplomate, American Board of Sleep Medicine   NPI: [id number]                         [facility] [facility]  ELECTRONICALLY SIGNED ON:  01/12/2021, 10:56 AM [place] PH: [phone]   FX: [phone] [place]

## 2021-01-14 ENCOUNTER — Telehealth: Payer: Self-pay | Admitting: Family Medicine

## 2021-01-14 NOTE — Telephone Encounter (Signed)
Please call pt: pls have her schedule an appt to go over her sleep study results. WE can do virtual or in person.

## 2021-01-15 NOTE — Telephone Encounter (Signed)
We can put her on for virtual Monday around 4:00 if she would like to go over the study results before hand. If she would prefer to wait and go over in Nov that is OK too, but I am happy to do a virtual to go over sleep study on Monday afternoon.

## 2021-01-15 NOTE — Telephone Encounter (Signed)
This is a regular 3 month follow up visit. Will she need a separate visit to go over the sleep study results? Dr Madilyn Fireman doesn't have any appropriate slots before her appt date to go over results, her next available would be November 7th or 8th. AM

## 2021-01-15 NOTE — Telephone Encounter (Signed)
Spoke with patient and let her know we can do a virtual around 4 pm on Monday afternoon (01/20/21) if she prefers or at a later date. She said she would think about it and give [place] a call back to schedule or schedule at her upcoming visit. AM

## 2021-01-15 NOTE — Telephone Encounter (Signed)
Is this a f/u appt for her or to go over her sleep study results?

## 2021-01-15 NOTE — Telephone Encounter (Signed)
Patient has a scheduled appt coming up on 01/23/21. AM

## 2021-01-23 ENCOUNTER — Encounter: Payer: Self-pay | Admitting: Family Medicine

## 2021-01-23 ENCOUNTER — Other Ambulatory Visit: Payer: Self-pay

## 2021-01-23 ENCOUNTER — Ambulatory Visit (INDEPENDENT_AMBULATORY_CARE_PROVIDER_SITE_OTHER): Payer: BC Managed Care – PPO | Admitting: Family Medicine

## 2021-01-23 VITALS — BP 120/83 | HR 84 | Ht 66.0 in | Wt 281.0 lb

## 2021-01-23 DIAGNOSIS — Z1211 Encounter for screening for malignant neoplasm of colon: Secondary | ICD-10-CM | POA: Diagnosis not present

## 2021-01-23 DIAGNOSIS — Z23 Encounter for immunization: Secondary | ICD-10-CM | POA: Diagnosis not present

## 2021-01-23 DIAGNOSIS — E119 Type 2 diabetes mellitus without complications: Secondary | ICD-10-CM | POA: Diagnosis not present

## 2021-01-23 DIAGNOSIS — L723 Sebaceous cyst: Secondary | ICD-10-CM

## 2021-01-23 DIAGNOSIS — G4733 Obstructive sleep apnea (adult) (pediatric): Secondary | ICD-10-CM

## 2021-01-23 LAB — POCT GLYCOSYLATED HEMOGLOBIN (HGB A1C): Hemoglobin A1C: 6.7 % — AB (ref 4.0–5.6)

## 2021-01-23 MED ORDER — SEMAGLUTIDE (1 MG/DOSE) 4 MG/3ML ~~LOC~~ SOPN: 1.0000 mg | PEN_INJECTOR | SUBCUTANEOUS | 0 refills | Status: DC

## 2021-01-23 MED ORDER — AMBULATORY NON FORMULARY MEDICATION: 99 refills | Status: DC

## 2021-01-23 NOTE — Assessment & Plan Note (Signed)
1C looks stable at 6.7 today.  Like to go ahead and try to increase the Ozempic to 1 mg.  We will see if she tolerates it if so please follow-up in 3 months.

## 2021-01-23 NOTE — Assessment & Plan Note (Signed)
Sleep study performed 01/01/2021: Severe OSA.  AHI = 64.4/h  Reviewed sleep study results with her today.  She does have severe obstructive sleep apnea we discussed appropriate treatment.  And the potential medical effects of not treating it.  She is okay with a trial of CPAP.  We will send to DME supplier and will need to follow up in 1 month after she gets her supplies and starts using it regularly.

## 2021-01-23 NOTE — Progress Notes (Signed)
Established Patient Office Visit  Subjective:  Patient ID: Kaitlyn Kirby, female    DOB: Apr 23, 1970  Age: 50 y.o. MRN: [id number]  CC:  Chief Complaint  Patient presents with   Diabetes    HPI Kaitlyn Kirby presents for   Diabetes - no hypoglycemic events. No wounds or sores that are not healing well. No increased thirst or urination. Checking glucose at home. Taking medications as prescribed without any side effects.  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  She has been trying to get back in with [facility] for counseling and did get in with her at least once.  She is also here to review her obstructive sleep apnea results.  She also had a bump on her back that got very big overnight and ended up seeing the dermatologist and having it drained.  It filled back up again because it closed up too early and she had to go back and have it drained.  But they have not actually removed to the area   Past Medical History:  Diagnosis Date   Abnormal Pap smear 1995   Colpo   Allergy    Asthma    seasonal   Basal cell carcinoma 2010   Depression    DM type 2 (diabetes mellitus, type 2) ([place])    Hemorrhoids    IBS (irritable bowel syndrome) 8-10 years ago   PONV (postoperative nausea and vomiting)    Rectal fissure     Past Surgical History:  Procedure Laterality Date   EYE SURGERY Right 2017   clogged tear duct   HEMORRHOID SURGERY  06/22/2019   MINOR SPHINCTEROTOMY N/A 02/07/2020   Procedure: ANAL SPHINCTEROTOMY.HEMORRHOIDECTOMY.ANORECTAL EXAMINATION UNDER ANESTHESIA, EXCISION OF ANAL POLYPS;  Surgeon: Michael Boston, MD;  Location: [place];  Service: General;  Laterality: N/A;   SKIN CANCER EXCISION  2010   chest, basal cell   WISDOM TOOTH EXTRACTION  50  years old    Family History  Problem Relation Age of Onset   Hypertension Mother    Thyroid disease Mother    Hypertension Father    Heart attack Father    Diabetes Brother        type 1     Social History   Socioeconomic History   Marital status: Single    Spouse name: Not on file   Number of children: Not on file   Years of education: Not on file   Highest education level: Not on file  Occupational History   Occupation: teacher  Tobacco Use   Smoking status: Former    Packs/day: 0.50    Types: Cigarettes    Quit date: 11/10/1995    Years since quitting: 25.2   Smokeless tobacco: Never   Tobacco comments:    social teeange yrs  Vaping Use   Vaping Use: Never used  Substance and Sexual Activity   Alcohol use: Yes    Comment: occasional   Drug use: No   Sexual activity: Yes    Partners: Male    Birth control/protection: Condom  Other Topics Concern   Not on file  Social History Narrative   Not on file   Social Determinants of Health   Financial Resource Strain: Not on file  Food Insecurity:  Not on file  Transportation Needs: Not on file  Physical Activity: Not on file  Stress: Not on file  Social Connections: Not on file  Intimate Partner Violence: Not on file    Outpatient Medications Prior to Visit  Medication Sig Dispense Refill   albuterol (VENTOLIN HFA) 108 (90 Base) MCG/ACT inhaler Inhale 1 puff into the lungs every 4 (four) hours as needed for wheezing or shortness of breath. 18 each PRN   atorvastatin (LIPITOR) 10 MG tablet Take 1 tablet (10 mg total) by mouth daily. 90 tablet 3   blood glucose meter kit and supplies KIT Dispense based on patient and insurance preference. Use up to four times daily as directed. (FOR ICD-9 250.00, 250.01). 1 each 0   cetirizine (ZYRTEC) 10 MG tablet Take 10 mg by mouth daily.     fluticasone (FLONASE) 50 MCG/ACT nasal spray Place into both nostrils as needed.      venlafaxine XR (EFFEXOR-XR) 37.5 MG 24 hr capsule  Take 1 capsule (37.5 mg total) by mouth daily with breakfast. 90 capsule 1   OZEMPIC, 0.25 OR 0.5 MG/DOSE, 2 MG/1.5ML SOPN INJECT 0.5 MG INTO THE SKIN ONCE A WEEK. 4.5 mL 10   No facility-administered medications prior to visit.    Allergies  Allergen Reactions   Amoxicillin Hives   Celexa [Citalopram Hydrobromide] Other (See Comments)    Fuzzy headed   Penicillins Hives   Fluoxetine Palpitations    ROS Review of Systems    Objective:    Physical Exam Constitutional:      Appearance: Normal appearance. She is well-developed.  HENT:     Head: Normocephalic and atraumatic.  Cardiovascular:     Rate and Rhythm: Normal rate and regular rhythm.     Heart sounds: Normal heart sounds.  Pulmonary:     Effort: Pulmonary effort is normal.     Breath sounds: Normal breath sounds.  Skin:    General: Skin is warm and dry.  Neurological:     Mental Status: She is alert and oriented to person, place, and time.  Psychiatric:        Behavior: Behavior normal.    BP 120/83   Pulse 84   Ht 5\' 6"  (1.676 m)   Wt 281 lb (127.5 kg)   SpO2 100%   BMI 45.35 kg/m  Wt Readings from Last 3 Encounters:  01/23/21 281 lb (127.5 kg)  01/01/21 275 lb (124.7 kg)  10/23/20 280 lb (127 kg)     Health Maintenance Due  Topic Date Due   Pneumococcal Vaccine 11-34 Years old (1 - PCV) Never done   URINE MICROALBUMIN  Never done   Zoster Vaccines- Shingrix (1 of 2) Never done   COLONOSCOPY (Pts 45-23yrs Insurance coverage will need to be confirmed)  Never done   COVID-19 Vaccine (4 - Booster) 11/06/2020    There are no preventive care reminders to display for this patient.  Lab Results  Component Value Date   TSH 2.80 10/31/2018   Lab Results  Component Value Date   WBC 10.2 02/05/2020   HGB 13.7 02/05/2020   HCT 40.5 02/05/2020   MCV 93.3 02/05/2020   PLT 262 02/05/2020   Lab Results  Component Value Date   NA 132 (L) 02/05/2020   K 4.1 02/05/2020   CO2 25 02/05/2020    GLUCOSE 110 (H) 02/05/2020   BUN 10 02/05/2020   CREATININE 0.86 02/05/2020   BILITOT 0.5 12/29/2019   ALKPHOS 44 11/11/2011  AST 11 12/29/2019   ALT 12 12/29/2019   PROT 6.5 12/29/2019   ALBUMIN 4.2 11/11/2011   CALCIUM 8.7 (L) 02/05/2020   ANIONGAP 6 02/05/2020   Lab Results  Component Value Date   CHOL 192 12/29/2019   Lab Results  Component Value Date   HDL 35 (L) 12/29/2019   Lab Results  Component Value Date   LDLCALC 129 (H) 12/29/2019   Lab Results  Component Value Date   TRIG 163 (H) 12/29/2019   Lab Results  Component Value Date   CHOLHDL 5.5 (H) 12/29/2019   Lab Results  Component Value Date   HGBA1C 6.7 (A) 01/23/2021      Assessment & Plan:   Problem List Items Addressed This Visit       Respiratory   OSA (obstructive sleep apnea)    Sleep study performed 01/01/2021: Severe OSA.  AHI = 64.4/h  Reviewed sleep study results with her today.  She does have severe obstructive sleep apnea we discussed appropriate treatment.  And the potential medical effects of not treating it.  She is okay with a trial of CPAP.  We will send to DME supplier and will need to follow up in 1 month after she gets her supplies and starts using it regularly.      Relevant Medications   AMBULATORY NON FORMULARY MEDICATION     Endocrine   Controlled type 2 diabetes mellitus (HCC) - Primary    1C looks stable at 6.7 today.  Like to go ahead and try to increase the Ozempic to 1 mg.  We will see if she tolerates it if so please follow-up in 3 months.      Relevant Medications   Semaglutide, 1 MG/DOSE, 4 MG/3ML SOPN   Other Relevant Orders   POCT glycosylated hemoglobin (Hb A1C) (Completed)   Lipid Panel w/reflex Direct LDL   COMPLETE METABOLIC PANEL WITH GFR   CBC   Urine Microalbumin w/creat. ratio   Other Visit Diagnoses     Screening for colon cancer       Relevant Orders   Ambulatory referral to Gastroenterology   Need for immunization against influenza        Relevant Orders   Flu Vaccine QUAD 18mo+IM (Fluarix, Fluzone & Alfiuria Quad PF) (Completed)   Sebaceous cyst           I think the lesion on her mid upper back is most consistent with a sebaceous cyst.  I still feel little fullness underneath the previous incision.  I think it will probably need to be excised but certainly she could give it another week or 2 to just see if it continues to heal.  Meds ordered this encounter  Medications   Semaglutide, 1 MG/DOSE, 4 MG/3ML SOPN    Sig: Inject 1 mg as directed once a week.    Dispense:  9 mL    Refill:  0   AMBULATORY NON FORMULARY MEDICATION    Sig: Medication Name: CPAP machine with humidifier and supplies.  Diagnosis severe obstructive sleep apnea with an AHI of 64.  Set to AutoPap 5 to 18 cm of water pressure. Fax to [facility]    Dispense:  1 Units    Refill:  PRN     Follow-up: Return in about 13 weeks (around 04/24/2021) for Diabetes follow-up.   I spent 42 minutes on the day of the encounter to include pre-visit record review, face-to-face time with the patient and post visit ordering of test.  Beatrice Lecher, MD

## 2021-01-29 ENCOUNTER — Telehealth: Payer: Self-pay | Admitting: *Deleted

## 2021-01-29 NOTE — Telephone Encounter (Signed)
CPAP order faxed to [facility].

## 2021-01-29 NOTE — Telephone Encounter (Signed)
Confirmation received.

## 2021-02-02 ENCOUNTER — Other Ambulatory Visit: Payer: Self-pay | Admitting: Family Medicine

## 2021-02-02 DIAGNOSIS — F3341 Major depressive disorder, recurrent, in partial remission: Secondary | ICD-10-CM

## 2021-02-10 ENCOUNTER — Encounter: Payer: Self-pay | Admitting: Family Medicine

## 2021-02-11 ENCOUNTER — Other Ambulatory Visit: Payer: Self-pay | Admitting: *Deleted

## 2021-02-11 DIAGNOSIS — G4733 Obstructive sleep apnea (adult) (pediatric): Secondary | ICD-10-CM

## 2021-02-11 MED ORDER — AMBULATORY NON FORMULARY MEDICATION: 99 refills | Status: DC

## 2021-02-13 LAB — CBC
HCT: 43.3 % (ref 35.0–45.0)
Hemoglobin: 14.8 g/dL (ref 11.7–15.5)
MCH: 32.7 pg (ref 27.0–33.0)
MCHC: 34.2 g/dL (ref 32.0–36.0)
MCV: 95.6 fL (ref 80.0–100.0)
MPV: 10.6 fL (ref 7.5–12.5)
Platelets: 256 10*3/uL (ref 140–400)
RBC: 4.53 10*6/uL (ref 3.80–5.10)
RDW: 13.1 % (ref 11.0–15.0)
WBC: 9.4 10*3/uL (ref 3.8–10.8)

## 2021-02-13 LAB — COMPLETE METABOLIC PANEL WITH GFR
AG Ratio: 1.6 (calc) (ref 1.0–2.5)
ALT: 13 U/L (ref 6–29)
AST: 12 U/L (ref 10–35)
Albumin: 4.1 g/dL (ref 3.6–5.1)
Alkaline phosphatase (APISO): 72 U/L (ref 37–153)
BUN: 13 mg/dL (ref 7–25)
CO2: 28 mmol/L (ref 20–32)
Calcium: 9.5 mg/dL (ref 8.6–10.4)
Chloride: 99 mmol/L (ref 98–110)
Creat: 0.8 mg/dL (ref 0.50–1.03)
Globulin: 2.6 g/dL (calc) (ref 1.9–3.7)
Glucose, Bld: 151 mg/dL — ABNORMAL HIGH (ref 65–99)
Potassium: 4.3 mmol/L (ref 3.5–5.3)
Sodium: 134 mmol/L — ABNORMAL LOW (ref 135–146)
Total Bilirubin: 0.9 mg/dL (ref 0.2–1.2)
Total Protein: 6.7 g/dL (ref 6.1–8.1)
eGFR: 89 mL/min/{1.73_m2} (ref >=60)

## 2021-02-13 LAB — LIPID PANEL W/REFLEX DIRECT LDL
Cholesterol: 152 mg/dL (ref <200)
HDL: 38 mg/dL — ABNORMAL LOW (ref >=50)
LDL Cholesterol (Calc): 90 mg/dL (calc)
Non-HDL Cholesterol (Calc): 114 mg/dL (calc) (ref <130)
Total CHOL/HDL Ratio: 4 (calc) (ref <5.0)
Triglycerides: 139 mg/dL (ref <150)

## 2021-02-13 LAB — MICROALBUMIN / CREATININE URINE RATIO
Creatinine, Urine: 235 mg/dL (ref 20–275)
Microalb Creat Ratio: 4 mcg/mg creat (ref <30)
Microalb, Ur: 0.9 mg/dL

## 2021-02-13 NOTE — Progress Notes (Signed)
Hi Kaitlyn Kirby, LDL looks fantastic!  Great work.  Down to most 40 points which is fantastic.  Your metabolic panel looks great as well sodium looks a little better.  Only one-point off this time.  Liver is happy.  Blood count is normal.  No excess protein in the urine.

## 2021-03-04 ENCOUNTER — Ambulatory Visit: Payer: BC Managed Care – PPO | Admitting: Sports Medicine

## 2021-04-15 ENCOUNTER — Ambulatory Visit: Payer: BC Managed Care – PPO | Admitting: Family Medicine

## 2021-04-15 ENCOUNTER — Other Ambulatory Visit: Payer: Self-pay

## 2021-04-15 ENCOUNTER — Telehealth: Payer: Self-pay

## 2021-04-15 ENCOUNTER — Encounter: Payer: Self-pay | Admitting: Family Medicine

## 2021-04-15 VITALS — BP 112/76 | HR 85 | Resp 16 | Ht 66.0 in | Wt 285.0 lb

## 2021-04-15 DIAGNOSIS — G4733 Obstructive sleep apnea (adult) (pediatric): Secondary | ICD-10-CM | POA: Diagnosis not present

## 2021-04-15 DIAGNOSIS — Z7689 Persons encountering health services in other specified circumstances: Secondary | ICD-10-CM

## 2021-04-15 DIAGNOSIS — E119 Type 2 diabetes mellitus without complications: Secondary | ICD-10-CM

## 2021-04-15 DIAGNOSIS — Z1211 Encounter for screening for malignant neoplasm of colon: Secondary | ICD-10-CM | POA: Diagnosis not present

## 2021-04-15 LAB — POCT GLYCOSYLATED HEMOGLOBIN (HGB A1C): Hemoglobin A1C: 7.1 % — AB (ref 4.0–5.6)

## 2021-04-15 MED ORDER — SEMAGLUTIDE (2 MG/DOSE) 8 MG/3ML ~~LOC~~ SOPN: 2.0000 mg | PEN_INJECTOR | SUBCUTANEOUS | 0 refills | Status: DC

## 2021-04-15 MED ORDER — AMBULATORY NON FORMULARY MEDICATION: 99 refills | Status: DC

## 2021-04-15 NOTE — Assessment & Plan Note (Signed)
Fortunately were still having significant difficulty getting her CPAP machine for her sleep apnea.  We have already faxed it to 2 companies and have not had great success.  Work on a try again.  Information was given to Levada Dy, our triage nurse so that we can try to get in touch with the company.  Unfortunately a lot of the machines are also on backorder so that has made it difficult as well.  Being that she has more severe sleep apnea I think it is absolutely critical to improving her overall health, getting better control of her diabetes and helping her be more successful with weight loss.

## 2021-04-15 NOTE — Progress Notes (Signed)
Established Patient Office Visit  Subjective:  Patient ID: Kaitlyn Kirby, female    DOB: Aug 03, 1970  Age: 51 y.o. MRN: [id number]  CC:  Chief Complaint  Patient presents with   Diabetes    Follow up     HPI Kaitlyn Kirby presents for  3 mo f/u   Diabetes - no hypoglycemic events. No wounds or sores that are not healing well. No increased thirst or urination. Checking glucose at home. Taking medications as prescribed without any side effects.  F/U stress -   F/U OSA - New start CPAP.    Past Medical History:  Diagnosis Date   Abnormal Pap smear 1995   Colpo   Allergy    Asthma    seasonal   Basal cell carcinoma 2010   Depression    DM type 2 (diabetes mellitus, type 2) (HCC)    Hemorrhoids    IBS (irritable bowel syndrome) 8-10 years ago   PONV (postoperative nausea and vomiting)    Rectal fissure     Past Surgical History:  Procedure Laterality Date   EYE SURGERY Right 2017   clogged tear duct   HEMORRHOID SURGERY  06/22/2019   MINOR SPHINCTEROTOMY N/A 02/07/2020   Procedure: ANAL SPHINCTEROTOMY.HEMORRHOIDECTOMY.ANORECTAL EXAMINATION UNDER ANESTHESIA, EXCISION OF ANAL POLYPS;  Surgeon: Michael Boston, MD;  Location: [facility];  Service: General;  Laterality: N/A;   SKIN CANCER EXCISION  2010   chest, basal cell   WISDOM TOOTH EXTRACTION  51 years old    Family History  Problem Relation Age of Onset   Hypertension Mother    Thyroid disease Mother    Hypertension Father    Heart attack Father    Diabetes Brother        type 1     Social History   Socioeconomic History   Marital status: Single    Spouse name: Not on file   Number of children: Not on file   Years of education: Not on file   Highest education level: Not on file  Occupational History   Occupation: teacher  Tobacco Use   Smoking status: Former    Packs/day: 0.50    Types: Cigarettes    Quit date: 11/10/1995    Years since quitting: 25.4   Smokeless tobacco: Never    Tobacco comments:    social teeange yrs  Vaping Use   Vaping Use: Never used  Substance and Sexual Activity   Alcohol use: Yes    Comment: occasional   Drug use: No   Sexual activity: Yes    Partners: Male    Birth control/protection: Condom  Other Topics Concern   Not on file  Social History Narrative   Not on file   Social Determinants of Health   Financial Resource Strain: Not on file  Food Insecurity: Not on file  Transportation Needs: Not on file  Physical Activity: Not on file  Stress: Not on file  Social Connections: Not on file  Intimate Partner Violence: Not on file    Outpatient Medications Prior to Visit  Medication Sig Dispense Refill   albuterol (VENTOLIN HFA) 108 (90 Base) MCG/ACT inhaler Inhale 1 puff into the lungs every 4 (four) hours as needed for wheezing or shortness of breath. 18 each PRN   atorvastatin (LIPITOR) 10 MG tablet Take 1 tablet (10 mg total) by mouth daily. 90 tablet 3   blood glucose meter kit and supplies KIT Dispense based on patient and insurance preference. Use  up to four times daily as directed. (FOR ICD-9 250.00, 250.01). 1 each 0   cetirizine (ZYRTEC) 10 MG tablet Take 10 mg by mouth daily.     fluticasone (FLONASE) 50 MCG/ACT nasal spray Place into both nostrils as needed.      venlafaxine XR (EFFEXOR-XR) 37.5 MG 24 hr capsule TAKE 1 CAPSULE BY MOUTH DAILY WITH BREAKFAST. 90 capsule 1   AMBULATORY NON FORMULARY MEDICATION Medication Name: CPAP machine with humidifier and supplies.  Diagnosis severe obstructive sleep apnea with an AHI of 64.  Set to AutoPap 5 to 18 cm of water pressure. Fax to [facility] 1 Units PRN   Semaglutide, 1 MG/DOSE, 4 MG/3ML SOPN Inject 1 mg as directed once a week. 9 mL 0   No facility-administered medications prior to visit.    Allergies  Allergen Reactions   Amoxicillin Hives   Celexa [Citalopram Hydrobromide] Other (See Comments)    Fuzzy headed   Penicillins Hives   Fluoxetine Palpitations     ROS Review of Systems    Objective:    Physical Exam Constitutional:      Appearance: Normal appearance. She is well-developed.  HENT:     Head: Normocephalic and atraumatic.  Cardiovascular:     Rate and Rhythm: Normal rate and regular rhythm.     Heart sounds: Normal heart sounds.  Pulmonary:     Effort: Pulmonary effort is normal.     Breath sounds: Normal breath sounds.  Skin:    General: Skin is warm and dry.  Neurological:     Mental Status: She is alert and oriented to person, place, and time.  Psychiatric:        Behavior: Behavior normal.    BP 112/76    Pulse 85    Resp 16    Ht 5' 6"  (1.676 m)    BMI 45.35 kg/m  Wt Readings from Last 3 Encounters:  01/23/21 281 lb (127.5 kg)  01/01/21 275 lb (124.7 kg)  10/23/20 280 lb (127 kg)     Health Maintenance Due  Topic Date Due   Pneumococcal Vaccine 62-72 Years old (1 - PCV) Never done   Zoster Vaccines- Shingrix (1 of 2) Never done   COLONOSCOPY (Pts 45-30yr Insurance coverage will need to be confirmed)  Never done   COVID-19 Vaccine (4 - Booster) 11/06/2020    There are no preventive care reminders to display for this patient.  Lab Results  Component Value Date   TSH 2.80 10/31/2018   Lab Results  Component Value Date   WBC 9.4 02/12/2021   HGB 14.8 02/12/2021   HCT 43.3 02/12/2021   MCV 95.6 02/12/2021   PLT 256 02/12/2021   Lab Results  Component Value Date   NA 134 (L) 02/12/2021   K 4.3 02/12/2021   CO2 28 02/12/2021   GLUCOSE 151 (H) 02/12/2021   BUN 13 02/12/2021   CREATININE 0.80 02/12/2021   BILITOT 0.9 02/12/2021   ALKPHOS 44 11/11/2011   AST 12 02/12/2021   ALT 13 02/12/2021   PROT 6.7 02/12/2021   ALBUMIN 4.2 11/11/2011   CALCIUM 9.5 02/12/2021   ANIONGAP 6 02/05/2020   EGFR 89 02/12/2021   Lab Results  Component Value Date   CHOL 152 02/12/2021   Lab Results  Component Value Date   HDL 38 (L) 02/12/2021   Lab Results  Component Value Date   LDLCALC 90  02/12/2021   Lab Results  Component Value Date   TRIG 139 02/12/2021  Lab Results  Component Value Date   CHOLHDL 4.0 02/12/2021   Lab Results  Component Value Date   HGBA1C 6.7 (A) 01/23/2021      Assessment & Plan:   Problem List Items Addressed This Visit       Respiratory   OSA (obstructive sleep apnea)    Fortunately were still having significant difficulty getting her CPAP machine for her sleep apnea.  We have already faxed it to 2 companies and have not had great success.  Work on a try again.  Information was given to Levada Dy, our triage nurse so that we can try to get in touch with the company.  Unfortunately a lot of the machines are also on backorder so that has made it difficult as well.  Being that she has more severe sleep apnea I think it is absolutely critical to improving her overall health, getting better control of her diabetes and helping her be more successful with weight loss.      Relevant Medications   AMBULATORY NON FORMULARY MEDICATION     Endocrine   Controlled type 2 diabetes mellitus (HCC) - Primary    A1c did go up today.  We discussed options including increasing Ozempic to 2 mg.  New prescription sent to pharmacy if she has any problems or concerns please let me know.  Otherwise plan to follow back up in about 3 months. Lab Results  Component Value Date   HGBA1C 6.7 (A) 01/23/2021         Relevant Medications   Semaglutide, 2 MG/DOSE, 8 MG/3ML SOPN   Other Relevant Orders   POCT HgB A1C     Other   Encounter for weight management    Discussed referral to more comprehensive program through [place] or [place].  And a staff that is well equipped to assist her.  She is okay with referral and would like to move forward with it.  She admits she struggled a little bit over the holidays but is also trying to get back on track.      Relevant Orders   Amb Ref to Medical Weight Management   Other Visit Diagnoses     Screening for colon  cancer       Relevant Orders   Ambulatory referral to Gastroenterology       She is also overdue for repeat colonoscopy with place referral in October but she never heard from anyone so we will see if we can get her scheduled at a different provider's office.  Referral placed for digestive health specialists.  Meds ordered this encounter  Medications   AMBULATORY NON FORMULARY MEDICATION    Sig: Medication Name: CPAP machine with humidifier and supplies.  Diagnosis severe obstructive sleep apnea with an AHI of 64.  Set to AutoPap 5 to 18 cm of water pressure. Fax to [facility]    Dispense:  1 Units    Refill:  PRN   DISCONTD: Semaglutide, 2 MG/DOSE, 8 MG/3ML SOPN    Sig: Inject 2 mg as directed once a week.    Dispense:  9 mL    Refill:  0   Semaglutide, 2 MG/DOSE, 8 MG/3ML SOPN    Sig: Inject 2 mg as directed once a week.    Dispense:  9 mL    Refill:  0    Follow-up: Return in about 3 months (around 07/14/2021) for Diabetes follow-up.    Beatrice Lecher, MD

## 2021-04-15 NOTE — Telephone Encounter (Signed)
I reached out to [employer] and they state they are needing CMN. I told them I do not know what a CMN is and could they explain. They were unable to explain. She gave me another fax number to fax the orders and office notes.   Fax [phone]  Phone [phone]  All information has been faxed.

## 2021-04-15 NOTE — Assessment & Plan Note (Signed)
Discussed referral to more comprehensive program through [place] or [place].  And a staff that is well equipped to assist her.  She is okay with referral and would like to move forward with it.  She admits she struggled a little bit over the holidays but is also trying to get back on track.

## 2021-04-15 NOTE — Assessment & Plan Note (Signed)
A1c did go up today.  We discussed options including increasing Ozempic to 2 mg.  New prescription sent to pharmacy if she has any problems or concerns please let me know.  Otherwise plan to follow back up in about 3 months. Lab Results  Component Value Date   HGBA1C 6.7 (A) 01/23/2021

## 2021-04-16 NOTE — Telephone Encounter (Signed)
I spoke with [employer]. They did receive the new faxed information. They will process and get approval from the insurance company. It will take a few days. I will follow back up next week.

## 2021-04-17 ENCOUNTER — Encounter: Payer: Self-pay | Admitting: Family Medicine

## 2021-04-17 NOTE — Telephone Encounter (Signed)
Called [employer] and left a message requesting an update on the CPAP orders. I left my direct call back number.

## 2021-04-23 NOTE — Telephone Encounter (Signed)
Left message with patient for a return call.

## 2021-04-24 ENCOUNTER — Other Ambulatory Visit: Payer: Self-pay

## 2021-04-24 ENCOUNTER — Ambulatory Visit: Payer: BC Managed Care – PPO | Admitting: Family Medicine

## 2021-04-24 ENCOUNTER — Encounter: Payer: Self-pay | Admitting: Family Medicine

## 2021-04-24 VITALS — BP 137/84 | HR 82 | Resp 18 | Ht 66.0 in | Wt 283.0 lb

## 2021-04-24 DIAGNOSIS — R062 Wheezing: Secondary | ICD-10-CM

## 2021-04-24 DIAGNOSIS — J329 Chronic sinusitis, unspecified: Secondary | ICD-10-CM | POA: Diagnosis not present

## 2021-04-24 DIAGNOSIS — J40 Bronchitis, not specified as acute or chronic: Secondary | ICD-10-CM

## 2021-04-24 MED ORDER — AZITHROMYCIN 250 MG PO TABS: ORAL_TABLET | ORAL | 0 refills | Status: AC

## 2021-04-24 NOTE — Progress Notes (Signed)
Acute Office Visit  Subjective:    Patient ID: Kaitlyn Kirby, female    DOB: 1970/08/29, 51 y.o.   MRN: [id number]  Chief Complaint  Patient presents with   cough    Dry cough, productive cough only when taking Mucinex, chest/nasal congestion. Facial pressure/pain. 7 days     HPI Patient is in today for nasal congestion and sinus pressure x 7 days.  + chest congestion, + facial pain. Mucinex helps.  Ears feel full.  Neg   COVID test this weekend.  No fever.  But she feels like the congestion is getting worse she is hearing a lot of wheezing.    Past Medical History:  Diagnosis Date   Abnormal Pap smear 1995   [place]   Allergy    Asthma    seasonal   Basal cell carcinoma 2010   Depression    DM type 2 (diabetes mellitus, type 2) (HCC)    Hemorrhoids    IBS (irritable bowel syndrome) 8-10 years ago   PONV (postoperative nausea and vomiting)    Rectal fissure     Past Surgical History:  Procedure Laterality Date   EYE SURGERY Right 2017   clogged tear duct   HEMORRHOID SURGERY  06/22/2019   MINOR SPHINCTEROTOMY N/A 02/07/2020   Procedure: ANAL SPHINCTEROTOMY.HEMORRHOIDECTOMY.ANORECTAL EXAMINATION UNDER ANESTHESIA, EXCISION OF ANAL POLYPS;  Surgeon: Michael Boston, MD;  Location: [facility];  Service: General;  Laterality: N/A;   SKIN CANCER EXCISION  2010   chest, basal cell   WISDOM TOOTH EXTRACTION  51 years old    Family History  Problem Relation Age of Onset   Hypertension Mother    Thyroid disease Mother    Hypertension Father    Heart attack Father    Diabetes Brother        type 1     Social History   Socioeconomic History   Marital status: Single    Spouse name: Not on file   Number of children: Not on file   Years of education: Not on file   Highest education level: Not on file  Occupational History   Occupation: teacher  Tobacco Use   Smoking status: Former    Packs/day: 0.50    Types: Cigarettes    Quit date: 11/10/1995     Years since quitting: 25.4   Smokeless tobacco: Never   Tobacco comments:    social teeange yrs  Vaping Use   Vaping Use: Never used  Substance and Sexual Activity   Alcohol use: Yes    Comment: occasional   Drug use: No   Sexual activity: Yes    Partners: Male    Birth control/protection: Condom  Other Topics Concern   Not on file  Social History Narrative   Not on file   Social Determinants of Health   Financial Resource Strain: Not on file  Food Insecurity: Not on file  Transportation Needs: Not on file  Physical Activity: Not on file  Stress: Not on file  Social Connections: Not on file  Intimate Partner Violence: Not on file    Outpatient Medications Prior to Visit  Medication Sig Dispense Refill   albuterol (VENTOLIN HFA) 108 (90 Base) MCG/ACT inhaler Inhale 1 puff into the lungs every 4 (four) hours as needed for wheezing or shortness of breath. 18 each PRN   AMBULATORY NON FORMULARY MEDICATION Medication Name: CPAP machine with humidifier and supplies.  Diagnosis severe obstructive sleep apnea with an AHI of 64.  Set to AutoPap 5 to 18 cm of water pressure. Fax to Adept 1 Units PRN   atorvastatin (LIPITOR) 10 MG tablet Take 1 tablet (10 mg total) by mouth daily. 90 tablet 3   blood glucose meter kit and supplies KIT Dispense based on patient and insurance preference. Use up to four times daily as directed. (FOR ICD-9 250.00, 250.01). 1 each 0   cetirizine (ZYRTEC) 10 MG tablet Take 10 mg by mouth daily.     fluticasone (FLONASE) 50 MCG/ACT nasal spray Place into both nostrils as needed.      Semaglutide, 2 MG/DOSE, 8 MG/3ML SOPN Inject 2 mg as directed once a week. 9 mL 0   venlafaxine XR (EFFEXOR-XR) 37.5 MG 24 hr capsule TAKE 1 CAPSULE BY MOUTH DAILY WITH BREAKFAST. 90 capsule 1   No facility-administered medications prior to visit.    Allergies  Allergen Reactions   Amoxicillin Hives   Celexa [Citalopram Hydrobromide] Other (See Comments)    Fuzzy headed    Penicillins Hives   Fluoxetine Palpitations    Review of Systems     Objective:    Physical Exam Constitutional:      Appearance: She is well-developed.  HENT:     Head: Normocephalic and atraumatic.     Right Ear: External ear normal.     Left Ear: External ear normal.     Nose: Nose normal.  Eyes:     Conjunctiva/sclera: Conjunctivae normal.     Pupils: Pupils are equal, round, and reactive to light.  Neck:     Thyroid: No thyromegaly.  Cardiovascular:     Rate and Rhythm: Normal rate and regular rhythm.     Heart sounds: Normal heart sounds.  Pulmonary:     Effort: Pulmonary effort is normal.     Breath sounds: Wheezing present.     Comments: Diffuse expiratory wheezing. Musculoskeletal:     Cervical back: Neck supple.  Lymphadenopathy:     Cervical: No cervical adenopathy.  Skin:    General: Skin is warm and dry.  Neurological:     Mental Status: She is alert and oriented to person, place, and time.    BP 137/84    Pulse 82    Resp 18    Ht _0  (1.676 m)    Wt 283 lb (128.4 kg)    SpO2 98%    BMI 45.68 kg/m  Wt Readings from Last 3 Encounters:  04/24/21 283 lb (128.4 kg)  04/15/21 285 lb (129.3 kg)  01/23/21 281 lb (127.5 kg)    Health Maintenance Due  Topic Date Due   COLONOSCOPY (Pts 45-50yr Insurance coverage will need to be confirmed)  Never done   COVID-19 Vaccine (4 - Booster) 11/06/2020    There are no preventive care reminders to display for this patient.   Lab Results  Component Value Date   TSH 2.80 10/31/2018   Lab Results  Component Value Date   WBC 9.4 02/12/2021   HGB 14.8 02/12/2021   HCT 43.3 02/12/2021   MCV 95.6 02/12/2021   PLT 256 02/12/2021   Lab Results  Component Value Date   NA 134 (L) 02/12/2021   K 4.3 02/12/2021   CO2 28 02/12/2021   GLUCOSE 151 (H) 02/12/2021   BUN 13 02/12/2021   CREATININE 0.80 02/12/2021   BILITOT 0.9 02/12/2021   ALKPHOS 44 11/11/2011   AST 12 02/12/2021   ALT 13 02/12/2021   PROT  6.7 02/12/2021   ALBUMIN 4.2 11/11/2011  CALCIUM 9.5 02/12/2021   ANIONGAP 6 02/05/2020   EGFR 89 02/12/2021   Lab Results  Component Value Date   CHOL 152 02/12/2021   Lab Results  Component Value Date   HDL 38 (L) 02/12/2021   Lab Results  Component Value Date   LDLCALC 90 02/12/2021   Lab Results  Component Value Date   TRIG 139 02/12/2021   Lab Results  Component Value Date   CHOLHDL 4.0 02/12/2021   Lab Results  Component Value Date   HGBA1C 7.1 (A) 04/15/2021       Assessment & Plan:   Problem List Items Addressed This Visit   None Visit Diagnoses     Sinobronchitis    -  Primary   Relevant Medications   azithromycin (ZITHROMAX) 250 MG tablet   Wheezing          Sinobronchitis -we will treat with azithromycin.  She has significant wheezing on exam so if that does not improve then please let us know.  Continue with hydration moisturizing okay to continue with Mucinex and okay to use over-the-counter cough and cold medication.  Meds ordered this encounter  Medications   azithromycin (ZITHROMAX) 250 MG tablet    Sig: 2 Ttabs PO on Day 1, then one a day x 4 days.    Dispense:  6 tablet    Refill:  0     Beatrice Lecher, MD

## 2021-04-24 NOTE — Telephone Encounter (Signed)
I called [facility] today and they have ordered the CPAP. They will call the patient to schedule once they receive the CPAP. Patient is aware.

## 2021-05-01 ENCOUNTER — Other Ambulatory Visit: Payer: Self-pay | Admitting: Family Medicine

## 2021-05-08 ENCOUNTER — Other Ambulatory Visit: Payer: Self-pay | Admitting: Family Medicine

## 2021-05-08 DIAGNOSIS — Z1231 Encounter for screening mammogram for malignant neoplasm of breast: Secondary | ICD-10-CM

## 2021-05-21 ENCOUNTER — Ambulatory Visit: Payer: BC Managed Care – PPO

## 2021-05-28 ENCOUNTER — Ambulatory Visit (INDEPENDENT_AMBULATORY_CARE_PROVIDER_SITE_OTHER): Payer: BC Managed Care – PPO

## 2021-05-28 ENCOUNTER — Other Ambulatory Visit: Payer: Self-pay

## 2021-05-28 DIAGNOSIS — Z1231 Encounter for screening mammogram for malignant neoplasm of breast: Secondary | ICD-10-CM | POA: Diagnosis not present

## 2021-05-28 NOTE — Progress Notes (Signed)
Please call patient. Normal mammogram.  Repeat in 1 year.  

## 2021-07-17 ENCOUNTER — Other Ambulatory Visit: Payer: Self-pay | Admitting: Family Medicine

## 2021-07-17 DIAGNOSIS — E119 Type 2 diabetes mellitus without complications: Secondary | ICD-10-CM

## 2021-07-23 LAB — HM COLONOSCOPY

## 2021-07-29 ENCOUNTER — Ambulatory Visit: Payer: BC Managed Care – PPO | Admitting: Family Medicine

## 2021-07-29 ENCOUNTER — Encounter: Payer: Self-pay | Admitting: Family Medicine

## 2021-07-29 VITALS — BP 117/75 | HR 92 | Ht 66.0 in | Wt 284.0 lb

## 2021-07-29 DIAGNOSIS — E119 Type 2 diabetes mellitus without complications: Secondary | ICD-10-CM | POA: Diagnosis not present

## 2021-07-29 DIAGNOSIS — F3341 Major depressive disorder, recurrent, in partial remission: Secondary | ICD-10-CM

## 2021-07-29 DIAGNOSIS — G4733 Obstructive sleep apnea (adult) (pediatric): Secondary | ICD-10-CM

## 2021-07-29 LAB — POCT GLYCOSYLATED HEMOGLOBIN (HGB A1C): Hemoglobin A1C: 6 % — AB (ref 4.0–5.6)

## 2021-07-29 MED ORDER — VENLAFAXINE HCL ER 37.5 MG PO CP24: 37.5000 mg | ORAL_CAPSULE | Freq: Every day | ORAL | 1 refills | Status: DC

## 2021-07-29 NOTE — Assessment & Plan Note (Signed)
Reports that she is very consistent in wearing her CPAP every night.  She tolerates it really well.  But she is hopeful she may be able to come off after bariatric surgery. ?

## 2021-07-29 NOTE — Progress Notes (Signed)
? ?Established Patient Office Visit ? ?Subjective   ?Patient ID: Kaitlyn Kirby, female    DOB: 11/02/1970  Age: 51 y.o. MRN: [id number] ? ?Chief Complaint  ?Patient presents with  ? Diabetes  ? ? ?HPI ? ?Diabetes - no hypoglycemic events. No wounds or sores that are not healing well. No increased thirst or urination. Checking glucose at home. Taking medications as prescribed without any side effects. ? 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   ? ?She has been working with [employer] until recently.  She has seriously been considering surgical treatment for bariatric surgery.  She has a consultation with Dr. Volanda Napoleon coming up in the next couple of weeks.  She is hoping that she might never get the surgery done this summer.  She says ultimately her goal is to be able to get off of all these medications and hopefully her CPAP as well. ? ? ? ?ROS ? ?  ?Objective:  ?  ? ?BP 117/75   Pulse 92   Ht '5\' 6"'$  (1.676 m)   Wt 284 lb (128.8 kg)   SpO2 96%   BMI 45.84 kg/m?  ? ? ?Physical Exam ?Vitals and nursing note reviewed.  ?Constitutional:   ?   Appearance: She is well-developed.  ?HENT:  ?   Head: Normocephalic and atraumatic.  ?Cardiovascular:  ?   Rate and Rhythm: Normal rate and regular rhythm.  ?   Heart sounds: Normal heart sounds.  ?Pulmonary:  ?   Effort: Pulmonary effort is normal.  ?   Breath sounds: Normal breath sounds.  ?Skin: ?   General: Skin is warm and dry.  ?Neurological:  ?   Mental Status: She is alert and oriented to person, place, and time.  ?Psychiatric:     ?   Behavior: Behavior normal.  ? ? ? ?Results for orders placed or performed in visit on 07/29/21  ?POCT glycosylated hemoglobin (Hb A1C)  ?Result Value Ref Range  ? Hemoglobin A1C 6.0 (A) 4.0 - 5.6 %  ? HbA1c POC (<> result, manual entry)    ? HbA1c, POC (prediabetic range)    ? HbA1c, POC (controlled diabetic range)    ? ? ? ? ?The 10-year ASCVD risk  score (Arnett DK, et al., 2019) is: 2.4% ? ?  ?Assessment & Plan:  ? ?Problem List Items Addressed This Visit   ? ?  ? Respiratory  ? OSA (obstructive sleep apnea)  ?  Reports that she is very consistent in wearing her CPAP every night.  She tolerates it really well.  But she is hopeful she may be able to come off after bariatric surgery. ? ?  ?  ?  ? Endocrine  ? Controlled type 2 diabetes mellitus ([place]) - Primary  ?  A1c looks absolutely phenomenal today.  I think she is doing fantastic.  Sinew with Ozempic.  Continue to work on [employer] and regular exercise. ?Lab Results  ?Component Value Date  ? HGBA1C 6.0 (A) 07/29/2021  ? ? ?  ?  ? Relevant Orders  ? POCT glycosylated hemoglobin (Hb A1C) (Completed)  ?  ? Other  ? Depression  ?  Is doing well on her current regimen overall.  It has been a stressful year at work.  Again she is hoping to retire in about 2 years. ? ?  ?  ? Relevant Medications  ?  venlafaxine XR (EFFEXOR-XR) 37.5 MG 24 hr capsule  ? ? ?Return in about 3 months (around 11/03/2021) for Diabetes follow-up.  ? ? ?Beatrice Lecher, MD ? ?

## 2021-07-29 NOTE — Assessment & Plan Note (Signed)
Is doing well on her current regimen overall.  It has been a stressful year at work.  Again she is hoping to retire in about 2 years. ?

## 2021-07-29 NOTE — Assessment & Plan Note (Signed)
A1c looks absolutely phenomenal today.  I think she is doing fantastic.  Sinew with Ozempic.  Continue to work on [employer] and regular exercise. ?Lab Results  ?Component Value Date  ? HGBA1C 6.0 (A) 07/29/2021  ? ? ?

## 2021-08-06 LAB — HM DIABETES EYE EXAM

## 2021-08-07 ENCOUNTER — Telehealth: Payer: Self-pay | Admitting: *Deleted

## 2021-08-07 NOTE — Telephone Encounter (Signed)
LVM informing Pt that she will need to take her cpap by [facility] for them to do a download so that her pressure can be set.  ?

## 2021-08-08 ENCOUNTER — Encounter: Payer: Self-pay | Admitting: Family Medicine

## 2021-08-20 NOTE — Telephone Encounter (Signed)
Received download report for her CPAP.  Currently AutoPap settings are set between 4 and 18 cm of water pressure.  Mean pressure was 5.8, average was 6.9.  Highest daily average was 7 and lowest Daily average was 5.  Average leak amount was 4.8 L/min.  With an AHI of 0.2.  Please call them and have them set her CPAP to 6 cm of water pressure instead of AutoPap.

## 2021-08-21 NOTE — Telephone Encounter (Signed)
Informed she will now need to take her machine back to the local branch so that they can set her CPAP.   I called the pt and LVM advising her that since they were able to do the download they already have what her machine should be set on (which is 6 cm of water pressure). Advised her to rtn call if any questions.

## 2021-08-26 ENCOUNTER — Encounter: Payer: Self-pay | Admitting: *Deleted

## 2021-08-26 LAB — CBC AND DIFFERENTIAL
HCT: 44 (ref 36–46)
Hemoglobin: 15.2 (ref 12.0–16.0)
WBC: 11.9

## 2021-08-26 LAB — BASIC METABOLIC PANEL
BUN: 10 (ref 4–21)
CO2: 24 — AB (ref 13–22)
Chloride: 100 (ref 99–108)
Creatinine: 0.9 (ref 0.5–1.1)
Glucose: 118
Sodium: 137 (ref 137–147)

## 2021-08-26 LAB — LIPID PANEL
Cholesterol: 174 (ref 0–200)
HDL: 35 (ref 35–70)
LDL Cholesterol: 110
Triglycerides: 164 — AB (ref 40–160)

## 2021-08-26 LAB — HEPATIC FUNCTION PANEL
ALT: 13 U/L (ref 7–35)
AST: 12 — AB (ref 13–35)
Alkaline Phosphatase: 90 (ref 25–125)
Bilirubin, Total: 0.9

## 2021-08-26 LAB — IRON,TIBC AND FERRITIN PANEL: Ferritin: 129

## 2021-08-26 LAB — VITAMIN D 25 HYDROXY (VIT D DEFICIENCY, FRACTURES): Vit D, 25-Hydroxy: 10.2

## 2021-08-26 LAB — COMPREHENSIVE METABOLIC PANEL
Albumin: 4.3 (ref 3.5–5.0)
Calcium: 9.6 (ref 8.7–10.7)
Globulin: 2.8
eGFR: 83

## 2021-08-26 LAB — CBC: RBC: 4.71 (ref 3.87–5.11)

## 2021-08-26 LAB — HEMOGLOBIN A1C: Hemoglobin A1C: 7

## 2021-08-26 LAB — TSH: TSH: 3.38 (ref 0.41–5.90)

## 2021-08-26 LAB — VITAMIN B12: Vitamin B-12: 134

## 2021-08-28 ENCOUNTER — Other Ambulatory Visit: Payer: Self-pay | Admitting: *Deleted

## 2021-08-28 DIAGNOSIS — G4733 Obstructive sleep apnea (adult) (pediatric): Secondary | ICD-10-CM

## 2021-08-28 MED ORDER — AMBULATORY NON FORMULARY MEDICATION: 0 refills | Status: DC

## 2021-09-14 ENCOUNTER — Encounter: Payer: Self-pay | Admitting: Family Medicine

## 2021-10-15 ENCOUNTER — Telehealth: Payer: Self-pay | Admitting: Family Medicine

## 2021-10-15 ENCOUNTER — Encounter: Payer: Self-pay | Admitting: Family Medicine

## 2021-10-15 ENCOUNTER — Encounter (INDEPENDENT_AMBULATORY_CARE_PROVIDER_SITE_OTHER): Payer: BC Managed Care – PPO | Admitting: Family Medicine

## 2021-10-15 ENCOUNTER — Ambulatory Visit: Payer: BC Managed Care – PPO | Admitting: Family Medicine

## 2021-10-15 ENCOUNTER — Other Ambulatory Visit: Payer: Self-pay | Admitting: Family Medicine

## 2021-10-15 VITALS — BP 110/72 | HR 91 | Ht 66.0 in | Wt 282.0 lb

## 2021-10-15 DIAGNOSIS — E119 Type 2 diabetes mellitus without complications: Secondary | ICD-10-CM

## 2021-10-15 DIAGNOSIS — E538 Deficiency of other specified B group vitamins: Secondary | ICD-10-CM | POA: Diagnosis not present

## 2021-10-15 DIAGNOSIS — E559 Vitamin D deficiency, unspecified: Secondary | ICD-10-CM | POA: Diagnosis not present

## 2021-10-15 MED ORDER — SEMAGLUTIDE (2 MG/DOSE) 8 MG/3ML ~~LOC~~ SOPN: 2.0000 mg | PEN_INJECTOR | SUBCUTANEOUS | 1 refills | Status: DC

## 2021-10-15 MED ORDER — TIRZEPATIDE 10 MG/0.5ML ~~LOC~~ SOAJ
10.0000 mg | SUBCUTANEOUS | 0 refills | Status: DC
Start: 2021-10-15 — End: 2021-10-15

## 2021-10-15 NOTE — Assessment & Plan Note (Signed)
We discussed options of injection versus supplement.  We will start with a supplement and then plan to recheck labs again in about 8 weeks.  That way we can tell if it is absorbing well if not then consider switching to B12 injections

## 2021-10-15 NOTE — Progress Notes (Signed)
Established Patient Office Visit  Subjective   Patient ID: Kaitlyn Kirby, female    DOB: 12/06/1970  Age: 51 y.o. MRN: [id number]  Chief Complaint  Patient presents with   Follow-up         HPI  Diabetes - no hypoglycemic events. No wounds or sores that are not healing well. No increased thirst or urination. Checking glucose at home. Taking medications as prescribed without any side effects.  Last time she was here in May hemoglobin A1c looks fantastic.  But she recently had labs done through Dr. Volanda Napoleon and her hemoglobin A1c there was 7.0.  She was also noted to have low B12.  Currently on 2 mg of Ozempic.  B12 on labs from May 30 was 134.  With normal range being 2 32-12 100.  Folate was normal.  Iron stores was normal.  Vitamin D was also low at 10.2.  She did start a vitamin D, vitamin B12, vitamin K and a multivitamin supplement daily.    ROS    Objective:     BP 110/72 (BP Location: Left Arm)   Pulse 91   Ht 5' 6"  (1.676 m)   Wt 282 lb (127.9 kg)   SpO2 95%   BMI 45.52 kg/m    Physical Exam Vitals and nursing note reviewed.  Constitutional:      Appearance: She is well-developed.  HENT:     Head: Normocephalic and atraumatic.  Cardiovascular:     Rate and Rhythm: Normal rate and regular rhythm.     Heart sounds: Normal heart sounds.  Pulmonary:     Effort: Pulmonary effort is normal.     Breath sounds: Normal breath sounds.  Skin:    General: Skin is warm and dry.     Comments: He has several small skin tags around her neck.  Neurological:     Mental Status: She is alert and oriented to person, place, and time.  Psychiatric:        Behavior: Behavior normal.    Results for orders placed or performed in visit on 10/15/21  Iron, TIBC and Ferritin Panel  Result Value Ref Range   Ferritin 129   CBC and differential  Result Value Ref Range   Hemoglobin 15.2 12.0 - 16.0   HCT 44 36 - 46   WBC 11.9   CBC  Result Value Ref Range   RBC 4.71 3.87 -  5.11  VITAMIN D 25 Hydroxy (Vit-D Deficiency, Fractures)  Result Value Ref Range   Vit D, 25-Hydroxy 49.8   Basic metabolic panel  Result Value Ref Range   Glucose 118    BUN 10 4 - 21   CO2 24 (A) 13 - 22   Creatinine 0.9 0.5 - 1.1   Sodium 137 137 - 147   Chloride 100 99 - 108  Comprehensive metabolic panel  Result Value Ref Range   Globulin 2.8    eGFR 83    Calcium 9.6 8.7 - 10.7   Albumin 4.3 3.5 - 5.0  Lipid panel  Result Value Ref Range   Triglycerides 164 (A) 40 - 160   Cholesterol 174 0 - 200   HDL 35 35 - 70   LDL Cholesterol 110   Hepatic function panel  Result Value Ref Range   Alkaline Phosphatase 90 25 - 125   ALT 13 7 - 35 U/L   AST 12 (A) 13 - 35   Bilirubin, Total 0.9   Vitamin B12  Result Value Ref Range   Vitamin B-12 134   Hemoglobin A1c  Result Value Ref Range   Hemoglobin A1C 7.0   TSH  Result Value Ref Range   TSH 3.38 0.41 - 5.90      The 10-year ASCVD risk score (Arnett DK, et al., 2019) is: 2.8%* (Cholesterol units were assumed)    Assessment & Plan:   Problem List Items Addressed This Visit       Endocrine   Controlled type 2 diabetes mellitus ([place])    We discussed options.  We will switch to [facility] per her preference.  We did discuss that there are both GLP-1's.  But she would like to try the newer one.  Did encourage her to go online and get a co-pay card.  Plan to recheck her A1c at the end of August.  She is planning on getting back on track with her exercise she is was playing pickle ball and try to get more active.  For now continue with Ozempic until we can get it switched to the [facility].      Relevant Medications   tirzepatide (MOUNJARO) 10 MG/0.5ML Pen     Other   Vitamin D deficiency    Taking 10,000 IU daily.  Plan to recheck in 8 to 12 weeks.      B12 deficiency - Primary    We discussed options of injection versus supplement.  We will start with a supplement and then plan to recheck labs again in about 8  weeks.  That way we can tell if it is absorbing well if not then consider switching to B12 injections      She also has some skin tags around her neck and underneath both breast that she would like removed.  Encouraged her to schedule at her convenience this summer.  Will call to get for colonoscopy report.    Return in about 6 weeks (around 11/28/2021) for Diabetes follow-up.    Beatrice Lecher, MD

## 2021-10-15 NOTE — Assessment & Plan Note (Signed)
We discussed options.  We will switch to [facility] per her preference.  We did discuss that there are both GLP-1's.  But she would like to try the newer one.  Did encourage her to go online and get a co-pay card.  Plan to recheck her A1c at the end of August.  She is planning on getting back on track with her exercise she is was playing pickle ball and try to get more active.  For now continue with Ozempic until we can get it switched to the [facility].

## 2021-10-15 NOTE — Telephone Encounter (Signed)
Please call patient and let her know that we have got a decline on the [facility] from her pharmacy so I resent her Ozempic.

## 2021-10-15 NOTE — Telephone Encounter (Signed)
Call digestive health for colonoscopy report.

## 2021-10-15 NOTE — Assessment & Plan Note (Signed)
Taking 10,000 IU daily.  Plan to recheck in 8 to 12 weeks.

## 2021-10-16 NOTE — Telephone Encounter (Signed)
Left a detailed vm msg for patient regarding provider's note. Informed that insurance did not cover Mounjaro rx. Aware that Ozempic sent to the pharmacy as an alternative.

## 2021-10-17 NOTE — Telephone Encounter (Signed)
Records request sent.

## 2021-10-31 MED ORDER — SEMAGLUTIDE (1 MG/DOSE) 4 MG/3ML ~~LOC~~ SOPN: 1.0000 mg | PEN_INJECTOR | SUBCUTANEOUS | 0 refills | Status: DC

## 2021-10-31 NOTE — Telephone Encounter (Signed)
Sending in the lower dose Mounjaro.  I spent 5 total minutes of online digital evaluation and management services in this patient-initiated request for online care.

## 2021-10-31 NOTE — Addendum Note (Signed)
Addended by: Narda Rutherford on: 10/31/2021 07:41 AM   Modules accepted: Orders

## 2021-10-31 NOTE — Addendum Note (Signed)
Addended by: Silverio Decamp on: 10/31/2021 12:37 PM   Modules accepted: Orders

## 2021-12-04 ENCOUNTER — Encounter: Payer: Self-pay | Admitting: Family Medicine

## 2021-12-04 ENCOUNTER — Ambulatory Visit: Payer: BC Managed Care – PPO | Admitting: Family Medicine

## 2021-12-04 VITALS — BP 116/77 | HR 88 | Ht 66.0 in | Wt 280.0 lb

## 2021-12-04 DIAGNOSIS — Z7689 Persons encountering health services in other specified circumstances: Secondary | ICD-10-CM

## 2021-12-04 DIAGNOSIS — E119 Type 2 diabetes mellitus without complications: Secondary | ICD-10-CM | POA: Diagnosis not present

## 2021-12-04 LAB — POCT GLYCOSYLATED HEMOGLOBIN (HGB A1C): HbA1c, POC (controlled diabetic range): 6 % (ref 0.0–7.0)

## 2021-12-04 MED ORDER — SEMAGLUTIDE (1 MG/DOSE) 4 MG/3ML ~~LOC~~ SOPN: 1.0000 mg | PEN_INJECTOR | SUBCUTANEOUS | 1 refills | Status: DC

## 2021-12-04 NOTE — Assessment & Plan Note (Signed)
A1c improved to 6.5 today looking much better.  She wants to stick with the 1 mg semaglutide for now because she is unable to get it more consistently.  She is really been only back on it for about 2 weeks and she was off of it for about a month.  So suspect her next A1c will look even better she is been working really hard on portion control and preparing foods.  She is been walking daily for exercise.  She really like to get back into her water aerobics as we get into the fall.  But right now she is working 2 jobs.

## 2021-12-04 NOTE — Progress Notes (Signed)
Established Patient Office Visit  Subjective   Patient ID: ISLA SABREE, female    DOB: 04/25/1970  Age: 51 y.o. MRN: [id number]  Chief Complaint  Patient presents with   Diabetes    HPI  Diabetes - no hypoglycemic events. No wounds or sores that are not healing well. No increased thirst or urination. Checking glucose at home. Taking medications as prescribed without any side effects.     ROS    Objective:     BP 116/77   Pulse 88   Ht '5\' 6"'$  (1.676 m)   Wt 280 lb (127 kg)   SpO2 98%   BMI 45.19 kg/m    Physical Exam Vitals and nursing note reviewed.  Constitutional:      Appearance: She is well-developed.  HENT:     Head: Normocephalic and atraumatic.  Cardiovascular:     Rate and Rhythm: Normal rate and regular rhythm.     Heart sounds: Normal heart sounds.  Pulmonary:     Effort: Pulmonary effort is normal.     Breath sounds: Normal breath sounds.  Skin:    General: Skin is warm and dry.  Neurological:     Mental Status: She is alert and oriented to person, place, and time.  Psychiatric:        Behavior: Behavior normal.     Results for orders placed or performed in visit on 12/04/21  POCT glycosylated hemoglobin (Hb A1C)  Result Value Ref Range   Hemoglobin A1C     HbA1c POC (<> result, manual entry)     HbA1c, POC (prediabetic range)     HbA1c, POC (controlled diabetic range) 6.0 0.0 - 7.0 %      The 10-year ASCVD risk score (Arnett DK, et al., 2019) is: 3.1%* (Cholesterol units were assumed)    Assessment & Plan:   Problem List Items Addressed This Visit       Endocrine   Controlled type 2 diabetes mellitus ([place]) - Primary    A1c improved to 6.5 today looking much better.  She wants to stick with the 1 mg semaglutide for now because she is unable to get it more consistently.  She is really been only back on it for about 2 weeks and she was off of it for about a month.  So suspect her next A1c will look even better she is been working  really hard on portion control and preparing foods.  She is been walking daily for exercise.  She really like to get back into her water aerobics as we get into the fall.  But right now she is working 2 jobs.      Relevant Medications   Semaglutide, 1 MG/DOSE, 4 MG/3ML SOPN   Other Relevant Orders   POCT glycosylated hemoglobin (Hb A1C) (Completed)     Other   Encounter for weight management    Visit #:2 Starting DQQIWL:798 lbs   Current weight: 280 lbs  Previous weight: 282 lbs  Change in weight: Down 2 lbs  Goal weight: Dietary goals: Continue to portion control she is eating 6 small meals a day, work on increasing vegetable intake Exercise goals: Continue with daily walking and hopefully will be able to add in water aerobics twice a week Medication: Semaglutide 1 mg Follow-up and referrals: 3 mo        Diabetic Foot Exam - Simple   Simple Foot Form Diabetic Foot exam was performed with the following findings: Yes 12/04/2021  4:22 PM  Visual Inspection No deformities, no ulcerations, no other skin breakdown bilaterally: Yes See comments: Yes Sensation Testing Intact to touch and monofilament testing bilaterally: Yes Pulse Check Posterior Tibialis and Dorsalis pulse intact bilaterally: Yes Comments swelling     Return in about 3 months (around 03/09/2022) for Diabetes follow-up.    Beatrice Lecher, MD

## 2021-12-04 NOTE — Assessment & Plan Note (Signed)
Visit #:2 Starting AWNOPW:256 lbs   Current weight: 280 lbs  Previous weight: 282 lbs  Change in weight: Down 2 lbs  Goal weight: Dietary goals: Continue to portion control she is eating 6 small meals a day, work on increasing vegetable intake Exercise goals: Continue with daily walking and hopefully will be able to add in water aerobics twice a week Medication: Semaglutide 1 mg Follow-up and referrals: 3 mo

## 2021-12-17 ENCOUNTER — Telehealth: Payer: Self-pay

## 2021-12-17 NOTE — Telephone Encounter (Signed)
Initiated Prior authorization SJW:[id number] (2 MG/DOSE) '8MG'$ /3ML pen-injectors Via: Covermymeds Case/Key:[id number] Status: approved  as of 12/17/21 Reason:, this request is approved for the following time period: 11/13/2021 - 11/13/2024 Notified Pt via: Mychart

## 2021-12-20 ENCOUNTER — Telehealth: Payer: Self-pay

## 2022-01-01 NOTE — Telephone Encounter (Signed)
Completed.

## 2022-01-06 ENCOUNTER — Encounter: Payer: Self-pay | Admitting: Family Medicine

## 2022-01-06 ENCOUNTER — Ambulatory Visit: Payer: BC Managed Care – PPO | Admitting: Family Medicine

## 2022-01-06 ENCOUNTER — Ambulatory Visit (INDEPENDENT_AMBULATORY_CARE_PROVIDER_SITE_OTHER): Payer: BC Managed Care – PPO

## 2022-01-06 VITALS — BP 119/85 | HR 93 | Temp 98.5°F | Ht 66.0 in | Wt 277.0 lb

## 2022-01-06 DIAGNOSIS — R062 Wheezing: Secondary | ICD-10-CM | POA: Diagnosis not present

## 2022-01-06 DIAGNOSIS — R059 Cough, unspecified: Secondary | ICD-10-CM

## 2022-01-06 DIAGNOSIS — R17 Unspecified jaundice: Secondary | ICD-10-CM

## 2022-01-06 DIAGNOSIS — R0609 Other forms of dyspnea: Secondary | ICD-10-CM

## 2022-01-06 DIAGNOSIS — R051 Acute cough: Secondary | ICD-10-CM | POA: Diagnosis not present

## 2022-01-06 MED ORDER — GUAIFENESIN-DM 100-10 MG/5ML PO SYRP: 5.0000 mL | ORAL_SOLUTION | ORAL | 0 refills | Status: DC | PRN

## 2022-01-06 MED ORDER — METHYLPREDNISOLONE 4 MG PO TBPK: ORAL_TABLET | ORAL | 0 refills | Status: DC

## 2022-01-06 NOTE — Assessment & Plan Note (Signed)
-   given patient Robitussin DM for cough - encouraged pt to use inhaler for wheezing

## 2022-01-06 NOTE — Progress Notes (Unsigned)
Acute Office Visit  Subjective:     Patient ID: Kaitlyn Kirby, female    DOB: 05/20/1970, 51 y.o.   MRN: [id number]  Chief Complaint  Patient presents with   Cough    HPI Patient is in today for coughing. She notes a heaviness in her chest. She got a cold a week and a half ago---headaches and nasal drainage. She felt better last week and has been getting harder to breath. 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 Admits to wheezing. She is using her inhaler.   Review of Systems  Constitutional:  Negative for chills and fever.  Respiratory:  Positive for cough, shortness of breath and wheezing.   Cardiovascular:  Negative for chest pain.  Neurological:  Negative for headaches.        Objective:    BP 119/85   Pulse 93   Temp 98.5 F (36.9 C)   Ht '5\' 6"'$  (1.676 m)   Wt 277 lb (125.6 kg)   SpO2 96%   BMI 44.71 kg/m    Physical Exam Vitals and nursing note reviewed.  Constitutional:      General: She is not in acute distress.    Appearance: Normal appearance.  HENT:     Head: Normocephalic and atraumatic.     Right Ear: Tympanic membrane, ear canal and external ear normal.     Left Ear: Tympanic membrane, ear canal and external ear normal.     Nose: Nose normal.  Eyes:     Conjunctiva/sclera: Conjunctivae normal.     Comments: Slightly jaundice on exam  Cardiovascular:     Rate and Rhythm: Normal rate and regular rhythm.  Pulmonary:     Effort: Pulmonary effort is normal.     Breath sounds: Wheezing (inspiratory) present.     Comments: Coarse breath sounds throughout lung fields.  Neurological:     General: No focal deficit present.     Mental Status: She is alert and oriented to person, place, and time.  Psychiatric:        Mood and Affect: Mood normal.        Behavior: Behavior normal.        Thought Content: Thought content normal.        Judgment: Judgment normal.     No results found for any visits on 01/06/22.       Assessment & Plan:   Problem List Items Addressed This Visit       Other   Acute cough - Primary    - given patient Robitussin DM for cough - encouraged pt to use inhaler for wheezing       Relevant Medications   guaiFENesin-dextromethorphan (ROBITUSSIN DM) 100-10 MG/5ML syrup   Wheezing    - given steroid dose pack to help decrease inflammation and improve breathing       Relevant Medications   methylPREDNISolone (MEDROL DOSEPAK) 4 MG TBPK tablet   Jaundice    - scleral icterus present on exam  - have ordered CMP for further investigation       Relevant Orders   CBC   COMPLETE METABOLIC PANEL WITH GFR   Dyspnea on exertion    - have ordered D-dimer. Pt has low likelihood of PE with no hx of malignancy, dvts, long car rides - ordered CXR for concerns of pneumonia.  - ordered a CBC to check wbc       Relevant Orders   DG  Chest 2 View   D-Dimer, Quantitative    Meds ordered this encounter  Medications   methylPREDNISolone (MEDROL DOSEPAK) 4 MG TBPK tablet    Sig: Take tablet pack as instructed    Dispense:  21 tablet    Refill:  0   guaiFENesin-dextromethorphan (ROBITUSSIN DM) 100-10 MG/5ML syrup    Sig: Take 5 mLs by mouth every 4 (four) hours as needed for cough.    Dispense:  118 mL    Refill:  0    No follow-ups on file.  Owens Loffler, DO

## 2022-01-06 NOTE — Assessment & Plan Note (Signed)
-   scleral icterus present on exam  - have ordered CMP for further investigation

## 2022-01-06 NOTE — Assessment & Plan Note (Addendum)
-   have ordered D-dimer. Pt has low likelihood of PE with no hx of malignancy, dvts, long car rides - ordered CXR for concerns of pneumonia.  - ordered a CBC to check wbc

## 2022-01-06 NOTE — Assessment & Plan Note (Signed)
-   given steroid dose pack to help decrease inflammation and improve breathing

## 2022-01-07 ENCOUNTER — Ambulatory Visit: Payer: BC Managed Care – PPO | Admitting: Family Medicine

## 2022-01-07 ENCOUNTER — Telehealth: Payer: Self-pay

## 2022-01-07 LAB — CBC
HCT: 45.5 % — ABNORMAL HIGH (ref 35.0–45.0)
Hemoglobin: 16 g/dL — ABNORMAL HIGH (ref 11.7–15.5)
MCH: 32.3 pg (ref 27.0–33.0)
MCHC: 35.2 g/dL (ref 32.0–36.0)
MCV: 91.7 fL (ref 80.0–100.0)
MPV: 11.2 fL (ref 7.5–12.5)
Platelets: 279 10*3/uL (ref 140–400)
RBC: 4.96 10*6/uL (ref 3.80–5.10)
RDW: 12.2 % (ref 11.0–15.0)
WBC: 10.7 10*3/uL (ref 3.8–10.8)

## 2022-01-07 LAB — COMPLETE METABOLIC PANEL WITH GFR
AG Ratio: 1.4 (calc) (ref 1.0–2.5)
ALT: 25 U/L (ref 6–29)
AST: 17 U/L (ref 10–35)
Albumin: 4.3 g/dL (ref 3.6–5.1)
Alkaline phosphatase (APISO): 87 U/L (ref 37–153)
BUN: 12 mg/dL (ref 7–25)
CO2: 28 mmol/L (ref 20–32)
Calcium: 9.8 mg/dL (ref 8.6–10.4)
Chloride: 101 mmol/L (ref 98–110)
Creat: 0.9 mg/dL (ref 0.50–1.03)
Globulin: 3 g/dL (calc) (ref 1.9–3.7)
Glucose, Bld: 124 mg/dL — ABNORMAL HIGH (ref 65–99)
Potassium: 4.8 mmol/L (ref 3.5–5.3)
Sodium: 137 mmol/L (ref 135–146)
Total Bilirubin: 0.9 mg/dL (ref 0.2–1.2)
Total Protein: 7.3 g/dL (ref 6.1–8.1)
eGFR: 77 mL/min/{1.73_m2} (ref >=60)

## 2022-01-07 LAB — D-DIMER, QUANTITATIVE: D-Dimer, Quant: <0.19 mcg/mL FEU (ref <0.50)

## 2022-01-08 NOTE — Telephone Encounter (Signed)
Left message advising patient she can print letter from [facility] and if any problems call us back.

## 2022-01-20 ENCOUNTER — Other Ambulatory Visit: Payer: Self-pay | Admitting: Family Medicine

## 2022-01-20 DIAGNOSIS — F3341 Major depressive disorder, recurrent, in partial remission: Secondary | ICD-10-CM

## 2022-02-10 ENCOUNTER — Other Ambulatory Visit: Payer: Self-pay | Admitting: Family Medicine

## 2022-03-10 ENCOUNTER — Ambulatory Visit: Payer: BC Managed Care – PPO | Admitting: Family Medicine

## 2022-03-10 ENCOUNTER — Encounter: Payer: Self-pay | Admitting: Family Medicine

## 2022-03-10 VITALS — BP 132/84 | HR 84 | Ht 66.0 in | Wt 279.1 lb

## 2022-03-10 DIAGNOSIS — B9689 Other specified bacterial agents as the cause of diseases classified elsewhere: Secondary | ICD-10-CM

## 2022-03-10 DIAGNOSIS — N393 Stress incontinence (female) (male): Secondary | ICD-10-CM | POA: Insufficient documentation

## 2022-03-10 DIAGNOSIS — N390 Urinary tract infection, site not specified: Secondary | ICD-10-CM | POA: Diagnosis not present

## 2022-03-10 DIAGNOSIS — E119 Type 2 diabetes mellitus without complications: Secondary | ICD-10-CM

## 2022-03-10 DIAGNOSIS — R319 Hematuria, unspecified: Secondary | ICD-10-CM | POA: Diagnosis not present

## 2022-03-10 DIAGNOSIS — N760 Acute vaginitis: Secondary | ICD-10-CM | POA: Diagnosis not present

## 2022-03-10 LAB — POCT URINALYSIS DIP (CLINITEK)
Bilirubin, UA: NEGATIVE
Glucose, UA: 500 mg/dL — AB
Nitrite, UA: NEGATIVE
POC PROTEIN,UA: 30 — AB
Spec Grav, UA: >=1.03 — AB (ref 1.010–1.025)
Urobilinogen, UA: 0.2 E.U./dL
pH, UA: 6 (ref 5.0–8.0)

## 2022-03-10 NOTE — Assessment & Plan Note (Signed)
Discussed using quarter the the clicks on the new pen, weekly for 1 month and then go up to half the clicks.  Go ahead and fill the 2 mg when it is due in a month.  Will try to work back up to 2 mg.  Especially if she is tolerating it well.

## 2022-03-10 NOTE — Assessment & Plan Note (Signed)
We discussed working on voiding intermittently, working on Kegel exercises which she is familiar with.  Possibly wearing a pad or more absorbent underwear to see if that is helpful.  We also discussed that at some point if it is getting worse or affecting her quality of life I am more than happy to refer her to urology.

## 2022-03-10 NOTE — Progress Notes (Signed)
Acute Office Visit  Subjective:     Patient ID: Kaitlyn Kirby, female    DOB: 1970-04-27, 51 y.o.   MRN: [id number]  Chief Complaint  Patient presents with   Urinary Tract Infection    HPI Patient is in today for vaginitis. She has had irritation, itching and difficulty urinating and urinary frequency.  She says she use some Azo and Monistat over the weekend and started to get a little bit of relief last night.  No fevers chills or sweats.  Reports some stress incontinence that is been going on for a while with coughing and sneezing.  She says sometimes she will leak and can always change her close promptly when she is stuck at work and she thinks that may have been what actually triggered the UTI.  She has been out of her Ozempic for most 2 months.  She has not been able to get it at the local pharmacy.  They just called her and told her that they did have a 2 mg dose so she has picked it up but was hesitant to start at 2 mg.  ROS      Objective:    BP 132/84 (BP Location: Left Arm, Patient Position: Sitting, Cuff Size: Large)   Pulse 84   Ht '5\' 6"'$  (1.676 m)   Wt 279 lb 1.9 oz (126.6 kg)   SpO2 97%   BMI 45.05 kg/m    Physical Exam Vitals reviewed.  Constitutional:      Appearance: She is well-developed.  HENT:     Head: Normocephalic and atraumatic.  Eyes:     Conjunctiva/sclera: Conjunctivae normal.  Cardiovascular:     Rate and Rhythm: Normal rate.  Pulmonary:     Effort: Pulmonary effort is normal.  Skin:    General: Skin is dry.     Coloration: Skin is not pale.  Neurological:     Mental Status: She is alert and oriented to person, place, and time.  Psychiatric:        Behavior: Behavior normal.     Results for orders placed or performed in visit on 03/10/22  WET PREP FOR [place], YEAST, CLUE  Result Value Ref Range   MICRO NUMBER: [id number]    Specimen Quality Adequate    SOURCE: NOT GIVEN    Status FINAL    RESULT (A)     No Trichomonas vaginalis  seen. No yeast seen Clue cells seen Epithelial Cells Present  POCT URINALYSIS DIP (CLINITEK)  Result Value Ref Range   Color, UA yellow yellow   Clarity, UA clear clear   Glucose, UA =500 (A) negative mg/dL   Bilirubin, UA negative negative   Ketones, POC UA small (15) (A) negative mg/dL   Spec Grav, UA >=1.030 (A) 1.010 - 1.025   Blood, UA trace-lysed (A) negative   pH, UA 6.0 5.0 - 8.0   POC PROTEIN,UA =30 (A) negative, trace   Urobilinogen, UA 0.2 0.2 or 1.0 E.U./dL   Nitrite, UA Negative Negative   Leukocytes, UA Small (1+) (A) Negative        Assessment & Plan:   Problem List Items Addressed This Visit       Endocrine   Controlled type 2 diabetes mellitus ([place])    Discussed using quarter the the clicks on the new pen, weekly for 1 month and then go up to half the clicks.  Go ahead and fill the 2 mg when it is due in a month.  Will  try to work back up to 2 mg.  Especially if she is tolerating it well.        Other   Stress incontinence    We discussed working on voiding intermittently, working on Kegel exercises which she is familiar with.  Possibly wearing a pad or more absorbent underwear to see if that is helpful.  We also discussed that at some point if it is getting worse or affecting her quality of life I am more than happy to refer her to urology.      Other Visit Diagnoses     Urinary tract infection with hematuria, site unspecified    -  Primary   Relevant Medications   metroNIDAZOLE (FLAGYL) 500 MG tablet   Other Relevant Orders   POCT URINALYSIS DIP (CLINITEK) (Completed)   Urine Culture   Hematuria, unspecified type       Relevant Orders   Urine Culture   Acute vaginitis       Relevant Orders   WET PREP FOR TRICH, YEAST, CLUE (Completed)   BV (bacterial vaginosis)       Relevant Medications   metroNIDAZOLE (FLAGYL) 500 MG tablet       Urinalysis is positive for protein, blood and leukocytes.  Will send for culture.  Vaginitis-she is feeling  better after doing an over-the-counter Monistat treatment.  Wet prep performed today we will call with results once available.  Meds ordered this encounter  Medications   metroNIDAZOLE (FLAGYL) 500 MG tablet    Sig: Take 1 tablet (500 mg total) by mouth 2 (two) times daily for 7 days.    Dispense:  14 tablet    Refill:  0    No follow-ups on file.  Beatrice Lecher, MD

## 2022-03-11 LAB — WET PREP FOR TRICH, YEAST, CLUE
MICRO NUMBER:: 14303297
Specimen Quality: ADEQUATE

## 2022-03-11 LAB — URINE CULTURE
MICRO NUMBER:: 14303298
SPECIMEN QUALITY:: ADEQUATE

## 2022-03-11 MED ORDER — METRONIDAZOLE 500 MG PO TABS: 500.0000 mg | ORAL_TABLET | Freq: Two times a day (BID) | ORAL | 0 refills | Status: AC

## 2022-03-11 NOTE — Progress Notes (Signed)
Hi Leanne, interestingly they did not see yeast though I am still strongly suspicious that that is what was going on and the over-the-counter treatment may have just been clearing it up.  But it does show a few clue cells some get a send over prescription for metronidazole.

## 2022-03-11 NOTE — Addendum Note (Signed)
Addended by: Beatrice Lecher D on: 03/11/2022 07:51 AM   Modules accepted: Orders

## 2022-03-12 NOTE — Progress Notes (Signed)
HI Valerya, Your urine culture is negative.  No UTI.

## 2022-04-13 LAB — HM DIABETES EYE EXAM

## 2022-04-27 ENCOUNTER — Encounter: Payer: Self-pay | Admitting: Family Medicine

## 2022-05-04 ENCOUNTER — Encounter: Payer: Self-pay | Admitting: Family Medicine

## 2022-05-04 ENCOUNTER — Ambulatory Visit: Payer: BC Managed Care – PPO | Admitting: Family Medicine

## 2022-05-04 VITALS — BP 116/77 | HR 92 | Ht 66.0 in | Wt 275.0 lb

## 2022-05-04 DIAGNOSIS — E119 Type 2 diabetes mellitus without complications: Secondary | ICD-10-CM

## 2022-05-04 DIAGNOSIS — E538 Deficiency of other specified B group vitamins: Secondary | ICD-10-CM | POA: Diagnosis not present

## 2022-05-04 DIAGNOSIS — G4733 Obstructive sleep apnea (adult) (pediatric): Secondary | ICD-10-CM | POA: Diagnosis not present

## 2022-05-04 DIAGNOSIS — E559 Vitamin D deficiency, unspecified: Secondary | ICD-10-CM

## 2022-05-04 LAB — POCT GLYCOSYLATED HEMOGLOBIN (HGB A1C): Hemoglobin A1C: 10 % — AB (ref 4.0–5.6)

## 2022-05-04 LAB — POCT UA - MICROALBUMIN
Albumin/Creatinine Ratio, Urine, POC: <30
Creatinine, POC: 20 mg/dL
Microalbumin Ur, POC: 30 mg/L

## 2022-05-04 MED ORDER — SEMAGLUTIDE (2 MG/DOSE) 8 MG/3ML ~~LOC~~ SOPN: 2.0000 mg | PEN_INJECTOR | SUBCUTANEOUS | 1 refills | Status: DC

## 2022-05-04 NOTE — Assessment & Plan Note (Signed)
Will call to see if we can get a download from her CPAP.    CPAP compliance report from May 06, 2022 showing usage of 25 out of 30 days and days with usage greater than 4 hours 83% but that is only because she did not wear it for 5 days.  Mean pressure was 6 and average pressure was 7.2 cm of water pressure.  The highest pressure was 7.1 on average and the lowest was 5.1 on average.  AHI of 0.2.  Will set machine to 7 cm of water pressure and then see how she is doing and try to get a download in about 1 month.

## 2022-05-04 NOTE — Assessment & Plan Note (Signed)
Uncontrolled.  Hemoglobin A1c up to 10.0 today.  She was without her semaglutide for most 3 months and it was over the holidays we discussed getting back on track she is already back on the 1 mg dose and has been on it for about a week so far.  She is can gradually work back up to 2 mg.  Plan to follow back up in 3 months to make sure that we are back on track.

## 2022-05-04 NOTE — Progress Notes (Signed)
Established Patient Office Visit  Subjective   Patient ID: Kaitlyn Kirby, female    DOB: 01/10/1971  Age: 52 y.o. MRN: [id number]  Chief Complaint  Patient presents with   Diabetes    HPI  Diabetes - no hypoglycemic events. No wounds or sores that are not healing well. No increased thirst or urination. Checking glucose at home. Taking medications as prescribed without any side effects.  F/U OSA - she is still set on autopap.  Have tried to reach out multiple times to the company to try to get a copy of her CPAP report so that we can set her pressure more consistently and just Auto[employer].  She would like to consider switching companies if possible  Follow-up bariatric surgery-last saw Dr. Volanda Napoleon 8 or 9 months ago.  At that time her B12 and vitamin D were low.  She did try taking some over-the-counter supplements for a while but had difficulty remember taking them consistently.  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  The one on the near the clavicle ended up requiring a little bit more extensive surgery.    ROS    Objective:     BP 116/77   Pulse 92   Ht '5\' 6"'$  (1.676 m)   Wt 275 lb (124.7 kg)   SpO2 97%   BMI 44.39 kg/m    Physical Exam Vitals and nursing note reviewed.  Constitutional:      Appearance: She is well-developed.  HENT:     Head: Normocephalic and atraumatic.  Cardiovascular:     Rate and Rhythm: Normal rate and regular rhythm.     Heart sounds: Normal heart sounds.  Pulmonary:     Effort: Pulmonary effort is normal.     Breath sounds: Normal breath sounds.  Skin:    General: Skin is warm and dry.  Neurological:     Mental Status: She is alert and oriented to person, place, and time.  Psychiatric:        Behavior: Behavior normal.      Results for orders placed or performed in visit on 05/04/22  POCT glycosylated hemoglobin (Hb A1C)  Result Value Ref Range    Hemoglobin A1C 10.0 (A) 4.0 - 5.6 %   HbA1c POC (<> result, manual entry)     HbA1c, POC (prediabetic range)     HbA1c, POC (controlled diabetic range)    POCT UA - Microalbumin  Result Value Ref Range   Microalbumin Ur, POC 30 mg/L   Creatinine, POC 20 mg/dL   Albumin/Creatinine Ratio, Urine, POC <30       The 10-year ASCVD risk score (Arnett DK, et al., 2019) is: 3.3%* (Cholesterol units were assumed)    Assessment & Plan:   Problem List Items Addressed This Visit       Respiratory   OSA (obstructive sleep apnea)    Will call to see if we can get a download from her CPAP.          Endocrine   Controlled type 2 diabetes mellitus ([place]) - Primary    Uncontrolled.  Hemoglobin A1c up to 10.0 today.  She was without her semaglutide for most 3 months and it was over the holidays we discussed getting back on track she is already back on the 1 mg dose and has been on it for about a week so far.  She is can  gradually work back up to 2 mg.  Plan to follow back up in 3 months to make sure that we are back on track.      Relevant Medications   Semaglutide, 2 MG/DOSE, 8 MG/3ML SOPN   Other Relevant Orders   POCT glycosylated hemoglobin (Hb A1C) (Completed)   POCT UA - Microalbumin (Completed)     Other   Vitamin D deficiency   Relevant Orders   VITAMIN D 25 Hydroxy (Vit-D Deficiency, Fractures)   B12 deficiency   Relevant Orders   B12   We check for vitamin B12 and vitamin D deficiencies.  Will call once we have those levels available.   Return in about 3 months (around 08/02/2022) for Diabetes follow-up.    Beatrice Lecher, MD

## 2022-05-05 LAB — VITAMIN D 25 HYDROXY (VIT D DEFICIENCY, FRACTURES): Vit D, 25-Hydroxy: 26 ng/mL — ABNORMAL LOW (ref 30–100)

## 2022-05-05 LAB — VITAMIN B12: Vitamin B-12: 215 pg/mL (ref 200–1100)

## 2022-05-05 NOTE — Progress Notes (Signed)
Hi Kaitlyn Kirby,   Your vitamin D is coming up nicely up to 26 so continue with your supplement.  Your B12 is actually trended a little upward as well.  But if you would like to consider switching to the B12 injections we can do that as well.  If you do then we will just schedule you on the nurse visit monthly for 6 months and then recheck your level.

## 2022-05-06 ENCOUNTER — Telehealth: Payer: Self-pay | Admitting: *Deleted

## 2022-05-06 NOTE — Telephone Encounter (Signed)
Called and requested a download of pt's CPAP to be faxed. Spoke w/Brittney at [employer] she is going to fax over a 30 day download.

## 2022-05-07 ENCOUNTER — Telehealth: Payer: Self-pay | Admitting: Family Medicine

## 2022-05-07 DIAGNOSIS — G4733 Obstructive sleep apnea (adult) (pediatric): Secondary | ICD-10-CM

## 2022-05-07 MED ORDER — AMBULATORY NON FORMULARY MEDICATION: 0 refills | Status: DC

## 2022-05-07 NOTE — Telephone Encounter (Signed)
CPAP compliance report from May 06, 2022 showing usage of 25 out of 30 days and days with usage greater than 4 hours 83% but that is only because she did not wear it for 5 days.  Mean pressure was 6 and average pressure was 7.2 cm of water pressure.  The highest pressure was 7.1 on average and the lowest was 5.1 on average.  AHI of 0.2.  Will set machine to 7 cm of water pressure and then see how she is doing and try to get a download in about 1 month.

## 2022-05-08 ENCOUNTER — Ambulatory Visit (INDEPENDENT_AMBULATORY_CARE_PROVIDER_SITE_OTHER): Payer: BC Managed Care – PPO | Admitting: Family Medicine

## 2022-05-08 VITALS — BP 110/84 | HR 85 | Temp 98.2°F | Ht 66.0 in | Wt 276.0 lb

## 2022-05-08 DIAGNOSIS — E538 Deficiency of other specified B group vitamins: Secondary | ICD-10-CM | POA: Diagnosis not present

## 2022-05-08 MED ORDER — CYANOCOBALAMIN 1000 MCG/ML IJ SOLN
1000.0000 ug | Freq: Once | INTRAMUSCULAR | Status: AC
  Administered 2022-05-08: 1000 ug via INTRAMUSCULAR

## 2022-05-08 NOTE — Progress Notes (Signed)
   Subjective:    Patient ID: Kaitlyn Kirby, female    DOB: 03/06/1971, 52 y.o.   MRN: [id number]  HPI Pt here for B12 injection, pt denies any muscle cramps, weakness or irregular heart rate.   Review of Systems     Objective:   Physical Exam        Assessment & Plan:  Pt tolerated injection in left deltoid well without any complications, pt advised to schedule next injection in 1 month.

## 2022-05-08 NOTE — Progress Notes (Signed)
Agree with documentation as above.   Melayah Skorupski, MD  

## 2022-05-28 ENCOUNTER — Other Ambulatory Visit: Payer: Self-pay | Admitting: Family Medicine

## 2022-05-28 DIAGNOSIS — Z1231 Encounter for screening mammogram for malignant neoplasm of breast: Secondary | ICD-10-CM

## 2022-06-05 ENCOUNTER — Ambulatory Visit (INDEPENDENT_AMBULATORY_CARE_PROVIDER_SITE_OTHER): Payer: BC Managed Care – PPO | Admitting: Family Medicine

## 2022-06-05 VITALS — BP 119/75 | HR 88

## 2022-06-05 DIAGNOSIS — E538 Deficiency of other specified B group vitamins: Secondary | ICD-10-CM

## 2022-06-05 MED ORDER — CYANOCOBALAMIN 1000 MCG/ML IJ SOLN
1000.0000 ug | Freq: Once | INTRAMUSCULAR | Status: AC
  Administered 2022-06-05: 1000 ug via INTRAMUSCULAR

## 2022-06-05 NOTE — Progress Notes (Signed)
Pt came in today for B12 injection.   Injection  tolerated well. Given in LD.  Pt reports no negative side effects from medication. Denies any dizziness, chest pain or palpitations, and no GI problems..  Pt to RTC in 1 month for next injection.

## 2022-07-01 ENCOUNTER — Ambulatory Visit (INDEPENDENT_AMBULATORY_CARE_PROVIDER_SITE_OTHER): Payer: BC Managed Care – PPO

## 2022-07-01 DIAGNOSIS — Z1231 Encounter for screening mammogram for malignant neoplasm of breast: Secondary | ICD-10-CM | POA: Diagnosis not present

## 2022-07-02 NOTE — Progress Notes (Signed)
Please call patient. Normal mammogram.  Repeat in 1 year.  

## 2022-07-03 ENCOUNTER — Ambulatory Visit (INDEPENDENT_AMBULATORY_CARE_PROVIDER_SITE_OTHER): Payer: BC Managed Care – PPO | Admitting: Family Medicine

## 2022-07-03 VITALS — BP 119/77 | HR 99 | Ht 66.0 in

## 2022-07-03 DIAGNOSIS — E538 Deficiency of other specified B group vitamins: Secondary | ICD-10-CM

## 2022-07-03 MED ORDER — CYANOCOBALAMIN 1000 MCG/ML IJ SOLN
1000.0000 ug | Freq: Once | INTRAMUSCULAR | Status: AC
  Administered 2022-07-03: 1000 ug via INTRAMUSCULAR

## 2022-07-03 NOTE — Progress Notes (Signed)
   Established Patient Office Visit  Subjective   Patient ID: QUINTELLA BROZOWSKI, female    DOB: 1971/01/13  Age: 52 y.o. MRN: [id number]  Chief Complaint  Patient presents with   B12 deficiency    Nurse visit B12 injection.    HPI  Vitamin B12 deficiency.  Nurse visit for B12 injection. Patient denies GI problems, dizziness, weakness,  irregular heart rate or medication problems.   ROS    Objective:     BP 119/77   Pulse 99   Ht 5\' 6"  (1.676 m)   SpO2 99%   BMI 44.55 kg/m    Physical Exam   No results found for any visits on 07/03/22.    The 10-year ASCVD risk score (Arnett DK, et al., 2019) is: 3.5%* (Cholesterol units were assumed)    Assessment & Plan:  Vitamin B12 injection. Nurse visit. Administered 1,041mcg IM Left deltoid(patient requesting injection in left arm today ) . Patient tolerated injection well without complications.  Problem List Items Addressed This Visit   None   No follow-ups on file.    Elizabeth Palau, LPN

## 2022-07-03 NOTE — Progress Notes (Signed)
Medical screening examination/treatment was performed by qualified clinical staff member and as supervising physician I was immediately available for consultation/collaboration. I have reviewed documentation and agree with assessment and plan.  Marchella Hibbard, DO  

## 2022-07-03 NOTE — Patient Instructions (Signed)
Return in 30 days for next B12 injection.

## 2022-08-03 ENCOUNTER — Ambulatory Visit: Payer: BC Managed Care – PPO | Admitting: Family Medicine

## 2022-08-03 ENCOUNTER — Encounter: Payer: Self-pay | Admitting: Family Medicine

## 2022-08-03 VITALS — BP 115/74 | HR 68 | Ht 66.0 in | Wt 273.0 lb

## 2022-08-03 DIAGNOSIS — E119 Type 2 diabetes mellitus without complications: Secondary | ICD-10-CM

## 2022-08-03 DIAGNOSIS — F3341 Major depressive disorder, recurrent, in partial remission: Secondary | ICD-10-CM

## 2022-08-03 DIAGNOSIS — G4733 Obstructive sleep apnea (adult) (pediatric): Secondary | ICD-10-CM

## 2022-08-03 DIAGNOSIS — E538 Deficiency of other specified B group vitamins: Secondary | ICD-10-CM

## 2022-08-03 DIAGNOSIS — Z794 Long term (current) use of insulin: Secondary | ICD-10-CM

## 2022-08-03 LAB — POCT GLYCOSYLATED HEMOGLOBIN (HGB A1C): Hemoglobin A1C: 6.9 % — AB (ref 4.0–5.6)

## 2022-08-03 MED ORDER — SEMAGLUTIDE (2 MG/DOSE) 8 MG/3ML ~~LOC~~ SOPN
2.0000 mg | PEN_INJECTOR | SUBCUTANEOUS | 1 refills | Status: DC
Start: 2022-08-03 — End: 2023-01-04

## 2022-08-03 MED ORDER — CYANOCOBALAMIN 1000 MCG/ML IJ SOLN
1000.0000 ug | Freq: Once | INTRAMUSCULAR | Status: AC
  Administered 2022-08-03: 1000 ug via INTRAMUSCULAR

## 2022-08-03 MED ORDER — VENLAFAXINE HCL ER 37.5 MG PO CP24: 37.5000 mg | ORAL_CAPSULE | Freq: Every day | ORAL | 1 refills | Status: DC

## 2022-08-03 NOTE — Assessment & Plan Note (Signed)
She is back on track and doing well on the 2 mg Ozempic.  A1c is now down to 6.9.  Continue to work on healthy food choices regular exercise and weight loss.

## 2022-08-03 NOTE — Assessment & Plan Note (Deleted)
Well controlled. Continue current regimen. Follow up in  4 mo 

## 2022-08-03 NOTE — Progress Notes (Signed)
Pt came in today for B12 injection. Injection given in RD and tolerated well.  Pt reports no negative side effects from medication. Denies any dizziness, chest pain or palpitations, and no GI problems. She will RTC in 1 month for next injection

## 2022-08-03 NOTE — Assessment & Plan Note (Addendum)
Really happy with her oral appliance for her sleep apnea.

## 2022-08-03 NOTE — Assessment & Plan Note (Signed)
Would like to stay on the medication until she retires in December.  Will likely come off after that. Feels like most of her stress is form work.

## 2022-08-03 NOTE — Progress Notes (Signed)
Established Patient Office Visit  Subjective   Patient ID: Kaitlyn Kirby, female    DOB: 10-17-1970  Age: 52 y.o. MRN: [id number]  Chief Complaint  Patient presents with   Diabetes   B12 Injection    HPI  Diabetes - no hypoglycemic events. No wounds or sores that are not healing well. No increased thirst or urination. Checking glucose at home. Taking medications as prescribed without any side effects.  B12 - def - due for shot today.   She is doing really well overall.  Obstructive sleep apnea-she is using the mouthguard now instead of the CPAP and doing really well. She did a consult withDr[facility]  She says it took about a month to get used to it and had some tenderness and soreness initially.  But she is doing really well with that is very happy with she likes it better than the CPAP.    ROS    Objective:     BP 115/74   Pulse 68   Ht 5\' 6"  (1.676 m)   Wt 273 lb (123.8 kg)   SpO2 97%   BMI 44.06 kg/m    Physical Exam Vitals and nursing note reviewed.  Constitutional:      Appearance: She is well-developed.  HENT:     Head: Normocephalic and atraumatic.  Cardiovascular:     Rate and Rhythm: Normal rate and regular rhythm.     Heart sounds: Normal heart sounds.  Pulmonary:     Effort: Pulmonary effort is normal.     Breath sounds: Normal breath sounds.  Skin:    General: Skin is warm and dry.  Neurological:     Mental Status: She is alert and oriented to person, place, and time.  Psychiatric:        Behavior: Behavior normal.     Results for orders placed or performed in visit on 08/03/22  POCT glycosylated hemoglobin (Hb A1C)  Result Value Ref Range   Hemoglobin A1C 6.9 (A) 4.0 - 5.6 %   HbA1c POC (<> result, manual entry)     HbA1c, POC (prediabetic range)     HbA1c, POC (controlled diabetic range)        The 10-year ASCVD risk score (Arnett DK, et al., 2019) is: 3.3%* (Cholesterol units were assumed)    Assessment  & Plan:   Problem List Items Addressed This Visit       Respiratory   OSA (obstructive sleep apnea)    Really happy with her oral appliance for her sleep apnea.        Endocrine   Controlled type 2 diabetes mellitus ([facility]) - Primary    She is back on track and doing well on the 2 mg Ozempic.  A1c is now down to 6.9.  Continue to work on healthy food choices regular exercise and weight loss.      Relevant Medications   Semaglutide, 2 MG/DOSE, 8 MG/3ML SOPN   Other Relevant Orders   POCT glycosylated hemoglobin (Hb A1C) (Completed)     Other   Depression    Would like to stay on the medication until she retires in December.  Will likely come off after that. Feels like most of her stress is form work.       Relevant Medications   venlafaxine XR (EFFEXOR-XR) 37.5 MG 24 hr capsule   B12 deficiency    Return in about 17 weeks (around 11/30/2022) for Diabetes follow-up.    Santina Evans  Madilyn Fireman, MD

## 2022-08-04 ENCOUNTER — Other Ambulatory Visit: Payer: Self-pay | Admitting: Family Medicine

## 2022-08-04 DIAGNOSIS — F3341 Major depressive disorder, recurrent, in partial remission: Secondary | ICD-10-CM

## 2022-08-31 ENCOUNTER — Ambulatory Visit: Payer: BC Managed Care – PPO | Admitting: Family Medicine

## 2022-08-31 VITALS — BP 106/67 | HR 72

## 2022-08-31 DIAGNOSIS — E538 Deficiency of other specified B group vitamins: Secondary | ICD-10-CM

## 2022-08-31 MED ORDER — CYANOCOBALAMIN 1000 MCG/ML IJ SOLN
1000.0000 ug | Freq: Once | INTRAMUSCULAR | Status: AC
Start: 2022-08-31 — End: 2022-08-31
  Administered 2022-08-31: 1000 ug via INTRAMUSCULAR

## 2022-08-31 NOTE — Progress Notes (Signed)
Agree with documentation as above.   Adrean Findlay, MD  

## 2022-08-31 NOTE — Progress Notes (Signed)
   Established Patient Office Visit  Subjective   Patient ID: Kaitlyn Kirby, female    DOB: [birth year]  Age: 52 y.o. MRN: [id number]  Chief Complaint  Patient presents with   Pernicious Anemia    HPI  Kaitlyn Kirby is here for a vitamin B 12 injection. Denies muscle cramps, weakness or irregular heart rate.   ROS    Objective:     BP 106/67   Pulse 72   SpO2 99%    Physical Exam   No results found for any visits on 08/31/22.    The 10-year ASCVD risk score (Arnett DK, et al., 2019) is: 2.8%* (Cholesterol units were assumed)    Assessment & Plan:  B12 injection - Patient tolerated injection well without complications. Patient advised to schedule next injection 30 days from today.     Problem List Items Addressed This Visit       Unprioritized   B12 deficiency - Primary    Return in about 1 month (around 09/30/2022) for B12 injection/aht.    Esmond Harps, CMA

## 2022-09-29 ENCOUNTER — Telehealth: Payer: Self-pay | Admitting: Family Medicine

## 2022-09-29 ENCOUNTER — Ambulatory Visit (INDEPENDENT_AMBULATORY_CARE_PROVIDER_SITE_OTHER): Payer: BC Managed Care – PPO | Admitting: Family Medicine

## 2022-09-29 VITALS — BP 128/77 | HR 94

## 2022-09-29 DIAGNOSIS — R059 Cough, unspecified: Secondary | ICD-10-CM

## 2022-09-29 DIAGNOSIS — E538 Deficiency of other specified B group vitamins: Secondary | ICD-10-CM

## 2022-09-29 MED ORDER — CYANOCOBALAMIN 1000 MCG/ML IJ SOLN
1000.0000 ug | Freq: Once | INTRAMUSCULAR | Status: AC
Start: 2022-09-29 — End: 2022-09-29
  Administered 2022-09-29: 1000 ug via INTRAMUSCULAR

## 2022-09-29 NOTE — Progress Notes (Signed)
   Established Patient Office Visit  Subjective   Patient ID: Kaitlyn Kirby, female    DOB: January 10, 1971  Age: 53 y.o. MRN: [id number]  Chief Complaint  Patient presents with   Pernicious Anemia    HPI  Kaitlyn Kirby is here for a vitamin B 12 injection. Denies muscle cramps, weakness or irregular heart rate.   ROS    Objective:     BP 128/77   Pulse 94   SpO2 99%    Physical Exam   No results found for any visits on 09/29/22.    The 10-year ASCVD risk score (Arnett DK, et al., 2019) is: 4%* (Cholesterol units were assumed)    Assessment & Plan:  B12 injection - Patient tolerated injection well without complications. Patient advised to have B12 labs in 1 month.   Problem List Items Addressed This Visit       Unprioritized   B12 deficiency - Primary   Relevant Orders   B12    Return in about 1 month (around 10/30/2022) for lab draw to check B12. Earna Coder, Janalyn Harder, CMA

## 2022-09-29 NOTE — Progress Notes (Signed)
Agree with documentation as above.   Redonna Wilbert, MD  

## 2022-09-29 NOTE — Telephone Encounter (Unsigned)
Pt needs a Albuterol refill sent to [facility] [address] [place] (not the one in [employer])

## 2022-09-30 MED ORDER — ALBUTEROL SULFATE HFA 108 (90 BASE) MCG/ACT IN AERS
1.0000 | INHALATION_SPRAY | RESPIRATORY_TRACT | 99 refills | Status: DC | PRN
Start: 2022-09-30 — End: 2023-10-25

## 2022-09-30 NOTE — Telephone Encounter (Signed)
Rx sent to [facility] [address]. Per patients request

## 2022-11-04 ENCOUNTER — Encounter: Payer: Self-pay | Admitting: Family Medicine

## 2022-11-04 ENCOUNTER — Ambulatory Visit: Payer: BC Managed Care – PPO | Admitting: Family Medicine

## 2022-11-04 VITALS — BP 110/67 | HR 93 | Ht 66.0 in | Wt 276.0 lb

## 2022-11-04 DIAGNOSIS — F3341 Major depressive disorder, recurrent, in partial remission: Secondary | ICD-10-CM | POA: Diagnosis not present

## 2022-11-04 DIAGNOSIS — E559 Vitamin D deficiency, unspecified: Secondary | ICD-10-CM

## 2022-11-04 DIAGNOSIS — E785 Hyperlipidemia, unspecified: Secondary | ICD-10-CM

## 2022-11-04 DIAGNOSIS — E538 Deficiency of other specified B group vitamins: Secondary | ICD-10-CM

## 2022-11-04 DIAGNOSIS — N951 Menopausal and female climacteric states: Secondary | ICD-10-CM | POA: Insufficient documentation

## 2022-11-04 DIAGNOSIS — E119 Type 2 diabetes mellitus without complications: Secondary | ICD-10-CM | POA: Diagnosis not present

## 2022-11-04 DIAGNOSIS — Z7985 Long-term (current) use of injectable non-insulin antidiabetic drugs: Secondary | ICD-10-CM

## 2022-11-04 LAB — POCT GLYCOSYLATED HEMOGLOBIN (HGB A1C): Hemoglobin A1C: 7.4 % — AB (ref 4.0–5.6)

## 2022-11-04 MED ORDER — CYANOCOBALAMIN 1000 MCG/ML IJ SOLN
1000.0000 ug | Freq: Once | INTRAMUSCULAR | Status: AC
Start: 2022-11-04 — End: 2022-11-04
  Administered 2022-11-04: 1000 ug via INTRAMUSCULAR

## 2022-11-04 MED ORDER — VENLAFAXINE HCL ER 37.5 MG PO CP24
37.5000 mg | ORAL_CAPSULE | Freq: Every day | ORAL | 0 refills | Status: DC
Start: 2022-11-04 — End: 2023-02-02

## 2022-11-04 NOTE — Assessment & Plan Note (Signed)
Recently told vitamin D was low.  Some labs drawn at [facility].  They also told her that she was dehydrated and that she might benefit from IV fluids.  She says most days she drinks about a total of 50 ounces.

## 2022-11-04 NOTE — Assessment & Plan Note (Signed)
Sxs controlled.

## 2022-11-04 NOTE — Assessment & Plan Note (Signed)
Low-dose venlafaxine which can also help with hot flashes.

## 2022-11-04 NOTE — Progress Notes (Signed)
Established Patient Office Visit  Subjective   Patient ID: Kaitlyn Kirby, female    DOB: July 23, 1970  Age: 52 y.o. MRN: [id number]  No chief complaint on file.   HPI  Diabetes - no hypoglycemic events. No wounds or sores that are not healing well. No increased thirst or urination. Checking glucose at home. Taking medications as prescribed without any side effects.  F/U MDD -doing well overall.  Still having some mild depression symptoms.  She is actually getting ready to go on a cruise to [place].  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   Still struggling with perimenopausal symptoms.      ROS    Objective:     BP 110/67   Pulse 93   Ht 5\' 6"  (1.676 m)   Wt 276 lb (125.2 kg)   SpO2 99%   BMI 44.55 kg/m    Physical Exam Vitals and nursing note reviewed.  Constitutional:      Appearance: She is well-developed.  HENT:     Head: Normocephalic and atraumatic.  Cardiovascular:     Rate and Rhythm: Normal rate and regular rhythm.     Heart sounds: Normal heart sounds.  Pulmonary:     Effort: Pulmonary effort is normal.     Breath sounds: Normal breath sounds.  Skin:    General: Skin is warm and dry.  Neurological:     Mental Status: She is alert and oriented to person, place, and time.  Psychiatric:        Behavior: Behavior normal.      Results for orders placed or performed in visit on 11/04/22  POCT glycosylated hemoglobin (Hb A1C)  Result Value Ref Range   Hemoglobin A1C 7.4 (A) 4.0 - 5.6 %   HbA1c POC (<> result, manual entry)     HbA1c, POC (prediabetic range)     HbA1c, POC (controlled diabetic range)        The 10-year ASCVD risk score (Arnett DK, et al., 2019) is: 3%* (Cholesterol units were assumed)    Assessment & Plan:   Problem List Items Addressed This Visit       Endocrine   Controlled type 2 diabetes mellitus (HCC) - Primary    A1C jumped up over 7 today.  But she reports that she has been eating more carbs and getting  into ice cream over the summer.  That she has been walking more consistently which is fantastic.  Just encouraged her to get back on track with her diet over the next 3 months and we will see if we can get the A1c back under 7.  If not we will need to consider adding a medication to the semaglutide.  She is also interested in switching to [facility] if her insurance will cover it.  When she gets back from her trip she will check into it and let us know.      Relevant Orders   POCT glycosylated hemoglobin (Hb A1C) (Completed)     Other   Vitamin D deficiency    Recently told vitamin D was low.  Some labs drawn at [facility].  They also told her that she was dehydrated and that she might benefit from IV fluids.  She says most days she drinks about a total of 50 ounces.      Perimenopausal symptoms    Low-dose venlafaxine which can also help with hot flashes.      Hyperlipidemia   Depression  Sxs controlled.        Relevant Medications   venlafaxine XR (EFFEXOR-XR) 37.5 MG 24 hr capsule   B12 deficiency   Relevant Orders   B12   D deficiency-recommend starting with 25 to 50 mcg daily and then rechecking these labs in 8 to 12 weeks.    Return in about 3 months (around 02/11/2023) for Diabetes follow-up.    Nani Gasser, MD

## 2022-11-04 NOTE — Assessment & Plan Note (Signed)
A1C jumped up over 7 today.  But she reports that she has been eating more carbs and getting into ice cream over the summer.  That she has been walking more consistently which is fantastic.  Just encouraged her to get back on track with her diet over the next 3 months and we will see if we can get the A1c back under 7.  If not we will need to consider adding a medication to the semaglutide.  She is also interested in switching to [facility] if her insurance will cover it.  When she gets back from her trip she will check into it and let us know.

## 2022-11-04 NOTE — Progress Notes (Signed)
Pt came in today for B12 injection.   Injection given in LD she tolerated well.  Pt reports no negative side effects from medication. Denies any dizziness, chest pain or palpitations, and no GI problems.  She had B12 lab drawn before receiving her injection today. Pt will be notified if she will need to continue to get monthly injections.

## 2022-11-05 NOTE — Progress Notes (Signed)
Kaitlyn Kirby, B12 levels have improved greatly.  It was 215 6 months ago it is up to 284 which is back into the normal range which is fantastic.  I would like to get you a little closer to the middle of the range which is around 500-600.  So lets continue with the B12 shots.  I think you are coming in monthly so that would be perfect.  Then we can plan to recheck your level again in about 4 months.

## 2022-11-18 ENCOUNTER — Other Ambulatory Visit: Payer: Self-pay | Admitting: Family Medicine

## 2022-12-09 ENCOUNTER — Ambulatory Visit (INDEPENDENT_AMBULATORY_CARE_PROVIDER_SITE_OTHER): Payer: BC Managed Care – PPO

## 2022-12-09 VITALS — BP 125/80 | HR 89 | Ht 66.0 in

## 2022-12-09 DIAGNOSIS — E538 Deficiency of other specified B group vitamins: Secondary | ICD-10-CM | POA: Diagnosis not present

## 2022-12-09 MED ORDER — CYANOCOBALAMIN 1000 MCG/ML IJ SOLN
1000.0000 ug | Freq: Once | INTRAMUSCULAR | Status: AC
Start: 2022-12-09 — End: 2022-12-09
  Administered 2022-12-09: 1000 ug via INTRAMUSCULAR

## 2022-12-09 NOTE — Patient Instructions (Signed)
return in 30 days for B12 injection as nurse visit.

## 2022-12-09 NOTE — Progress Notes (Signed)
   Established Patient Office Visit  Subjective   Patient ID: Kaitlyn Kirby, female    DOB: [birth year]  Age: 52 y.o. MRN: [id number]  Chief Complaint  Patient presents with   B12 deficiency    Nurse visit B12 deficiency.    HPI  B12 deficiency. Nurse visit. Patient denies irregular heart rate and GI problems.   ROS    Objective:     BP 125/80   Pulse 89   Ht 5\' 6"  (1.676 m)   SpO2 100%   BMI 44.55 kg/m    Physical Exam   No results found for any visits on 12/09/22.    The 10-year ASCVD risk score (Arnett DK, et al., 2019) is: 3.8%* (Cholesterol units were assumed)    Assessment & Plan:  B12 injection admin 1,036mcg IM Right deltoid. Patient tolerated injection well without complications. Return in 30 days for next B12 injection.  Problem List Items Addressed This Visit       Other   B12 deficiency - Primary    Return for return in 30 days for B12 injection as nurse visit. Elizabeth Palau, LPN

## 2023-01-03 ENCOUNTER — Other Ambulatory Visit: Payer: Self-pay | Admitting: Family Medicine

## 2023-01-03 DIAGNOSIS — E119 Type 2 diabetes mellitus without complications: Secondary | ICD-10-CM

## 2023-01-30 ENCOUNTER — Other Ambulatory Visit: Payer: Self-pay | Admitting: Family Medicine

## 2023-01-30 DIAGNOSIS — F3341 Major depressive disorder, recurrent, in partial remission: Secondary | ICD-10-CM

## 2023-02-10 ENCOUNTER — Ambulatory Visit (INDEPENDENT_AMBULATORY_CARE_PROVIDER_SITE_OTHER): Payer: BC Managed Care – PPO | Admitting: Family Medicine

## 2023-02-10 ENCOUNTER — Encounter: Payer: Self-pay | Admitting: Family Medicine

## 2023-02-10 VITALS — BP 112/72 | HR 84 | Ht 66.0 in | Wt 268.0 lb

## 2023-02-10 DIAGNOSIS — J40 Bronchitis, not specified as acute or chronic: Secondary | ICD-10-CM

## 2023-02-10 DIAGNOSIS — E119 Type 2 diabetes mellitus without complications: Secondary | ICD-10-CM | POA: Diagnosis not present

## 2023-02-10 DIAGNOSIS — J329 Chronic sinusitis, unspecified: Secondary | ICD-10-CM

## 2023-02-10 DIAGNOSIS — E538 Deficiency of other specified B group vitamins: Secondary | ICD-10-CM | POA: Diagnosis not present

## 2023-02-10 DIAGNOSIS — F3341 Major depressive disorder, recurrent, in partial remission: Secondary | ICD-10-CM

## 2023-02-10 DIAGNOSIS — E559 Vitamin D deficiency, unspecified: Secondary | ICD-10-CM

## 2023-02-10 DIAGNOSIS — Z7689 Persons encountering health services in other specified circumstances: Secondary | ICD-10-CM

## 2023-02-10 DIAGNOSIS — Z7985 Long-term (current) use of injectable non-insulin antidiabetic drugs: Secondary | ICD-10-CM

## 2023-02-10 LAB — POCT GLYCOSYLATED HEMOGLOBIN (HGB A1C): Hemoglobin A1C: 6.9 % — AB (ref 4.0–5.6)

## 2023-02-10 MED ORDER — PREDNISONE 20 MG PO TABS: 40.0000 mg | ORAL_TABLET | Freq: Every day | ORAL | 0 refills | Status: DC

## 2023-02-10 MED ORDER — AZITHROMYCIN 250 MG PO TABS
ORAL_TABLET | ORAL | 0 refills | Status: AC
Start: 2023-02-10 — End: 2023-02-15

## 2023-02-10 NOTE — Assessment & Plan Note (Signed)
Stable on venlafaxine 90-day refill sent to pharmacy earlier this month continue current regimen.

## 2023-02-10 NOTE — Assessment & Plan Note (Signed)
A1c is awhich is great A1c is at 6.9 today which is improved from previous which is fantastic.  Just encouraged her to continue to work on healthy food choices regular exercise.  She is really making great progress which is fantastic.  Continue statin.  Lab Results  Component Value Date   HGBA1C 6.9 (A) 02/10/2023

## 2023-02-10 NOTE — Progress Notes (Signed)
Acute Office Visit  Subjective:     Patient ID: Kaitlyn Kirby, female    DOB: [birth year], 52 y.o.   MRN: [id number]  Chief Complaint  Patient presents with   Shortness of Breath    Wheezing     HPI Patient is in today for cough and wheezing.  She says she started feeling bad about 2 weeks ago initially it was more nasal congestion and pressure it eventually has now tracked into her chest and now she feels really tight and short of breath she says even yesterday was going up 1 flight of stairs it was actually really hard to breathe.  The cough has been somewhat productive.  No recent fevers or chills she is mostly been relying on Mucinex.  She has been also using her inhaler.  Diabetes - no hypoglycemic events. No wounds or sores that are not healing well. No increased thirst or urination. Checking glucose at home. Taking medications as prescribed without any side effects.  Now on 2 mg of semaglutide.  She is down about 8 pounds since she was here in August she has tried to come up some with some creative ways to have snacks that for feel her hunger needs but are more low calorie and higher in protein.   ROS      Objective:    BP 112/72   Pulse 84   Ht 5\' 6"  (1.676 m)   Wt 268 lb (121.6 kg)   SpO2 96%   BMI 43.26 kg/m    Physical Exam Constitutional:      Appearance: Normal appearance.  HENT:     Head: Normocephalic and atraumatic.     Comments: Turbinates are swollen bilaterally.    Right Ear: Tympanic membrane, ear canal and external ear normal. There is no impacted cerumen.     Left Ear: Tympanic membrane, ear canal and external ear normal. There is no impacted cerumen.     Nose: Nose normal.     Mouth/Throat:     Pharynx: Oropharynx is clear.  Eyes:     Conjunctiva/sclera: Conjunctivae normal.  Cardiovascular:     Rate and Rhythm: Normal rate and regular rhythm.  Pulmonary:     Effort: Pulmonary effort is normal.     Breath sounds: Wheezing present.      Comments: Mild end inspiratory wheezing bilaterally. Musculoskeletal:     Cervical back: Neck supple. No tenderness.  Lymphadenopathy:     Cervical: No cervical adenopathy.  Skin:    General: Skin is warm and dry.  Neurological:     Mental Status: She is alert and oriented to person, place, and time.  Psychiatric:        Mood and Affect: Mood normal.     Results for orders placed or performed in visit on 02/10/23  POCT HgB A1C  Result Value Ref Range   Hemoglobin A1C 6.9 (A) 4.0 - 5.6 %   HbA1c POC (<> result, manual entry)     HbA1c, POC (prediabetic range)     HbA1c, POC (controlled diabetic range)          Assessment & Plan:   Problem List Items Addressed This Visit       Endocrine   Controlled type 2 diabetes mellitus (HCC) - Primary    A1c is awhich is great A1c is at 6.9 today which is improved from previous which is fantastic.  Just encouraged her to continue to work on healthy food choices regular exercise.  She  is really making great progress which is fantastic.  Continue statin.  Lab Results  Component Value Date   HGBA1C 6.9 (A) 02/10/2023         Relevant Orders   POCT HgB A1C (Completed)   B12   VITAMIN D 25 Hydroxy (Vit-D Deficiency, Fractures)   CMP14+EGFR   Lipid panel   CBC     Other   Vitamin D deficiency   Relevant Orders   B12   VITAMIN D 25 Hydroxy (Vit-D Deficiency, Fractures)   CMP14+EGFR   Lipid panel   CBC   Encounter for weight management    Visit #:3 Starting UXLKGM:010 lbs    Current weight: 268 lbs  Previous weight: 276 lbs  Change in weight: Down 8 lbs  Goal weight: TBD Dietary goals: Continue to portion control she is eating 6 small meals a day, work on increasing vegetable intake Exercise goals: Continue with daily walking and hopefully will be able to add in water aerobics twice a week Medication: Now on Semaglutide 2 mg Follow-up and referrals: 3 mo      Depression    Stable on venlafaxine 90-day refill sent to  pharmacy earlier this month continue current regimen.      B12 deficiency   Relevant Orders   B12   VITAMIN D 25 Hydroxy (Vit-D Deficiency, Fractures)   CMP14+EGFR   Lipid panel   CBC   Other Visit Diagnoses     Sinobronchitis       Relevant Medications   azithromycin (ZITHROMAX) 250 MG tablet   predniSONE (DELTASONE) 20 MG tablet      Sinobronchitis-will treat with azithromycin and prednisone continue to use albuterol as needed.  If not feeling better after the weekend then please let us know.  Meds ordered this encounter  Medications   azithromycin (ZITHROMAX) 250 MG tablet    Sig: 2 Ttabs PO on Day 1, then one a day x 4 days.    Dispense:  6 tablet    Refill:  0   predniSONE (DELTASONE) 20 MG tablet    Sig: Take 2 tablets (40 mg total) by mouth daily with breakfast.    Dispense:  10 tablet    Refill:  0    Return in about 3 months (around 05/27/2023) for Diabetes follow-up, Hypertension.  Nani Gasser, MD

## 2023-02-10 NOTE — Assessment & Plan Note (Signed)
Visit #:3 Starting ZOXWRU:045 lbs    Current weight: 268 lbs  Previous weight: 276 lbs  Change in weight: Down 8 lbs  Goal weight: TBD Dietary goals: Continue to portion control she is eating 6 small meals a day, work on increasing vegetable intake Exercise goals: Continue with daily walking and hopefully will be able to add in water aerobics twice a week Medication: Now on Semaglutide 2 mg Follow-up and referrals: 3 mo

## 2023-02-10 NOTE — Assessment & Plan Note (Signed)
She is continuing to move forward with her weight loss journey and BMI is now down to 43.

## 2023-02-11 ENCOUNTER — Ambulatory Visit: Payer: BC Managed Care – PPO | Admitting: Family Medicine

## 2023-02-11 LAB — LIPID PANEL
Chol/HDL Ratio: 4.6 ratio — ABNORMAL HIGH (ref 0.0–4.4)
Cholesterol, Total: 167 mg/dL (ref 100–199)
HDL: 36 mg/dL — ABNORMAL LOW (ref >39)
LDL Chol Calc (NIH): 97 mg/dL (ref 0–99)
Triglycerides: 198 mg/dL — ABNORMAL HIGH (ref 0–149)
VLDL Cholesterol Cal: 34 mg/dL (ref 5–40)

## 2023-02-11 LAB — VITAMIN D 25 HYDROXY (VIT D DEFICIENCY, FRACTURES): Vit D, 25-Hydroxy: 25 ng/mL — ABNORMAL LOW (ref 30.0–100.0)

## 2023-02-11 LAB — CMP14+EGFR
ALT: 30 [IU]/L (ref 0–32)
AST: 22 [IU]/L (ref 0–40)
Albumin: 4.4 g/dL (ref 3.8–4.9)
Alkaline Phosphatase: 107 [IU]/L (ref 44–121)
BUN/Creatinine Ratio: 14 (ref 9–23)
BUN: 12 mg/dL (ref 6–24)
Bilirubin Total: 1.1 mg/dL (ref 0.0–1.2)
CO2: 25 mmol/L (ref 20–29)
Calcium: 9.8 mg/dL (ref 8.7–10.2)
Chloride: 100 mmol/L (ref 96–106)
Creatinine, Ser: 0.88 mg/dL (ref 0.57–1.00)
Globulin, Total: 2.8 g/dL (ref 1.5–4.5)
Glucose: 119 mg/dL — ABNORMAL HIGH (ref 70–99)
Potassium: 4.8 mmol/L (ref 3.5–5.2)
Sodium: 138 mmol/L (ref 134–144)
Total Protein: 7.2 g/dL (ref 6.0–8.5)
eGFR: 79 mL/min/{1.73_m2} (ref >59)

## 2023-02-11 LAB — CBC
Hematocrit: 48.9 % — ABNORMAL HIGH (ref 34.0–46.6)
Hemoglobin: 15.9 g/dL (ref 11.1–15.9)
MCH: 31.1 pg (ref 26.6–33.0)
MCHC: 32.5 g/dL (ref 31.5–35.7)
MCV: 96 fL (ref 79–97)
Platelets: 285 10*3/uL (ref 150–450)
RBC: 5.11 x10E6/uL (ref 3.77–5.28)
RDW: 12 % (ref 11.7–15.4)
WBC: 8.8 10*3/uL (ref 3.4–10.8)

## 2023-02-11 LAB — VITAMIN B12: Vitamin B-12: 448 pg/mL (ref 232–1245)

## 2023-02-11 NOTE — Progress Notes (Signed)
HI Kaitlyn Kirby,  Your vitamin D is still low.  How much are you taking? Your Triglycerides are still high.  LDL looks good. Blood count is nromal.  B12 looks great.

## 2023-04-07 LAB — HM DIABETES EYE EXAM

## 2023-05-18 ENCOUNTER — Ambulatory Visit (INDEPENDENT_AMBULATORY_CARE_PROVIDER_SITE_OTHER): Payer: 59 | Admitting: Family Medicine

## 2023-05-18 ENCOUNTER — Encounter: Payer: Self-pay | Admitting: Family Medicine

## 2023-05-18 VITALS — BP 114/77 | HR 86 | Ht 66.0 in | Wt 272.0 lb

## 2023-05-18 DIAGNOSIS — Z7689 Persons encountering health services in other specified circumstances: Secondary | ICD-10-CM

## 2023-05-18 DIAGNOSIS — M25551 Pain in right hip: Secondary | ICD-10-CM | POA: Diagnosis not present

## 2023-05-18 DIAGNOSIS — E119 Type 2 diabetes mellitus without complications: Secondary | ICD-10-CM

## 2023-05-18 DIAGNOSIS — N951 Menopausal and female climacteric states: Secondary | ICD-10-CM

## 2023-05-18 LAB — POCT GLYCOSYLATED HEMOGLOBIN (HGB A1C): Hemoglobin A1C: 8 % — AB (ref 4.0–5.6)

## 2023-05-18 LAB — POCT UA - MICROALBUMIN
Albumin/Creatinine Ratio, Urine, POC: <30
Creatinine, POC: 300 mg/dL
Microalbumin Ur, POC: 30 mg/L

## 2023-05-18 MED ORDER — TIRZEPATIDE 12.5 MG/0.5ML ~~LOC~~ SOAJ: 12.5000 mg | SUBCUTANEOUS | 0 refills | Status: DC

## 2023-05-18 MED ORDER — METFORMIN HCL ER 500 MG PO TB24: 500.0000 mg | ORAL_TABLET | Freq: Every day | ORAL | 1 refills | Status: DC

## 2023-05-18 NOTE — Assessment & Plan Note (Signed)
Visit #:4 Starting FIEPPI:951 lbs    Current weight:  Previous weight: 268 lbs  Change in weight:  Goal weight: TBD Dietary goals: Continue to portion control she is eating 6 small meals a day, work on increasing vegetable intake Exercise goals: Continue with daily walking and hopefully will be able to add in water aerobics twice a week Medication: Continue Semaglutide 2 mg Follow-up and referrals: 3 mo

## 2023-05-18 NOTE — Assessment & Plan Note (Signed)
1C went up to 8.0.  We discussed options she would really like to try switching to [facility].  We can do that but we discussed she is gena have to still continue to work on this lifestyle changes or she is gena eventually plateaued with that medication as well.  We discussed setting some really small goals for herself in regards to nutrition and activity level she is actually gena be retiring in about 3 weeks so she is hopeful she can count to get back on track.  I am going to also add a low-dose of metformin to her regimen as we need to absolutely get her A1c under better control we can always scale back on that later especially if she does really well with the [facility].  Follow-up in 6 weeks.

## 2023-05-18 NOTE — Progress Notes (Signed)
Established Patient Office Visit  Subjective  Patient ID: Kaitlyn Kirby, female    DOB: 1970-05-01  Age: 53 y.o. MRN: [id number]  Chief Complaint  Patient presents with   Diabetes    HPI  Kaitlyn Kirby has been really really struggling with her diet lately Kaitlyn Kirby does not feel like Kaitlyn Kirby has any increased appetite suppression on the Ozempic.  Kaitlyn Kirby has been eating a lot more carbs and sweets.  Kaitlyn Kirby has not been checking her blood sugars.  Kaitlyn Kirby has been struggling with some left hip pain on and off for a couple years Kaitlyn Kirby thinks it is probably related to when Kaitlyn Kirby is to play softball when Kaitlyn Kirby was younger sometimes it is more on the outer part of the hip and it does feel sore and tender Kaitlyn Kirby is usually able to use heat ice and ibuprofen and get relief sometimes it radiates anteriorly and actually feels like more recently the hip is going to give out while Kaitlyn Kirby is walking.  Kaitlyn Kirby has never had plain films done.    ROS    Objective:     BP 114/77   Pulse 86   Ht 5\' 6"  (1.676 m)   Wt 272 lb (123.4 kg)   SpO2 98%   BMI 43.90 kg/m    Physical Exam Vitals and nursing note reviewed.  Constitutional:      Appearance: Normal appearance.  HENT:     Head: Normocephalic and atraumatic.  Eyes:     Conjunctiva/sclera: Conjunctivae normal.  Cardiovascular:     Rate and Rhythm: Normal rate and regular rhythm.  Pulmonary:     Effort: Pulmonary effort is normal.     Breath sounds: Normal breath sounds.  Skin:    General: Skin is warm and dry.  Neurological:     Mental Status: Kaitlyn Kirby is alert.  Psychiatric:        Mood and Affect: Mood normal.      Results for orders placed or performed in visit on 05/18/23  POCT HgB A1C  Result Value Ref Range   Hemoglobin A1C 8.0 (A) 4.0 - 5.6 %   HbA1c POC (<> result, manual entry)     HbA1c, POC (prediabetic range)     HbA1c, POC (controlled diabetic range)    POCT UA - Microalbumin  Result Value Ref Range   Microalbumin Ur, POC 30 mg/L   Creatinine, POC 300  mg/dL   Albumin/Creatinine Ratio, Urine, POC <30       The 10-year ASCVD risk score (Arnett DK, et al., 2019) is: 3.2%    Assessment & Plan:   Problem List Items Addressed This Visit       Endocrine   Controlled type 2 diabetes mellitus (HCC) - Primary   1C went up to 8.0.  We discussed options Kaitlyn Kirby would really like to try switching to [facility].  We can do that but we discussed Kaitlyn Kirby is gena have to still continue to work on this lifestyle changes or Kaitlyn Kirby is gena eventually plateaued with that medication as well.  We discussed setting some really small goals for herself in regards to nutrition and activity level Kaitlyn Kirby is actually gena be retiring in about 3 weeks so Kaitlyn Kirby is hopeful Kaitlyn Kirby can count to get back on track.  I am going to also add a low-dose of metformin to her regimen as we need to absolutely get her A1c under better control we can always scale back on that later especially if Kaitlyn Kirby does really well with  the [facility].  Follow-up in 6 weeks.       Relevant Medications   tirzepatide (MOUNJARO) 12.5 MG/0.5ML Pen   metFORMIN (GLUCOPHAGE-XR) 500 MG 24 hr tablet   Other Relevant Orders   POCT HgB A1C (Completed)   POCT UA - Microalbumin (Completed)     Other   Perimenopausal symptoms   Continue low-dose venlafaxine for hot flashes.      Encounter for weight management   Visit #:4 Starting ZOXWRU:045 lbs    Current weight:  Previous weight: 268 lbs  Change in weight:  Goal weight: TBD Dietary goals: Continue to portion control Kaitlyn Kirby is eating 6 small meals a day, work on increasing vegetable intake Exercise goals: Continue with daily walking and hopefully will be able to add in water aerobics twice a week Medication: Continue Semaglutide 2 mg Follow-up and referrals: 3 mo      Other Visit Diagnoses       Right hip pain       Relevant Orders   DG Hip Unilat W OR W/O Pelvis 2-3 Views Right       Right  hip pain-it sounds like possibly a combination of bursitis and hip  arthritis.  Will get plain films when Kaitlyn Kirby is off of work in a few weeks Kaitlyn Kirby will come back by to have that done.  Return in about 6 weeks (around 06/29/2023) for Diabetes follow-up.    Nani Gasser, MD

## 2023-05-18 NOTE — Assessment & Plan Note (Signed)
Continue low-dose venlafaxine for hot flashes.

## 2023-06-07 ENCOUNTER — Ambulatory Visit

## 2023-06-07 DIAGNOSIS — M25551 Pain in right hip: Secondary | ICD-10-CM

## 2023-06-20 ENCOUNTER — Other Ambulatory Visit: Payer: Self-pay | Admitting: Family Medicine

## 2023-06-20 DIAGNOSIS — E119 Type 2 diabetes mellitus without complications: Secondary | ICD-10-CM

## 2023-06-21 ENCOUNTER — Other Ambulatory Visit: Payer: Self-pay | Admitting: Family Medicine

## 2023-06-21 DIAGNOSIS — E119 Type 2 diabetes mellitus without complications: Secondary | ICD-10-CM

## 2023-06-22 ENCOUNTER — Other Ambulatory Visit: Payer: Self-pay | Admitting: Family Medicine

## 2023-06-22 DIAGNOSIS — E119 Type 2 diabetes mellitus without complications: Secondary | ICD-10-CM

## 2023-06-23 ENCOUNTER — Encounter: Payer: Self-pay | Admitting: Family Medicine

## 2023-06-23 NOTE — Progress Notes (Signed)
 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  Again it would be more in the mild category.  I would like to get you in with Ortho or sports med for next steps if you are still having a lot of discomfort and pain.

## 2023-06-24 MED ORDER — TIRZEPATIDE 12.5 MG/0.5ML ~~LOC~~ SOAJ: 12.5000 mg | SUBCUTANEOUS | 0 refills | Status: DC

## 2023-06-28 ENCOUNTER — Encounter: Admitting: Sports Medicine

## 2023-06-28 ENCOUNTER — Encounter: Payer: Self-pay | Admitting: Family Medicine

## 2023-06-28 ENCOUNTER — Telehealth: Payer: Self-pay | Admitting: Family Medicine

## 2023-06-28 ENCOUNTER — Other Ambulatory Visit: Payer: Self-pay | Admitting: *Deleted

## 2023-06-28 ENCOUNTER — Ambulatory Visit: Payer: 59 | Admitting: Family Medicine

## 2023-06-28 VITALS — BP 123/78 | HR 63 | Ht 66.0 in | Wt 274.0 lb

## 2023-06-28 DIAGNOSIS — E119 Type 2 diabetes mellitus without complications: Secondary | ICD-10-CM | POA: Diagnosis not present

## 2023-06-28 DIAGNOSIS — Z7985 Long-term (current) use of injectable non-insulin antidiabetic drugs: Secondary | ICD-10-CM | POA: Diagnosis not present

## 2023-06-28 DIAGNOSIS — F3341 Major depressive disorder, recurrent, in partial remission: Secondary | ICD-10-CM | POA: Diagnosis not present

## 2023-06-28 DIAGNOSIS — Z7689 Persons encountering health services in other specified circumstances: Secondary | ICD-10-CM | POA: Diagnosis not present

## 2023-06-28 LAB — POCT GLYCOSYLATED HEMOGLOBIN (HGB A1C): Hemoglobin A1C: 7.1 % — AB (ref 4.0–5.6)

## 2023-06-28 MED ORDER — TIRZEPATIDE 12.5 MG/0.5ML ~~LOC~~ SOAJ: 12.5000 mg | SUBCUTANEOUS | 1 refills | Status: DC

## 2023-06-28 MED ORDER — TIRZEPATIDE 12.5 MG/0.5ML ~~LOC~~ SOAJ: 12.5000 mg | SUBCUTANEOUS | 0 refills | Status: DC

## 2023-06-28 NOTE — Telephone Encounter (Signed)
 Patient informed.

## 2023-06-28 NOTE — Assessment & Plan Note (Signed)
 Visit #:5 Starting ZOXWRU:045 lbs    Current weight: 272 Previous weight: 268 lbs  Change in weight: Up 4 lbs  Goal weight: TBD Dietary goals: Continue to portion control she is eating 6 small meals a day, work on increasing vegetable intake Exercise goals: Continue with daily walking and hopefully will be able to add in water aerobics twice a week Medication: Mounjaro 12.5mg   Follow-up and referrals: 3 mo

## 2023-06-28 NOTE — Progress Notes (Unsigned)
 Established Patient Office Visit  Subjective  Patient ID: Kaitlyn Kirby, female    DOB: 05-12-1970  Age: 53 y.o. MRN: [id number]  Chief Complaint  Patient presents with   Diabetes    Pt would like to change pharmacies and get a 90 day supply of Moujaro 12.5    HPI  Here for DM f/u - doping well on the monjaro though has been out for about a week, Pharmacy can't get in. Would ike to change pharmacies.   Enjoying retirement!!!    ROS    Objective:     BP 123/78   Pulse 63   Ht 5\' 6"  (1.676 m)   Wt 274 lb (124.3 kg)   SpO2 98%   BMI 44.22 kg/m    Physical Exam Vitals and nursing note reviewed.  Constitutional:      Appearance: Normal appearance.  HENT:     Head: Normocephalic and atraumatic.  Eyes:     Conjunctiva/sclera: Conjunctivae normal.  Cardiovascular:     Rate and Rhythm: Normal rate and regular rhythm.  Pulmonary:     Effort: Pulmonary effort is normal.     Breath sounds: Normal breath sounds.  Skin:    General: Skin is warm and dry.  Neurological:     Mental Status: She is alert.  Psychiatric:        Mood and Affect: Mood normal.      Results for orders placed or performed in visit on 06/28/23  POCT HgB A1C  Result Value Ref Range   Hemoglobin A1C 7.1 (A) 4.0 - 5.6 %   HbA1c POC (<> result, manual entry)     HbA1c, POC (prediabetic range)     HbA1c, POC (controlled diabetic range)        The 10-year ASCVD risk score (Arnett DK, et al., 2019) is: 3.7%    Assessment & Plan:   Problem List Items Addressed This Visit       Endocrine   Controlled type 2 diabetes mellitus (HCC) - Primary   A1c looks great today at 7.2 which is down from 8 which is fantastic tolerating the Mounjaro 12.5 mg well.  Now that she is retired her mood has been much better and she feels like she is able to focus more on herself and self-care including what she is eating and staying active.  So we discussed continuing current regimen for now and then following  up in 3 to 4 months.      Relevant Orders   POCT HgB A1C (Completed)     Other   Encounter for weight management   Visit #:5 Starting IONGEX:528 lbs    Current weight: 272 Previous weight: 268 lbs  Change in weight: Up 4 lbs  Goal weight: TBD Dietary goals: Continue to portion control she is eating 6 small meals a day, work on increasing vegetable intake Exercise goals: Continue with daily walking and hopefully will be able to add in water aerobics twice a week Medication: Mounjaro 12.5mg   Follow-up and referrals: 3 mo      Depression   Go ahead and taper off the venlafaxine if she has any problems please give [place] a call back if she would like to extend the taper a little longer then please let me know but she again feels like emotionally she is in a great place and would like to come off the medication.  Decrease venlafaxine to 1 tab every other day for 2 weeks and then okay to  stop.      Request for eye exam from [place], [place]   Return in about 3 months (around 09/27/2023) for Diabetes follow-up, Hypertension.    Nani Gasser, MD

## 2023-06-28 NOTE — Assessment & Plan Note (Addendum)
 Go ahead and taper off the venlafaxine if she has any problems please give [place] a call back if she would like to extend the taper a little longer then please let me know but she again feels like emotionally she is in a great place and would like to come off the medication.  Decrease venlafaxine to 1 tab every other day for 2 weeks and then okay to stop.

## 2023-06-28 NOTE — Telephone Encounter (Signed)
 Call pt:  Decrease venlafaxine to 1 tab every other day for 2 weeks and then okay to stop.

## 2023-06-28 NOTE — Assessment & Plan Note (Signed)
 A1c looks great today at 7.2 which is down from 8 which is fantastic tolerating the Mounjaro 12.5 mg well.  Now that she is retired her mood has been much better and she feels like she is able to focus more on herself and self-care including what she is eating and staying active.  So we discussed continuing current regimen for now and then following up in 3 to 4 months.

## 2023-06-29 ENCOUNTER — Other Ambulatory Visit: Payer: Self-pay | Admitting: Family Medicine

## 2023-06-29 DIAGNOSIS — E119 Type 2 diabetes mellitus without complications: Secondary | ICD-10-CM

## 2023-06-29 MED ORDER — TIRZEPATIDE 12.5 MG/0.5ML ~~LOC~~ SOAJ: 12.5000 mg | SUBCUTANEOUS | 1 refills | Status: DC

## 2023-06-29 NOTE — Addendum Note (Signed)
 Addended by: Nani Gasser D on: 06/29/2023 06:33 PM   Modules accepted: Orders

## 2023-07-01 ENCOUNTER — Other Ambulatory Visit: Payer: Self-pay | Admitting: Family Medicine

## 2023-07-01 DIAGNOSIS — E119 Type 2 diabetes mellitus without complications: Secondary | ICD-10-CM

## 2023-07-02 ENCOUNTER — Other Ambulatory Visit: Payer: Self-pay

## 2023-07-02 DIAGNOSIS — E119 Type 2 diabetes mellitus without complications: Secondary | ICD-10-CM

## 2023-07-05 NOTE — Telephone Encounter (Signed)
 Epic is not letting me pull up the prescription to move it to [facility] [address].

## 2023-07-06 ENCOUNTER — Other Ambulatory Visit (HOSPITAL_COMMUNITY): Payer: Self-pay

## 2023-07-06 ENCOUNTER — Telehealth: Payer: Self-pay

## 2023-07-06 MED ORDER — TIRZEPATIDE 12.5 MG/0.5ML ~~LOC~~ SOAJ: 12.5000 mg | SUBCUTANEOUS | 1 refills | Status: DC

## 2023-07-06 NOTE — Telephone Encounter (Signed)
 Pharmacy Patient Advocate Encounter   Received notification from Pt Calls Messages that prior authorization for Mounjaro 12.5 is required/requested.   Insurance verification completed.   The patient is insured through [facility] .   Per test claim: PA required; PA submitted to above mentioned insurance via CoverMyMeds Key/confirmation #/EOC [id number] Status is pending

## 2023-07-06 NOTE — Telephone Encounter (Signed)
 Pharmacy Patient Advocate Encounter  Received notification from [facility] that Prior Authorization for [facility] 12.5 has been APPROVED from 07/06/23 to 07/06/26. Unable to obtain price due to refill too soon rejection, last fill date 06/28/23 next available fill date4/25/25   PA #/Case ID/Reference #: [employer]

## 2023-07-06 NOTE — Telephone Encounter (Signed)
 Done  Meds ordered this encounter  Medications   tirzepatide (MOUNJARO) 12.5 MG/0.5ML Pen    Sig: Inject 12.5 mg into the skin once a week.    Dispense:  6 mL    Refill:  1

## 2023-07-07 ENCOUNTER — Ambulatory Visit: Admitting: Sports Medicine

## 2023-07-07 ENCOUNTER — Ambulatory Visit

## 2023-07-07 ENCOUNTER — Encounter: Payer: Self-pay | Admitting: Sports Medicine

## 2023-07-07 ENCOUNTER — Other Ambulatory Visit: Payer: Self-pay | Admitting: Family Medicine

## 2023-07-07 DIAGNOSIS — Z1231 Encounter for screening mammogram for malignant neoplasm of breast: Secondary | ICD-10-CM

## 2023-07-07 DIAGNOSIS — M79645 Pain in left finger(s): Secondary | ICD-10-CM

## 2023-07-07 DIAGNOSIS — M1611 Unilateral primary osteoarthritis, right hip: Secondary | ICD-10-CM | POA: Insufficient documentation

## 2023-07-07 DIAGNOSIS — G8929 Other chronic pain: Secondary | ICD-10-CM | POA: Diagnosis not present

## 2023-07-07 MED ORDER — MELOXICAM 15 MG PO TABS: ORAL_TABLET | ORAL | 3 refills | Status: DC

## 2023-07-07 NOTE — Assessment & Plan Note (Signed)
 Pain right groin for many years, on exam she does have pain with internal rotation without loss of motion, positive FADIR sign, x-rays did show acetabular spurring, suspect some osteoarthritis, though with her other exam findings I am concerned for a labral injury. We will start conservatively with formal therapy, home therapy, meloxicam. Return to see me in 6 weeks, MR arthrography if not better.

## 2023-07-07 NOTE — Progress Notes (Signed)
    Procedures performed today:    None.  Independent interpretation of notes and tests performed by another provider:   Arthritic changes noted on x-rays.  Brief History, Exam, Impression, and Recommendations:    Primary osteoarthritis of right hip Pain right groin for many years, on exam she does have pain with internal rotation without loss of motion, positive FADIR sign, x-rays did show acetabular spurring, suspect some osteoarthritis, though with her other exam findings I am concerned for a labral injury. We will start conservatively with formal therapy, home therapy, meloxicam. Return to see me in 6 weeks, MR arthrography if not better.  Chronic pain of left thumb Intermittent sharp numbing pain left thumb ulnar aspect of the pad of the distal phalanx. Exam is normal. 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    ____________________________________________ Ihor Austin. Benjamin Stain, M.D., ABFM., CAQSM., AME. Primary Care and Sports Medicine [facility]  Adjunct Professor of Family Medicine  [employer]  Restaurant manager, fast food

## 2023-07-07 NOTE — Assessment & Plan Note (Signed)
 Intermittent sharp numbing pain left thumb ulnar aspect of the pad of the distal phalanx. Exam is normal. 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

## 2023-07-11 NOTE — Therapy (Unsigned)
 OUTPATIENT PHYSICAL THERAPY LOWER EXTREMITY EVALUATION   Patient Name: Kaitlyn Kirby MRN: [id number] DOB:[birth year], 53 y.o., female Today's Date: 07/12/2023  END OF SESSION:  PT End of Session - 07/12/23 1021     Visit Number 1    Number of Visits 24    Date for PT Re-Evaluation 10/04/23    Authorization Type state health plan [employer] $1500 deduct coins 70/30; copay - none - no auth    PT Start Time [phone]   pt late for appt   PT Stop Time 0930    PT Time Calculation (min) 40 min    Activity Tolerance Patient tolerated treatment well             Past Medical History:  Diagnosis Date   Abnormal Pap smear 1995   [place]   Allergy    Asthma    seasonal   Basal cell carcinoma 2010   Depression    DM type 2 (diabetes mellitus, type 2) (HCC)    Hemorrhoids    IBS (irritable bowel syndrome) 8-10 years ago   PONV (postoperative nausea and vomiting)    Rectal fissure    Past Surgical History:  Procedure Laterality Date   EYE SURGERY Right 2017   clogged tear duct   HEMORRHOID SURGERY  06/22/2019   MINOR SPHINCTEROTOMY N/A 02/07/2020   Procedure: ANAL SPHINCTEROTOMY.HEMORRHOIDECTOMY.ANORECTAL EXAMINATION UNDER ANESTHESIA, EXCISION OF ANAL POLYPS;  Surgeon: Candyce Champagne, MD;  Location: [facility];  Service: General;  Laterality: N/A;   SKIN CANCER EXCISION  2010   chest, basal cell   WISDOM TOOTH EXTRACTION  53 years old   Patient Active Problem List   Diagnosis Date Noted   Primary osteoarthritis of right hip 07/07/2023   Chronic pain of left thumb 07/07/2023   Perimenopausal symptoms 11/04/2022   Stress incontinence 03/10/2022   Jaundice 01/06/2022   Dyspnea on exertion 01/06/2022   B12 deficiency 10/15/2021   Vitamin D deficiency 10/15/2021   Encounter for weight management 04/15/2021   OSA (obstructive sleep apnea) 01/23/2021   Hyperlipidemia 08/02/2019   Controlled type 2 diabetes mellitus (HCC) 11/16/2018   Secondary oligomenorrhea 11/11/2018    Dyspareunia in female 02/04/2018   Internal hemorrhoid, bleeding 12/23/2017   Severe obesity (BMI >= 40) (HCC) 09/14/2013   Lichen simplex chronicus 02/03/2012   Fissure in ano 01/02/2011   Allergic rhinitis due to allergen 12/28/2008   LUMBAGO 04/05/2006   Depression 02/04/2006   Hemorrhoids, external 02/04/2006   Irritable bowel syndrome with constipation 02/04/2006    PCP: Dr Duaine German  REFERRING PROVIDER: Dr Annemarie Kil  REFERRING DIAG: OA R hip   THERAPY DIAG:  Pain in right hip - Plan: PT plan of care cert/re-cert  Other symptoms and signs involving the musculoskeletal system - Plan: PT plan of care cert/re-cert  Muscle weakness (generalized) - Plan: PT plan of care cert/re-cert  Rationale for Evaluation and Treatment: Rehabilitation  ONSET DATE: 05/01/23  SUBJECTIVE:   SUBJECTIVE STATEMENT: Patient reports that she has had R hip pain for the past 10 years on an intermittent basis pain is constant in the past two months. She played sports and feels that she overused the R hip. She played softball - third base - and feels the throwing and batting, running during the time she played sports. Pain is constant with variable intensity  PERTINENT HISTORY: AODM; denies any other medical or musculoskeletal problems   PAIN:  Are you having pain?  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  DIAGNOSTIC FINDINGS: Xray R hip 06/07/23: The hip joint spaces preserved. There is mild acetabular spurring. No evidence of fracture, erosion, or avascular necrosis. Slight degenerative change of the sacroiliac joints. Pubic symphysis is congruent. No evidence of focal bone lesion or bone destruction. Multiple phleboliths in the pelvis.   IMPRESSION: Mild degenerative acetabular spurring of the right hip.  PATIENT SURVEYS:  LEFS 35/80; 43.8%  COGNITION: Overall cognitive status: Within functional limits for tasks assessed     SENSATION: WFL  EDEMA:  Some edema in R anterior hip on an intermittent basis   MUSCLE LENGTH: Hamstrings: Right 60 deg; Left 65 deg Thomas test: Right -10-15 deg; Left -5-10 deg  POSTURE: knees hyperextended; increased lumbar lordosis   PALPATION: Muscular tightness to palpation through the R anterior hip; hip flexors; groin; posterior hip - piriformis, glut med, ITB to lesser extent glut max  LOWER EXTREMITY ROM:  Active ROM Right eval Left eval  Hip flexion Tight 90 deg Tight 100 deg   Hip extension (-)10-15 (-)5-10  Hip abduction Tight  Tight   Hip adduction    Hip internal rotation ~ 25 deg sitting  ~ 35 deg sitting   Hip external rotation ~20 deg sitting  ~ 20 deg sitting   Knee flexion WFLs WFLs  Knee extension Hyperextension  Hyperextension   Ankle dorsiflexion    Ankle plantarflexion    Ankle inversion    Ankle eversion     (Blank rows = not tested)  LOWER EXTREMITY MMT:  MMT Right eval Left eval  Hip flexion 4- 5-  Hip  extension 4 4  Hip abduction 4- 4  Hip adduction    Hip internal rotation    Hip external rotation    Knee flexion 5 5  Knee extension 5 5  Ankle dorsiflexion    Ankle plantarflexion    Ankle inversion    Ankle eversion     (Blank rows = not tested)  LOWER EXTREMITY SPECIAL TESTS:  Hip special tests: FADIR test positive; Portia Brittle (FABER) test: negative, Trendelenburg test: negative, Thomas test: positive for hip flexor tightness , SI compression test: negative, SI distraction test: negative, and Piriformis test: positive for muscular tightness   FUNCTIONAL TESTS:  5 times sit to stand: 12.34 sec weight shifted to L    GAIT: Distance walked: 40 ft Assistive device utilized: None Level of assistance: Complete Independence Comments: LE's in ER no asymmetry noted  TREATMENT DATE:  See HEP for exercises Education and discussion of eval findings and POC     PATIENT EDUCATION:  Education details: POC; HEP  Person educated: Patient Education method: Explanation, Demonstration, Tactile cues, Verbal cues, and Handouts Education comprehension: verbalized understanding, returned demonstration, verbal cues required, tactile cues required, and needs further education  HOME EXERCISE PROGRAM: Access Code: [email or web address] URL: [email or web address] Date: 07/12/2023 Prepared by: [name] Leach  Exercises - Hip Flexor Stretch at Edge of Bed  - 2 x daily - 7 x weekly - 1 sets - 3 reps - 30 sec  hold - Seated Hip Flexor Stretch  - 2 x daily - 7 x weekly - 1 sets - 3 reps - 30 sec  hold - Supine Piriformis Stretch with Leg Straight  - 2 x daily - 7 x weekly - 1 sets - 3 reps - 30 sec  hold - Supine ITB Stretch with Strap  - 2 x daily - 7 x weekly - 1 sets - 3 reps - 30 sec  hold  ASSESSMENT:  CLINICAL IMPRESSION: Patient is a 53 y.o. female who was seen  today for physical therapy evaluation and treatment for R hip OA. She reports intermittent symptoms for at least the past 10 years but increased pain which is now constant in the past 2 months. She has limited ROM and mobility; decreased strength; pain with palpation R hip in hip flexors, abductors, extensors. She has pain with prolonged sitting, standing, walking. Diagnostic tests show mild degenerative acetabular spurring of the right hip.  Patient will benefit from PT to address problems identified.   OBJECTIVE IMPAIRMENTS: decreased activity tolerance, decreased balance, decreased endurance, decreased mobility, difficulty walking, decreased ROM, decreased strength, increased fascial restrictions, increased muscle spasms, impaired flexibility, postural dysfunction, obesity, and pain.   ACTIVITY LIMITATIONS: lifting, bending, sitting, standing, squatting, and stairs  PARTICIPATION LIMITATIONS: meal prep, cleaning, laundry, shopping, and community activity  PERSONAL FACTORS: Behavior pattern, Fitness, Past/current experiences, Time since onset of injury/illness/exacerbation, and comorbidities: chronic nature of symptoms; sedentary lifestyle; sitting job for the last 8-10 months are also affecting patient's functional outcome.   REHAB POTENTIAL: Good  CLINICAL DECISION MAKING: Evolving/moderate complexity  EVALUATION COMPLEXITY: Moderate   GOALS: Goals reviewed with patient? Yes  SHORT TERM GOALS: Target date: 08/23/2023   Independent in initial HEP  Baseline: Goal status: INITIAL  2.  Increase mobility R hip with hip extension to neutral with posterior pelvic tilt in supine  Baseline:  Goal status: INITIAL  3.  Increase R hip strength by 1/2 muscle grade in hip flexion abduction, extension Baseline:  Goal status: INITIAL   LONG TERM GOALS: Target date: 10/04/2023    Decrease R hip pain to 0-2/10 for functional activities including sitting or standing > one hour Baseline:  Goal  status: INITIAL  2.  Increase strength R hip to 4+/5 to 5/5  Baseline:  Goal status: INITIAL  3.  Increase sitting time to 60-80 min with minimal to no increase in hip pain Baseline:  Goal status: INITIAL  4.  Increase standing and walking tolerance to 60-80 minutes  Baseline:  Goal status: INITIAL  5.  Independent in HEP including aquatic program as indicated  Baseline:  Goal status: INITIAL  6.  Improve LEFS by 20 points  Baseline: 35/80; 43.8%  Goal status: INITIAL   PLAN:  PT FREQUENCY: 2x/week  PT DURATION: 8 weeks  PLANNED INTERVENTIONS: 97110-Therapeutic exercises, 97530- Therapeutic activity, [id number]- Neuromuscular re-education, 97535- Self Care, 54098- Manual therapy,  40981- Gait training, 19147- Aquatic Therapy, [phone]- Ionotophoresis 4mg /ml Dexamethasone, Patient/Family education, Balance training, Stair training, Taping, Dry Needling, and Joint mobilization  PLAN FOR NEXT SESSION: review and progress exercises; education; manual work and modalities as indicated    Monifah Freehling P Janeka Libman, PT 07/12/2023, 11:03 AM

## 2023-07-12 ENCOUNTER — Ambulatory Visit: Attending: Sports Medicine | Admitting: Rehabilitative and Restorative Service Providers"

## 2023-07-12 ENCOUNTER — Encounter: Payer: Self-pay | Admitting: Rehabilitative and Restorative Service Providers"

## 2023-07-12 ENCOUNTER — Other Ambulatory Visit: Payer: Self-pay

## 2023-07-12 DIAGNOSIS — M6281 Muscle weakness (generalized): Secondary | ICD-10-CM | POA: Diagnosis present

## 2023-07-12 DIAGNOSIS — R29898 Other symptoms and signs involving the musculoskeletal system: Secondary | ICD-10-CM | POA: Diagnosis present

## 2023-07-12 DIAGNOSIS — M25551 Pain in right hip: Secondary | ICD-10-CM | POA: Diagnosis present

## 2023-07-12 DIAGNOSIS — M1611 Unilateral primary osteoarthritis, right hip: Secondary | ICD-10-CM | POA: Insufficient documentation

## 2023-07-14 ENCOUNTER — Ambulatory Visit: Admitting: Rehabilitative and Restorative Service Providers"

## 2023-07-14 ENCOUNTER — Encounter: Payer: Self-pay | Admitting: Rehabilitative and Restorative Service Providers"

## 2023-07-14 DIAGNOSIS — R29898 Other symptoms and signs involving the musculoskeletal system: Secondary | ICD-10-CM

## 2023-07-14 DIAGNOSIS — M25551 Pain in right hip: Secondary | ICD-10-CM

## 2023-07-14 DIAGNOSIS — M6281 Muscle weakness (generalized): Secondary | ICD-10-CM

## 2023-07-14 NOTE — Therapy (Signed)
 OUTPATIENT PHYSICAL THERAPY LOWER EXTREMITY TREATMENT   Patient Name: Kaitlyn Kirby MRN: [id number] DOB:[birth year], 53 y.o., female Today's Date: 07/14/2023  END OF SESSION:  PT End of Session - 07/14/23 0805     Visit Number 2    Number of Visits 24    Date for PT Re-Evaluation 10/04/23    Authorization Type state health plan [employer] $1500 deduct coins 70/30; copay - none - no auth    PT Start Time 0804    PT Stop Time 0845    PT Time Calculation (min) 41 min    Activity Tolerance Patient tolerated treatment well             Past Medical History:  Diagnosis Date   Abnormal Pap smear 1995   [place]   Allergy    Asthma    seasonal   Basal cell carcinoma 2010   Depression    DM type 2 (diabetes mellitus, type 2) (HCC)    Hemorrhoids    IBS (irritable bowel syndrome) 8-10 years ago   PONV (postoperative nausea and vomiting)    Rectal fissure    Past Surgical History:  Procedure Laterality Date   EYE SURGERY Right 2017   clogged tear duct   HEMORRHOID SURGERY  06/22/2019   MINOR SPHINCTEROTOMY N/A 02/07/2020   Procedure: ANAL SPHINCTEROTOMY.HEMORRHOIDECTOMY.ANORECTAL EXAMINATION UNDER ANESTHESIA, EXCISION OF ANAL POLYPS;  Surgeon: Karie Soda, MD;  Location: [facility] [place];  Service: General;  Laterality: N/A;   SKIN CANCER EXCISION  2010   chest, basal cell   WISDOM TOOTH EXTRACTION  53 years old   Patient Active Problem List   Diagnosis Date Noted   Primary osteoarthritis of right hip 07/07/2023   Chronic pain of left thumb 07/07/2023   Perimenopausal symptoms 11/04/2022   Stress incontinence 03/10/2022   Jaundice 01/06/2022   Dyspnea on exertion 01/06/2022   B12 deficiency 10/15/2021   Vitamin D deficiency 10/15/2021   Encounter for weight management 04/15/2021   OSA (obstructive sleep apnea) 01/23/2021   Hyperlipidemia 08/02/2019   Controlled type 2 diabetes mellitus (HCC) 11/16/2018   Secondary oligomenorrhea 11/11/2018   Dyspareunia in  female 02/04/2018   Internal hemorrhoid, bleeding 12/23/2017   Severe obesity (BMI >= 40) (HCC) 09/14/2013   Lichen simplex chronicus 02/03/2012   Fissure in ano 01/02/2011   Allergic rhinitis due to allergen 12/28/2008   LUMBAGO 04/05/2006   Depression 02/04/2006   Hemorrhoids, external 02/04/2006   Irritable bowel syndrome with constipation 02/04/2006    PCP: Dr Nani Gasser  REFERRING PROVIDER: Dr Rodney Langton  REFERRING DIAG: OA R hip   THERAPY DIAG:  Pain in right hip  Other symptoms and signs involving the musculoskeletal system  Muscle weakness (generalized)  Rationale for Evaluation and Treatment: Rehabilitation  ONSET DATE: 05/01/23  SUBJECTIVE:   SUBJECTIVE STATEMENT: Working on exercises at home. They are difficult due to weight. She has not found a strap to help with stretches yet. Feels sore this morning.    EVAL: Patient reports that she has had R hip pain for the past 10 years on an intermittent basis pain is constant in the past two months. She played sports and feels that she overused the R hip. She played softball - third base - and feels the throwing and batting, running during the time she played sports. Pain is constant with variable intensity  PERTINENT HISTORY: AODM; denies any other medical or musculoskeletal problems   PAIN:  Are you having pain?  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  DIAGNOSTIC FINDINGS: Xray R hip 06/07/23: The hip joint spaces preserved. There is mild acetabular spurring. No evidence of fracture, erosion, or avascular necrosis. Slight degenerative change of the sacroiliac joints. Pubic symphysis is congruent. No evidence of focal bone lesion or bone destruction. Multiple phleboliths in the pelvis.   IMPRESSION: Mild degenerative acetabular spurring of the right hip.  PATIENT SURVEYS:  LEFS 35/80; 43.8%    SENSATION: WFL  EDEMA:  Some edema in R anterior hip on an intermittent basis   MUSCLE LENGTH: Hamstrings: Right 60 deg; Left 65 deg Thomas test: Right -10-15 deg; Left -5-10 deg  POSTURE: knees hyperextended; increased lumbar lordosis   PALPATION: Muscular tightness to palpation through the R anterior hip; hip flexors; groin; posterior hip - piriformis, glut med, ITB to lesser extent glut max  LOWER EXTREMITY ROM:  Active ROM Right eval Left eval  Hip flexion Tight 90 deg Tight 100 deg   Hip extension (-)10-15 (-)5-10  Hip abduction Tight  Tight   Hip adduction    Hip internal rotation ~ 25 deg sitting  ~ 35 deg sitting   Hip external rotation ~20 deg sitting  ~ 20 deg sitting   Knee flexion WFLs WFLs  Knee extension Hyperextension  Hyperextension   Ankle dorsiflexion    Ankle plantarflexion    Ankle inversion    Ankle eversion     (Blank rows = not tested)  LOWER EXTREMITY MMT:  MMT Right eval Left eval  Hip flexion 4- 5-  Hip extension 4 4  Hip abduction 4- 4  Hip adduction    Hip internal rotation    Hip external rotation    Knee  flexion 5 5  Knee extension 5 5  Ankle dorsiflexion    Ankle plantarflexion    Ankle inversion    Ankle eversion     (Blank rows = not tested)  LOWER EXTREMITY SPECIAL TESTS:  Hip special tests: FADIR test positive; Portia Brittle (FABER) test: negative, Trendelenburg test: negative, Thomas test: positive for hip flexor tightness , SI compression test: negative, SI distraction test: negative, and Piriformis test: positive for muscular tightness   FUNCTIONAL TESTS:  5 times sit to stand: 12.34 sec weight shifted to L    GAIT: Distance walked: 40 ft Assistive device utilized: None Level of assistance: Complete Independence Comments: LE's in ER no asymmetry noted   07/14/23: assessment of gait pattern on treadmill shows positive Trendelenburg on R (with wt bearing on R she has drop of L hemipelvis)  [facility] Adult PT Treatment:                                                DATE: 07/14/23 Therapeutic Exercise: Treadmill 1.7 mph x 5 min for warm up and evaluation of gait  Supine  Piriformis stretch w/ strap 30 sec x 3 Hamstring stretch with strap 30 sec x 3 ITB/lateral HS stretch with strap 30 sec x 3 Sitting Piriformis stretch 30 sec x 3  Sidelying  Standing  Therapeutic Activity: Sidelying Hip abduction leading with heel 5 sec x 10 x 2  Standing  Hip abduction blue TB 3 sec x 10  Trial of standing isometric hip abduction at wall increased R knee pain Supine  Hip abduction alternating LE's blue TB x 10 x 2   Self Care: Myofacial ball release work standing                                                                                                                                TREATMENT DATE:  See HEP for exercises Education and discussion of eval findings and POC     PATIENT EDUCATION:  Education details: POC; HEP  Person educated: Patient Education method: Programmer, multimedia, Demonstration, Actor cues, Verbal cues, and Handouts Education comprehension: verbalized  understanding, returned demonstration, verbal cues required, tactile cues required, and needs further education  HOME EXERCISE PROGRAM: Access Code: [email or web address] URL: [email or web address] Date: 07/14/2023 Prepared by: Estalene Bergey  Exercises - Hip Flexor Stretch at Edge of Bed  - 2 x daily - 7 x weekly - 1 sets - 3 reps - 30 sec  hold - Seated Hip Flexor Stretch  - 2 x daily - 7 x weekly - 1 sets - 3 reps - 30 sec  hold - Supine Piriformis Stretch with Leg Straight  - 2 x daily - 7 x weekly - 1 sets - 3 reps - 30 sec  hold - Supine ITB Stretch with Strap  - 2 x daily - 7 x weekly - 1 sets - 3 reps - 30 sec  hold - Sidelying Hip Abduction  - 1 x daily - 7 x weekly - 3 sets - 10 reps - 3-5 sec  hold - Hooklying Isometric Clamshell  - 1 x daily - 7 x weekly - 1 sets - 10 reps - 3 sec  hold - Standing Hip Abduction with Resistance at Ankles and Counter Support  - 1 x daily - 7 x weekly - 1-2 sets - 10 reps - 3-5 sec  hold - Standing Piriformis Release with Ball at [employer]  - 2 x daily - 7 x weekly - 30-60 sec  hold  ASSESSMENT:  CLINICAL IMPRESSION: Further assessment of gait on treadmill shows positive Trendelenburg on R (with wt bearing on R she has drop of  L hemipelvis) indicating weakness in R glut med.  Treatment consisted of review of stretching and progression of exercises with focus on strengthening of R hip abductors. Patient had some difficulty with technique for isolating glut med. Will work on [employer] while on vacation for the next three weeks.   EVAL: Patient is a 53 y.o. female who was seen today for physical therapy evaluation and treatment for R hip OA. She reports intermittent symptoms for at least the past 10 years but increased pain which is now constant in the past 2 months. She has limited ROM and mobility; decreased strength; pain with palpation R hip in hip flexors, abductors, extensors. She has pain with prolonged sitting, standing, walking. Diagnostic tests show mild  degenerative acetabular spurring of the right hip.  Patient will benefit from PT to address problems identified.   OBJECTIVE IMPAIRMENTS: decreased activity tolerance, decreased balance, decreased endurance, decreased mobility, difficulty walking, decreased ROM, decreased strength, increased fascial restrictions, increased muscle spasms, impaired flexibility, postural dysfunction, obesity, and pain.    GOALS: Goals reviewed with patient? Yes  SHORT TERM GOALS: Target date: 08/23/2023   Independent in initial HEP  Baseline: Goal status: INITIAL  2.  Increase mobility R hip with hip extension to neutral with posterior pelvic tilt in supine  Baseline:  Goal status: INITIAL  3.  Increase R hip strength by 1/2 muscle grade in hip flexion abduction, extension Baseline:  Goal status: INITIAL   LONG TERM GOALS: Target date: 10/04/2023    Decrease R hip pain to 0-2/10 for functional activities including sitting or standing > one hour Baseline:  Goal status: INITIAL  2.  Increase strength R hip to 4+/5 to 5/5  Baseline:  Goal status: INITIAL  3.  Increase sitting time to 60-80 min with minimal to no increase in hip pain Baseline:  Goal status: INITIAL  4.  Increase standing and walking tolerance to 60-80 minutes  Baseline:  Goal status: INITIAL  5.  Independent in HEP including aquatic program as indicated  Baseline:  Goal status: INITIAL  6.  Improve LEFS by 20 points  Baseline: 35/80; 43.8%  Goal status: INITIAL   PLAN:  PT FREQUENCY: 2x/week  PT DURATION: 8 weeks  PLANNED INTERVENTIONS: 97110-Therapeutic exercises, 97530- Therapeutic activity, 97112- Neuromuscular re-education, 97535- Self Care, 69629- Manual therapy, [phone]- Gait training, [phone]- Aquatic Therapy, [phone]- Ionotophoresis 4mg /ml Dexamethasone, Patient/Family education, Balance training, Stair training, Taping, Dry Needling, and Joint mobilization  PLAN FOR NEXT SESSION: review and progress exercises;  education; manual work and modalities as indicated -strengthening glut med    [employer], PT 07/14/2023, 8:05 AM

## 2023-08-04 ENCOUNTER — Encounter (HOSPITAL_COMMUNITY): Payer: Self-pay

## 2023-08-11 ENCOUNTER — Ambulatory Visit

## 2023-08-11 ENCOUNTER — Encounter: Payer: Self-pay | Admitting: Rehabilitative and Restorative Service Providers"

## 2023-08-11 ENCOUNTER — Ambulatory Visit: Attending: Sports Medicine | Admitting: Rehabilitative and Restorative Service Providers"

## 2023-08-11 DIAGNOSIS — M6281 Muscle weakness (generalized): Secondary | ICD-10-CM | POA: Diagnosis present

## 2023-08-11 DIAGNOSIS — Z1231 Encounter for screening mammogram for malignant neoplasm of breast: Secondary | ICD-10-CM | POA: Diagnosis not present

## 2023-08-11 DIAGNOSIS — R29898 Other symptoms and signs involving the musculoskeletal system: Secondary | ICD-10-CM | POA: Insufficient documentation

## 2023-08-11 DIAGNOSIS — M25551 Pain in right hip: Secondary | ICD-10-CM | POA: Insufficient documentation

## 2023-08-11 NOTE — Therapy (Signed)
 OUTPATIENT PHYSICAL THERAPY LOWER EXTREMITY TREATMENT   Patient Name: Kaitlyn Kirby MRN: [id number] DOB:09-10-1970, 53 y.o., female Today's Date: 08/11/2023  END OF SESSION:  PT End of Session - 08/11/23 1150     Visit Number 3    Number of Visits 24    Authorization Type state health plan [employer] $1500 deduct coins 70/30; copay - none - no auth    PT Start Time 1150    PT Stop Time 1230    PT Time Calculation (min) 40 min    Activity Tolerance Patient tolerated treatment well             Past Medical History:  Diagnosis Date   Abnormal Pap smear 1995   [place]   Allergy    Asthma    seasonal   Basal cell carcinoma 2010   Depression    DM type 2 (diabetes mellitus, type 2) (HCC)    Hemorrhoids    IBS (irritable bowel syndrome) 8-10 years ago   PONV (postoperative nausea and vomiting)    Rectal fissure    Past Surgical History:  Procedure Laterality Date   EYE SURGERY Right 2017   clogged tear duct   HEMORRHOID SURGERY  06/22/2019   MINOR SPHINCTEROTOMY N/A 02/07/2020   Procedure: ANAL SPHINCTEROTOMY.HEMORRHOIDECTOMY.ANORECTAL EXAMINATION UNDER ANESTHESIA, EXCISION OF ANAL POLYPS;  Surgeon: Candyce Champagne, MD;  Location: [facility] [place];  Service: General;  Laterality: N/A;   SKIN CANCER EXCISION  2010   chest, basal cell   WISDOM TOOTH EXTRACTION  53 years old   Patient Active Problem List   Diagnosis Date Noted   Primary osteoarthritis of right hip 07/07/2023   Chronic pain of left thumb 07/07/2023   Perimenopausal symptoms 11/04/2022   Stress incontinence 03/10/2022   Jaundice 01/06/2022   Dyspnea on exertion 01/06/2022   B12 deficiency 10/15/2021   Vitamin D  deficiency 10/15/2021   Encounter for weight management 04/15/2021   OSA (obstructive sleep apnea) 01/23/2021   Hyperlipidemia 08/02/2019   Controlled type 2 diabetes mellitus (HCC) 11/16/2018   Secondary oligomenorrhea 11/11/2018   Dyspareunia in female 02/04/2018   Internal hemorrhoid,  bleeding 12/23/2017   Severe obesity (BMI >= 40) (HCC) 09/14/2013   Lichen simplex chronicus 02/03/2012   Fissure in ano 01/02/2011   Allergic rhinitis due to allergen 12/28/2008   LUMBAGO 04/05/2006   Depression 02/04/2006   Hemorrhoids, external 02/04/2006   Irritable bowel syndrome with constipation 02/04/2006    PCP: Dr Duaine German  REFERRING PROVIDER: Dr Annemarie Kil  REFERRING DIAG: OA R hip   THERAPY DIAG:  Pain in right hip  Other symptoms and signs involving the musculoskeletal system  Muscle weakness (generalized)  Rationale for Evaluation and Treatment: Rehabilitation  ONSET DATE: 05/01/23  SUBJECTIVE:   SUBJECTIVE STATEMENT: Patient has been in [place]  for the past month. She worked on the exercises on land and in the water. She exercised in the water 3-4 times per week which felt great and helped with strengthening. She had significant pain R hip and LB after two days in [place] and the pain increase. Has had more pain in the LB since she drove 12 hours from [place] . She started with the stretching and exercises. Working on exercises at home. She found a strap and a ball to use for exercises. Looking forward to exercises in the water.     EVAL: Patient reports that she has had R hip pain for the past 10 years on an intermittent basis pain is  constant in the past two months. She played sports and feels that she overused the R hip. She played softball - third base - and feels the throwing and batting, running during the time she played sports. Pain is constant with variable intensity  PERTINENT HISTORY: AODM; denies any other medical or musculoskeletal problems   PAIN:  Are you having pain?  Yes: NPRS scale: 1-2/10; worst in the past week 7-8/10 (walking at [employer] and driving 12 hours)  Pain location: R hip - deep sometimes radiating toward pelvic of toward LB Pain description: nagging, tightness Aggravating factors: sitting > 1 hour; walking >  10 min  Relieving factors: rest; changing positions; medication; heat; ice   PRECAUTIONS: None  WEIGHT BEARING RESTRICTIONS: No  FALLS:  Has patient fallen in last 6 months? 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  DIAGNOSTIC FINDINGS: Xray R hip 06/07/23: The hip joint spaces preserved. There is mild acetabular spurring. No evidence of fracture, erosion, or avascular necrosis. Slight degenerative change of the sacroiliac joints. Pubic symphysis is congruent. No evidence of focal bone lesion or bone destruction. Multiple phleboliths in the pelvis.   IMPRESSION: Mild degenerative acetabular spurring of the right hip.  PATIENT SURVEYS:  LEFS 35/80; 43.8%    SENSATION: WFL  EDEMA:  Some edema in R anterior hip on an intermittent basis   MUSCLE LENGTH: Hamstrings: Right 60 deg; Left 65 deg Thomas test: Right -10-15 deg; Left -5-10 deg  POSTURE: knees hyperextended; increased lumbar lordosis   PALPATION: Muscular tightness to palpation through the R anterior hip; hip flexors; groin; posterior hip - piriformis, glut med, ITB to lesser extent glut max  LOWER EXTREMITY ROM:  Active ROM Right eval Left eval  Hip flexion Tight 90 deg Tight 100 deg   Hip extension (-)10-15 (-)5-10  Hip abduction Tight  Tight   Hip adduction    Hip internal rotation ~ 25 deg sitting  ~ 35 deg sitting   Hip external rotation ~20 deg sitting  ~ 20 deg sitting   Knee flexion WFLs WFLs  Knee  extension Hyperextension  Hyperextension   Ankle dorsiflexion    Ankle plantarflexion    Ankle inversion    Ankle eversion     (Blank rows = not tested)  LOWER EXTREMITY MMT:  MMT Right eval Right 08/11/23 Left eval Left  08/11/23  Hip flexion 4- 5- 5- 5  Hip extension 4 4+ 4 5  Hip abduction 4- 4 4 5   Hip adduction      Hip internal rotation      Hip external rotation      Knee flexion 5  5   Knee extension 5  5   Ankle dorsiflexion      Ankle plantarflexion      Ankle inversion      Ankle eversion       (Blank rows = not tested)  LOWER EXTREMITY SPECIAL TESTS:  Hip special tests: FADIR test  positive; Portia Brittle (FABER) test: negative, Trendelenburg test: negative, Thomas test: positive for hip flexor tightness , SI compression test: negative, SI distraction test: negative, and Piriformis test: positive for muscular tightness   FUNCTIONAL TESTS:  5 times sit to stand: 12.34 sec weight shifted to L    GAIT: Distance walked: 40 ft Assistive device utilized: None Level of assistance: Complete Independence Comments: LE's in ER no asymmetry noted   07/14/23: assessment of gait pattern on treadmill shows positive Trendelenburg on R (with wt bearing on R she has drop of L hemipelvis)    [facility] Adult PT Treatment:                                                DATE: 08/11/23 Therapeutic Exercise:  Supine  Piriformis stretch w/ strap 30 sec x 3 Hamstring stretch with strap 30 sec x 3 ITB/lateral HS stretch with strap 30 sec x 3 Sitting Piriformis stretch 30 sec x 3  Sidelying  ITB stretch R LE hanging off table 30 sec x 3  Standing  Therapeutic Activity: Sidelying Hip abduction leading with heel 5 sec x 10 x 2  Clamshell 3 sec x 10 x 2 L  Reverse clamshell 3 sec x 10 x 2 L Standing  Hip abduction blue TB 3 sec x 10  Hip extension standing 3 sec x 10  Side step blue TB (difficulty keeping TB in place - rolling down) Supine  Hip abduction alternating LE's blue TB x 10 x  2   4 part core  Sitting  Anterior posterior pelvic tilt in sitting   Manual:   Deep tissue release work through the R ITB and lateral hip patient in L sidelying in ITB stretch position   Deep tissue work through posterior R hip with PROM hip IR/ER stretch hip extended, knee flexed   Self Care: Myofacial ball release work standing    [facility] Adult PT Treatment:                                                DATE: 07/14/23 Therapeutic Exercise: Treadmill 1.7 mph x 5 min for warm up and evaluation of gait  Supine  Piriformis stretch w/ strap 30 sec x 3 Hamstring stretch with strap 30 sec x 3 ITB/lateral HS stretch with strap 30 sec x 3 Sitting Piriformis stretch 30 sec x 3  Sidelying  Standing  Therapeutic Activity: Sidelying Hip abduction leading with heel 5 sec x 10 x 2  Standing  Hip abduction blue TB 3 sec x 10  Trial of standing isometric hip abduction at wall increased R knee pain Supine  Hip abduction alternating LE's blue TB x 10 x 2   Self Care: Myofacial ball release work standing  TREATMENT DATE:  See HEP for exercises Education and discussion of eval findings and POC     PATIENT EDUCATION:  Education details: POC; HEP  Person educated: Patient Education method: Explanation, Demonstration, Tactile cues, Verbal cues, and Handouts Education comprehension: verbalized understanding, returned demonstration, verbal cues required, tactile cues required, and needs further education  HOME EXERCISE PROGRAM: Access Code: [email or web address] URL: [email or web address] Date: 08/11/2023 Prepared by: [name] Leach  Exercises - Hip Flexor Stretch at Edge of Bed  - 2 x daily - 7 x weekly - 1 sets - 3 reps - 30 sec  hold - Seated Hip Flexor Stretch  - 2 x daily - 7 x weekly - 1 sets - 3 reps - 30 sec  hold - Supine Piriformis Stretch with Leg  Straight  - 2 x daily - 7 x weekly - 1 sets - 3 reps - 30 sec  hold - Supine ITB Stretch with Strap  - 2 x daily - 7 x weekly - 1 sets - 3 reps - 30 sec  hold - Sidelying Hip Abduction  - 1 x daily - 7 x weekly - 3 sets - 10 reps - 3-5 sec  hold - Hooklying Isometric Clamshell  - 1 x daily - 7 x weekly - 1 sets - 10 reps - 3 sec  hold - Standing Hip Abduction with Resistance at Ankles and Counter Support  - 1 x daily - 7 x weekly - 1-2 sets - 10 reps - 3-5 sec  hold - Standing Piriformis Release with Ball at [employer]  - 2 x daily - 7 x weekly - 30-60 sec  hold - Clamshell  - 1 x daily - 7 x weekly - 1-2 sets - 10 reps - 3 sec  hold - Sidelying Reverse Clamshell  - 1 x daily - 7 x weekly - 1-2 sets - 10 reps - 3 sec  hold - Standing Hip Extension with Counter Support  - 2 x daily - 7 x weekly - 1-2 sets - 10 reps - 3-5 sec  hold - Standing Hip Abduction with Counter Support  - 1 x daily - 7 x weekly - 2-3 sets - 10 reps - 2-3 sec  hold - Side Stepping with Resistance at Thighs  - 1 x daily - 7 x weekly - Sidelying ITB Stretch off Table  - 1 x daily - 7 x weekly - 1 sets - 3 reps - 30 sec  hold - Supine Bridge with Resistance Band  - 1 x daily - 7 x weekly - 1-2 sets - 10 reps - 5-10 sec  hold - Supine Transversus Abdominis Bracing with Pelvic Floor Contraction  - 2 x daily - 7 x weekly - 1 sets - 10 reps - 10sec  hold  ASSESSMENT:  CLINICAL IMPRESSION: Note improving gait pattern with increased control on R - no longer notice positive Trendelenburg on R (with wt bearing on R she has drop of L hemipelvis). Patient demonstrates increased strength bilat LEs. She continues to have tightness to palpation through the posterior R hip to ITB and proximal lateral thigh. Treatment consisted of review of stretching and progression of exercises with focus on strengthening of R hip abductors. Progressing well toward stated goals of therapy and will benefit from continued treatment.   EVAL: Patient is a 53 y.o.  female who was seen today for physical therapy evaluation and treatment for R hip OA. She reports intermittent symptoms for at least the past 10 years  but increased pain which is now constant in the past 2 months. She has limited ROM and mobility; decreased strength; pain with palpation R hip in hip flexors, abductors, extensors. She has pain with prolonged sitting, standing, walking. Diagnostic tests show mild degenerative acetabular spurring of the right hip.  Patient will benefit from PT to address problems identified.   OBJECTIVE IMPAIRMENTS: decreased activity tolerance, decreased balance, decreased endurance, decreased mobility, difficulty walking, decreased ROM, decreased strength, increased fascial restrictions, increased muscle spasms, impaired flexibility, postural dysfunction, obesity, and pain.    GOALS: Goals reviewed with patient? Yes  SHORT TERM GOALS: Target date: 08/23/2023   Independent in initial HEP  Baseline: Goal status: met  2.  Increase mobility R hip with hip extension to neutral with posterior pelvic tilt in supine  Baseline:  Goal status: met  3.  Increase R hip strength by 1/2 muscle grade in hip flexion abduction, extension Baseline:  Goal status: met   LONG TERM GOALS: Target date: 10/04/2023    Decrease R hip pain to 0-2/10 for functional activities including sitting or standing > one hour Baseline:  Goal status: INITIAL  2.  Increase strength R hip to 4+/5 to 5/5  Baseline:  Goal status: INITIAL  3.  Increase sitting time to 60-80 min with minimal to no increase in hip pain Baseline:  Goal status: INITIAL  4.  Increase standing and walking tolerance to 60-80 minutes  Baseline:  Goal status: INITIAL  5.  Independent in HEP including aquatic program as indicated  Baseline:  Goal status: INITIAL  6.  Improve LEFS by 20 points  Baseline: 35/80; 43.8%  Goal status: INITIAL   PLAN:  PT FREQUENCY: 2x/week  PT DURATION: 8 weeks  PLANNED  INTERVENTIONS: 97110-Therapeutic exercises, 97530- Therapeutic activity, 97112- Neuromuscular re-education, 97535- Self Care, 16109- Manual therapy, [phone]- Gait training, [phone]- Aquatic Therapy, [phone]- Ionotophoresis 4mg /ml Dexamethasone , Patient/Family education, Balance training, Stair training, Taping, Dry Needling, and Joint mobilization  PLAN FOR NEXT SESSION: review and progress exercises; education; manual work and modalities as indicated -strengthening glut med    Angela Kell, PT 08/11/2023, 11:51 AM

## 2023-08-12 ENCOUNTER — Ambulatory Visit: Admitting: Sports Medicine

## 2023-08-13 ENCOUNTER — Ambulatory Visit: Payer: Self-pay | Admitting: Family Medicine

## 2023-08-13 ENCOUNTER — Ambulatory Visit: Admitting: Sports Medicine

## 2023-08-13 NOTE — Progress Notes (Signed)
 Please call patient. Normal mammogram.  Repeat in 1 year.

## 2023-08-17 ENCOUNTER — Ambulatory Visit: Admitting: Sports Medicine

## 2023-08-18 ENCOUNTER — Encounter (HOSPITAL_BASED_OUTPATIENT_CLINIC_OR_DEPARTMENT_OTHER): Payer: Self-pay

## 2023-08-18 ENCOUNTER — Ambulatory Visit (HOSPITAL_BASED_OUTPATIENT_CLINIC_OR_DEPARTMENT_OTHER): Admitting: Physical Therapy

## 2023-08-19 ENCOUNTER — Ambulatory Visit: Admitting: Rehabilitative and Restorative Service Providers"

## 2023-08-19 ENCOUNTER — Encounter: Payer: Self-pay | Admitting: Rehabilitative and Restorative Service Providers"

## 2023-08-19 DIAGNOSIS — R29898 Other symptoms and signs involving the musculoskeletal system: Secondary | ICD-10-CM

## 2023-08-19 DIAGNOSIS — M6281 Muscle weakness (generalized): Secondary | ICD-10-CM

## 2023-08-19 DIAGNOSIS — M25551 Pain in right hip: Secondary | ICD-10-CM

## 2023-08-19 NOTE — Therapy (Signed)
 OUTPATIENT PHYSICAL THERAPY LOWER EXTREMITY TREATMENT   Patient Name: Kaitlyn Kirby MRN: [id number] DOB:[birth year], 53 y.o., female Today's Date: 08/19/2023  END OF SESSION:  PT End of Session - 08/19/23 0804     Visit Number 4    Number of Visits 24    Date for PT Re-Evaluation 10/04/23    Authorization Type state health plan [employer] $1500 deduct coins 70/30; copay - none - no auth    PT Start Time 0801    PT Stop Time 0845    PT Time Calculation (min) 44 min    Activity Tolerance Patient tolerated treatment well             Past Medical History:  Diagnosis Date   Abnormal Pap smear 1995   [place]   Allergy    Asthma    seasonal   Basal cell carcinoma 2010   Depression    DM type 2 (diabetes mellitus, type 2) (HCC)    Hemorrhoids    IBS (irritable bowel syndrome) 8-10 years ago   PONV (postoperative nausea and vomiting)    Rectal fissure    Past Surgical History:  Procedure Laterality Date   EYE SURGERY Right 2017   clogged tear duct   HEMORRHOID SURGERY  06/22/2019   MINOR SPHINCTEROTOMY N/A 02/07/2020   Procedure: ANAL SPHINCTEROTOMY.HEMORRHOIDECTOMY.ANORECTAL EXAMINATION UNDER ANESTHESIA, EXCISION OF ANAL POLYPS;  Surgeon: Candyce Champagne, MD;  Location: [facility] [place];  Service: General;  Laterality: N/A;   SKIN CANCER EXCISION  2010   chest, basal cell   WISDOM TOOTH EXTRACTION  53 years old   Patient Active Problem List   Diagnosis Date Noted   Primary osteoarthritis of right hip 07/07/2023   Chronic pain of left thumb 07/07/2023   Perimenopausal symptoms 11/04/2022   Stress incontinence 03/10/2022   Jaundice 01/06/2022   Dyspnea on exertion 01/06/2022   B12 deficiency 10/15/2021   Vitamin D  deficiency 10/15/2021   Encounter for weight management 04/15/2021   OSA (obstructive sleep apnea) 01/23/2021   Hyperlipidemia 08/02/2019   Controlled type 2 diabetes mellitus (HCC) 11/16/2018   Secondary oligomenorrhea 11/11/2018   Dyspareunia in  female 02/04/2018   Internal hemorrhoid, bleeding 12/23/2017   Severe obesity (BMI >= 40) (HCC) 09/14/2013   Lichen simplex chronicus 02/03/2012   Fissure in ano 01/02/2011   Allergic rhinitis due to allergen 12/28/2008   LUMBAGO 04/05/2006   Depression 02/04/2006   Hemorrhoids, external 02/04/2006   Irritable bowel syndrome with constipation 02/04/2006    PCP: Dr Duaine German  REFERRING PROVIDER: Dr Annemarie Kil  REFERRING DIAG: OA R hip   THERAPY DIAG:  Pain in right hip  Other symptoms and signs involving the musculoskeletal system  Muscle weakness (generalized)  Rationale for Evaluation and Treatment: Rehabilitation  ONSET DATE: 05/01/23  SUBJECTIVE:   SUBJECTIVE STATEMENT: Patient reports that she has been moving boxes and furniture. She has continued tightness in the LB from the long car ride from [place]  trip.  Working on exercises at home. Hip is tighter and more irritated today than last visit. She found a strap and a ball to use for exercises. Looking forward to exercises in the water. Patient reports that she does have some incontinence with coughing, sneezing, sometimes laughing.     EVAL: Patient reports that she has had R hip pain for the past 10 years on an intermittent basis pain is constant in the past two months. She played sports and feels that she overused the R hip. She  played softball - third base - and feels the throwing and batting, running during the time she played sports. Pain is constant with variable intensity  PERTINENT HISTORY: AODM; denies any other medical or musculoskeletal problems   PAIN:  Are you having pain?  Yes: NPRS scale: 5/10; worst in the past week 7-8/10 (walking at [employer] and driving 12 hours)  Pain location: R hip - deep sometimes radiating toward pelvic of toward LB Pain description: nagging, tightness Aggravating factors: sitting > 1 hour; walking > 10 min  Relieving factors: rest; changing positions;  medication; heat; ice   PRECAUTIONS: None  WEIGHT BEARING RESTRICTIONS: No  FALLS:  Has patient fallen in last 6 months? 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  DIAGNOSTIC FINDINGS: Xray R hip 06/07/23: The hip joint spaces preserved. There is mild acetabular spurring. No evidence of fracture, erosion, or avascular necrosis. Slight degenerative change of the sacroiliac joints. Pubic symphysis is congruent. No evidence of focal bone lesion or bone destruction. Multiple phleboliths in the pelvis.   IMPRESSION: Mild degenerative acetabular spurring of the right hip.  PATIENT SURVEYS:  LEFS 35/80; 43.8%    SENSATION: WFL  EDEMA:  Some edema in R anterior hip on an intermittent basis   MUSCLE LENGTH: Hamstrings: Right 60 deg; Left 65 deg Thomas test: Right -10-15 deg; Left -5-10 deg  POSTURE: knees hyperextended; increased lumbar lordosis   PALPATION: Muscular tightness to palpation through the R anterior hip; hip flexors; groin; posterior hip - piriformis, glut med, ITB to lesser extent glut max  LOWER EXTREMITY ROM:  Active ROM Right eval Left eval  Hip flexion Tight 90 deg Tight 100 deg   Hip extension (-)10-15 (-)5-10  Hip abduction Tight  Tight   Hip adduction    Hip internal rotation ~ 25 deg sitting  ~ 35 deg sitting   Hip external rotation ~20 deg sitting  ~ 20 deg sitting   Knee flexion WFLs WFLs  Knee extension Hyperextension  Hyperextension   Ankle  dorsiflexion    Ankle plantarflexion    Ankle inversion    Ankle eversion     (Blank rows = not tested)  LOWER EXTREMITY MMT:  MMT Right eval Right 08/11/23 Left eval Left  08/11/23  Hip flexion 4- 5- 5- 5  Hip extension 4 4+ 4 5  Hip abduction 4- 4 4 5   Hip adduction      Hip internal rotation      Hip external rotation      Knee flexion 5  5   Knee extension 5  5   Ankle dorsiflexion      Ankle plantarflexion      Ankle inversion      Ankle eversion       (Blank rows = not tested)  LOWER EXTREMITY SPECIAL TESTS:  Hip special tests: FADIR test positive; Patrick (FABER) test: negative, Trendelenburg test: negative, Thomas test: positive for hip flexor tightness , SI compression  test: negative, SI distraction test: negative, and Piriformis test: positive for muscular tightness   FUNCTIONAL TESTS:  5 times sit to stand: 12.34 sec weight shifted to L    GAIT: Distance walked: 40 ft Assistive device utilized: None Level of assistance: Complete Independence Comments: LE's in ER no asymmetry noted   07/14/23: assessment of gait pattern on treadmill shows positive Trendelenburg on R (with wt bearing on R she has drop of L hemipelvis)    [facility] Adult PT Treatment:                                                DATE: 08/19/23 Therapeutic Exercise:  Supine  Piriformis stretch w/ strap 30 sec x 3 Hamstring stretch with strap 30 sec x 3 ITB/lateral HS stretch with strap 30 sec x 3 Sitting Piriformis stretch 30 sec x 3  Sidelying  ITB stretch R LE hanging off table 30 sec x 3  Standing  Therapeutic Activity:  Standing  Trunk flexion rolling over green therapeutic ball on lowered table to stretch lumbar spine 20 sec x 3  Side steps with blue TB crossed monster walk R/L x 5 ft x 6  Supine  4 part core (difficulty with pelvic floor contraction)  Hip abduction alternating LE's blue TB x 10 x 2   Bridging with blue TB distal thighs 3 sec x 10  Sitting  Trunk flexion in  chair Anterior posterior pelvic tilt in sitting  Quadruped  Cat camel x 10 (difficulty WB through UE's) Child's pose 20 sec x 3 (difficulty WB through UE's) Manual:   Deep tissue release work through the R ITB and lateral hip patient in L sidelying in ITB stretch position   Deep tissue work through posterior R hip with PROM hip IR/ER stretch hip extended, knee flexed   Self Care: Myofacial ball release work standing    [facility] Adult PT Treatment:                                                DATE: 08/11/23 Therapeutic Exercise:  Supine  Piriformis stretch w/ strap 30 sec x 3 Hamstring stretch with strap 30 sec x 3 ITB/lateral HS stretch with strap 30 sec x 3 Sitting Piriformis stretch 30 sec x 3  Sidelying  ITB stretch R LE hanging off table 30 sec x 3  Standing  Therapeutic Activity: Sidelying Hip abduction leading with heel 5 sec x 10 x 2  Clamshell 3 sec x 10 x 2 L  Reverse clamshell 3 sec x 10 x 2 L Standing  Hip abduction blue TB 3 sec x 10  Hip extension standing 3 sec x 10  Side step blue TB (difficulty keeping TB in place - rolling down) Supine  Hip abduction alternating LE's blue TB x 10 x 2   4 part core  Sitting  Anterior posterior pelvic tilt in sitting   Manual:   Deep tissue release work through the R ITB and lateral hip patient in L sidelying in ITB stretch position   Deep tissue work through posterior R hip with PROM hip IR/ER stretch hip extended, knee flexed   Self Care: Myofacial ball release work standing    [facility] Adult  PT Treatment:                                                DATE: 07/14/23 Therapeutic Exercise: Treadmill 1.7 mph x 5 min for warm up and evaluation of gait  Supine  Piriformis stretch w/ strap 30 sec x 3 Hamstring stretch with strap 30 sec x 3 ITB/lateral HS stretch with strap 30 sec x 3 Sitting Piriformis stretch 30 sec x 3  Sidelying  Standing  Therapeutic Activity: Sidelying Hip abduction leading with heel 5 sec x 10 x  2  Standing  Hip abduction blue TB 3 sec x 10  Trial of standing isometric hip abduction at wall increased R knee pain Supine  Hip abduction alternating LE's blue TB x 10 x 2   Self Care: Myofacial ball release work standing                                                                                                                                TREATMENT DATE:  See HEP for exercises Education and discussion of eval findings and POC     PATIENT EDUCATION:  Education details: POC; HEP  Person educated: Patient Education method: Programmer, multimedia, Demonstration, Actor cues, Verbal cues, and Handouts Education comprehension: verbalized understanding, returned demonstration, verbal cues required, tactile cues required, and needs further education  HOME EXERCISE PROGRAM: Access Code: [email or web address] URL: [email or web address] Date: 08/19/2023 Prepared by: Sheilyn Boehlke  Exercises - Hip Flexor Stretch at [facility] of Bed  - 2 x daily - 7 x weekly - 1 sets - 3 reps - 30 sec  hold - Seated Hip Flexor Stretch  - 2 x daily - 7 x weekly - 1 sets - 3 reps - 30 sec  hold - Supine Piriformis Stretch with Leg Straight  - 2 x daily - 7 x weekly - 1 sets - 3 reps - 30 sec  hold - Supine ITB Stretch with Strap  - 2 x daily - 7 x weekly - 1 sets - 3 reps - 30 sec  hold - Sidelying Hip Abduction  - 1 x daily - 7 x weekly - 3 sets - 10 reps - 3-5 sec  hold - Hooklying Isometric Clamshell  - 1 x daily - 7 x weekly - 1 sets - 10 reps - 3 sec  hold - Standing Hip Abduction with Resistance at Ankles and Counter Support  - 1 x daily - 7 x weekly - 1-2 sets - 10 reps - 3-5 sec  hold - Standing Piriformis Release with Ball at [employer]  - 2 x daily - 7 x weekly - 30-60 sec  hold - Clamshell  - 1 x daily - 7 x weekly - 1-2 sets - 10 reps - 3 sec  hold - Sidelying Reverse Clamshell  -  1 x daily - 7 x weekly - 1-2 sets - 10 reps - 3 sec  hold - Standing Hip Extension with Counter Support  - 2 x daily - 7 x weekly -  1-2 sets - 10 reps - 3-5 sec  hold - Standing Hip Abduction with Counter Support  - 1 x daily - 7 x weekly - 2-3 sets - 10 reps - 2-3 sec  hold - Side Stepping with Resistance at Thighs  - 1 x daily - 7 x weekly - Sidelying ITB Stretch off Table  - 1 x daily - 7 x weekly - 1 sets - 3 reps - 30 sec  hold - Supine Bridge with Resistance Band  - 1 x daily - 7 x weekly - 1-2 sets - 10 reps - 5-10 sec  hold - Supine Transversus Abdominis Bracing with Pelvic Floor Contraction  - 2 x daily - 7 x weekly - 1 sets - 10 reps - 10sec  hold - Prone Flexion Stretch on Swiss Ball  - 2 x daily - 7 x weekly - 1 sets - 3 reps - 30 sec  hold - Seated Flexion Stretch  - 2 x daily - 7 x weekly - 1 sets - 3 reps - 30 sec  hold - Side Stepping with Resistance at Ankles and Counter Support  - 1 x daily - 7 x weekly - 1 sets -  sec  hold  ASSESSMENT:  CLINICAL IMPRESSION: Increased tightness in the R hip and LB which patient feels that is related to moving boxes and furniture at home. Treatment consisted of review of stretching and progression of exercises with focus on strengthening for lumbar spine. Good release of muscular tightness in low back with stretches. Noted improved SLS with less hip drop on opposite side. Good progress with strengthening; improving gait pattern. Discussed referral for pelvic floor evaluation to address incontinence. Progressing well toward stated goals of therapy and will benefit from continued treatment.   EVAL: Patient is a 53 y.o. female who was seen today for physical therapy evaluation and treatment for R hip OA. She reports intermittent symptoms for at least the past 10 years but increased pain which is now constant in the past 2 months. She has limited ROM and mobility; decreased strength; pain with palpation R hip in hip flexors, abductors, extensors. She has pain with prolonged sitting, standing, walking. Diagnostic tests show mild degenerative acetabular spurring of the right hip.   Patient will benefit from PT to address problems identified.   OBJECTIVE IMPAIRMENTS: decreased activity tolerance, decreased balance, decreased endurance, decreased mobility, difficulty walking, decreased ROM, decreased strength, increased fascial restrictions, increased muscle spasms, impaired flexibility, postural dysfunction, obesity, and pain.    GOALS: Goals reviewed with patient? Yes  SHORT TERM GOALS: Target date: 08/23/2023   Independent in initial HEP  Baseline: Goal status: met  2.  Increase mobility R hip with hip extension to neutral with posterior pelvic tilt in supine  Baseline:  Goal status: met  3.  Increase R hip strength by 1/2 muscle grade in hip flexion abduction, extension Baseline:  Goal status: met   LONG TERM GOALS: Target date: 10/04/2023    Decrease R hip pain to 0-2/10 for functional activities including sitting or standing > one hour Baseline:  Goal status: INITIAL  2.  Increase strength R hip to 4+/5 to 5/5  Baseline:  Goal status: INITIAL  3.  Increase sitting time to 60-80 min with minimal to no increase  in hip pain Baseline:  Goal status: INITIAL  4.  Increase standing and walking tolerance to 60-80 minutes  Baseline:  Goal status: INITIAL  5.  Independent in HEP including aquatic program as indicated  Baseline:  Goal status: INITIAL  6.  Improve LEFS by 20 points  Baseline: 35/80; 43.8%  Goal status: INITIAL   PLAN:  PT FREQUENCY: 2x/week  PT DURATION: 8 weeks  PLANNED INTERVENTIONS: 97110-Therapeutic exercises, 97530- Therapeutic activity, 97112- Neuromuscular re-education, 97535- Self Care, 91478- Manual therapy, [phone]- Gait training, [phone]- Aquatic Therapy, [phone]- Ionotophoresis 4mg /ml Dexamethasone , Patient/Family education, Balance training, Stair training, Taping, Dry Needling, and Joint mobilization  PLAN FOR NEXT SESSION: review and progress exercises; education; manual work and modalities as indicated -strengthening  glut med    [employer], PT 08/19/2023, 8:04 AM

## 2023-08-20 ENCOUNTER — Encounter (HOSPITAL_BASED_OUTPATIENT_CLINIC_OR_DEPARTMENT_OTHER): Payer: Self-pay | Admitting: Physical Therapy

## 2023-08-20 ENCOUNTER — Ambulatory Visit (HOSPITAL_BASED_OUTPATIENT_CLINIC_OR_DEPARTMENT_OTHER): Attending: Sports Medicine | Admitting: Physical Therapy

## 2023-08-20 DIAGNOSIS — M6281 Muscle weakness (generalized): Secondary | ICD-10-CM | POA: Diagnosis present

## 2023-08-20 DIAGNOSIS — M25551 Pain in right hip: Secondary | ICD-10-CM | POA: Diagnosis present

## 2023-08-20 DIAGNOSIS — R29898 Other symptoms and signs involving the musculoskeletal system: Secondary | ICD-10-CM | POA: Insufficient documentation

## 2023-08-20 NOTE — Therapy (Signed)
 OUTPATIENT PHYSICAL THERAPY LOWER EXTREMITY TREATMENT   Patient Name: Kaitlyn Kirby MRN: [id number] DOB:[birth year], 53 y.o., female Today's Date: 08/20/2023  END OF SESSION:  PT End of Session - 08/20/23 0945     Visit Number 5    Number of Visits 24    Date for PT Re-Evaluation 10/04/23    Authorization Type state health plan [employer] $1500 deduct coins 70/30; copay - none - no auth    PT Start Time 0931    PT Stop Time 1014    PT Time Calculation (min) 43 min    Activity Tolerance Patient tolerated treatment well             Past Medical History:  Diagnosis Date   Abnormal Pap smear 1995   [place]   Allergy    Asthma    seasonal   Basal cell carcinoma 2010   Depression    DM type 2 (diabetes mellitus, type 2) (HCC)    Hemorrhoids    IBS (irritable bowel syndrome) 8-10 years ago   PONV (postoperative nausea and vomiting)    Rectal fissure    Past Surgical History:  Procedure Laterality Date   EYE SURGERY Right 2017   clogged tear duct   HEMORRHOID SURGERY  06/22/2019   MINOR SPHINCTEROTOMY N/A 02/07/2020   Procedure: ANAL SPHINCTEROTOMY.HEMORRHOIDECTOMY.ANORECTAL EXAMINATION UNDER ANESTHESIA, EXCISION OF ANAL POLYPS;  Surgeon: Candyce Champagne, MD;  Location: [facility];  Service: General;  Laterality: N/A;   SKIN CANCER EXCISION  2010   chest, basal cell   Kirby TOOTH EXTRACTION  53 years old   Patient Active Problem List   Diagnosis Date Noted   Primary osteoarthritis of right hip 07/07/2023   Chronic pain of left thumb 07/07/2023   Perimenopausal symptoms 11/04/2022   Stress incontinence 03/10/2022   Jaundice 01/06/2022   Dyspnea on exertion 01/06/2022   B12 deficiency 10/15/2021   Vitamin D  deficiency 10/15/2021   Encounter for weight management 04/15/2021   OSA (obstructive sleep apnea) 01/23/2021   Hyperlipidemia 08/02/2019   Controlled type 2 diabetes mellitus (HCC) 11/16/2018   Secondary oligomenorrhea 11/11/2018   Dyspareunia in  female 02/04/2018   Internal hemorrhoid, bleeding 12/23/2017   Severe obesity (BMI >= 40) (HCC) 09/14/2013   Lichen simplex chronicus 02/03/2012   Fissure in ano 01/02/2011   Allergic rhinitis due to allergen 12/28/2008   LUMBAGO 04/05/2006   Depression 02/04/2006   Hemorrhoids, external 02/04/2006   Irritable bowel syndrome with constipation 02/04/2006    PCP: Dr Duaine German  REFERRING PROVIDER: Dr Annemarie Kil  REFERRING DIAG: OA R hip   THERAPY DIAG:  Pain in right hip  Other symptoms and signs involving the musculoskeletal system  Muscle weakness (generalized)  Rationale for Evaluation and Treatment: Rehabilitation  ONSET DATE: 05/01/23  SUBJECTIVE:   SUBJECTIVE STATEMENT: Pt reports she is interested in learning some exercises that she can complete in pool.  Has access to 2 pools.       EVAL: Patient reports that she has had R hip pain for the past 10 years on an intermittent basis pain is constant in the past two months. She played sports and feels that she overused the R hip. She played softball - third base - and feels the throwing and batting, running during the time she played sports. Pain is constant with variable intensity  PERTINENT HISTORY: AODM; denies any other medical or musculoskeletal problems   PAIN:  Are you having pain?  Yes: NPRS scale: 4/10;  worst in the past week 7-8/10 (walking at [employer] and driving 12 hours)  Pain location: R hip - deep sometimes radiating toward pelvic of toward LB Pain description: nagging, tightness Aggravating factors: sitting > 1 hour; walking > 10 min  Relieving factors: rest; changing positions; medication; heat; ice   PRECAUTIONS: None  WEIGHT BEARING RESTRICTIONS: No  FALLS:  Has patient fallen in last 6 months? 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  DIAGNOSTIC FINDINGS: Xray R hip 06/07/23: The hip joint spaces preserved. There is mild acetabular spurring. No evidence of fracture, erosion, or avascular necrosis. Slight degenerative change of the sacroiliac joints. Pubic symphysis is congruent. No evidence of focal bone lesion or bone destruction. Multiple phleboliths in the pelvis.   IMPRESSION: Mild degenerative acetabular spurring of the right hip.  PATIENT SURVEYS:  LEFS 35/80; 43.8%    SENSATION: WFL  EDEMA:  Some edema in R anterior hip on an intermittent basis   MUSCLE LENGTH: Hamstrings: Right 60 deg; Left 65 deg Thomas test: Right -10-15 deg; Left -5-10 deg  POSTURE: knees hyperextended; increased lumbar lordosis   PALPATION: Muscular tightness to palpation through the R anterior hip; hip flexors; groin; posterior hip - piriformis, glut med, ITB to lesser extent glut max  LOWER EXTREMITY ROM:  Active ROM Right eval Left eval  Hip flexion Tight 90 deg Tight 100 deg   Hip extension (-)10-15 (-)5-10  Hip abduction Tight  Tight   Hip adduction    Hip internal rotation ~ 25 deg sitting  ~ 35 deg sitting   Hip external rotation ~20 deg sitting  ~ 20 deg sitting   Knee flexion WFLs WFLs  Knee extension Hyperextension  Hyperextension   Ankle dorsiflexion    Ankle plantarflexion    Ankle inversion    Ankle eversion     (Blank rows = not tested)  LOWER EXTREMITY MMT:  MMT Right eval Right 08/11/23 Left eval Left  08/11/23  Hip flexion 4- 5- 5- 5  Hip extension 4 4+ 4 5  Hip abduction 4- 4 4 5   Hip adduction      Hip internal rotation       Hip external rotation      Knee flexion 5  5   Knee extension 5  5   Ankle dorsiflexion      Ankle plantarflexion      Ankle inversion      Ankle eversion       (Blank rows = not tested)  LOWER EXTREMITY SPECIAL TESTS:  Hip special tests: FADIR test positive; Portia Brittle (FABER) test: negative, Trendelenburg test: negative, Thomas test: positive for hip flexor tightness , SI compression test: negative, SI distraction test: negative, and Piriformis test: positive for muscular tightness   FUNCTIONAL TESTS:  5 times sit to stand: 12.34 sec weight shifted to L    GAIT: Distance walked: 40 ft Assistive device utilized: None Level of assistance: Complete Independence Comments: LE's in  ER no asymmetry noted   07/14/23: assessment of gait pattern on treadmill shows positive Trendelenburg on R (with wt bearing on R she has drop of L hemipelvis)   [facility] Adult PT Treatment:                                             08/20/23 Pt seen for aquatic therapy today.  Treatment took place in water 3.5-4.75 ft in depth at the [facility] pool. Temp of water was 91.  Pt entered/exited the pool via stairs independently with bilat rail.  - Intro to aquatic therapy principles - unsupported walking forward/ backward, multiple laps, cues for core engagement and vertical trunk - side stepping -> with arm addct/ abdct with rainbow-> yellow hand floats -> into wide squat - UE on wall:  hip add/abd 2 x 10 (trial of crossing midline) ; hip flexion /extension x 10 each LE, cues to monitor height of LE; straight LE hip circles with toes in contact with ground  - TrA set with hollow long noodle-->solid noodle-> kick board-> rainbow hand floats-> yellow hand floats pull down to thighs x 5 each - straddling noodle with no UE support: cycling  - 3 way LE stretch with ankle supported by hollow noodle - quad/hip flexor stretch with ankle supported by noodle    Pt requires the buoyancy and hydrostatic pressure  of water for support, and to offload joints by unweighting joint load by at least 50 % in navel deep water and by at least 75-80% in chest to neck deep water.  Viscosity of the water is needed for resistance of strengthening. Water current perturbations provides challenge to standing balance requiring increased core activation.    [facility] Adult PT Treatment:                                                DATE: 08/19/23 Therapeutic Exercise:  Supine  Piriformis stretch w/ strap 30 sec x 3 Hamstring stretch with strap 30 sec x 3 ITB/lateral HS stretch with strap 30 sec x 3 Sitting Piriformis stretch 30 sec x 3  Sidelying  ITB stretch R LE hanging off table 30 sec x 3  Standing  Therapeutic Activity:  Standing  Trunk flexion rolling over green therapeutic ball on lowered table to stretch lumbar spine 20 sec x 3  Side steps with blue TB crossed monster walk R/L x 5 ft x 6  Supine  4 part core (difficulty with pelvic floor contraction)  Hip abduction alternating LE's blue TB x 10 x 2   Bridging with blue TB distal thighs 3 sec x 10  Sitting  Trunk flexion in chair Anterior posterior pelvic tilt in sitting  Quadruped  Cat camel x 10 (difficulty WB through UE's) Child's pose 20 sec x 3 (difficulty WB through UE's) Manual:   Deep tissue release work through the R ITB and lateral hip patient in L sidelying in ITB stretch position   Deep tissue work through posterior R hip with PROM hip IR/ER stretch hip extended, knee flexed   Self Care: Myofacial ball release work standing    [facility] Adult PT Treatment:  DATE: 08/11/23 Therapeutic Exercise:  Supine  Piriformis stretch w/ strap 30 sec x 3 Hamstring stretch with strap 30 sec x 3 ITB/lateral HS stretch with strap 30 sec x 3 Sitting Piriformis stretch 30 sec x 3  Sidelying  ITB stretch R LE hanging off table 30 sec x 3  Standing  Therapeutic Activity: Sidelying Hip abduction leading with  heel 5 sec x 10 x 2  Clamshell 3 sec x 10 x 2 L  Reverse clamshell 3 sec x 10 x 2 L Standing  Hip abduction blue TB 3 sec x 10  Hip extension standing 3 sec x 10  Side step blue TB (difficulty keeping TB in place - rolling down) Supine  Hip abduction alternating LE's blue TB x 10 x 2   4 part core  Sitting  Anterior posterior pelvic tilt in sitting   Manual:   Deep tissue release work through the R ITB and lateral hip patient in L sidelying in ITB stretch position   Deep tissue work through posterior R hip with PROM hip IR/ER stretch hip extended, knee flexed   Self Care: Myofacial ball release work standing    [facility] Adult PT Treatment:                                                DATE: 07/14/23 Therapeutic Exercise: Treadmill 1.7 mph x 5 min for warm up and evaluation of gait  Supine  Piriformis stretch w/ strap 30 sec x 3 Hamstring stretch with strap 30 sec x 3 ITB/lateral HS stretch with strap 30 sec x 3 Sitting Piriformis stretch 30 sec x 3  Sidelying  Standing  Therapeutic Activity: Sidelying Hip abduction leading with heel 5 sec x 10 x 2  Standing  Hip abduction blue TB 3 sec x 10  Trial of standing isometric hip abduction at wall increased R knee pain Supine  Hip abduction alternating LE's blue TB x 10 x 2   Self Care: Myofacial ball release work standing                                                                                                                                TREATMENT DATE:  See HEP for exercises Education and discussion of eval findings and POC     PATIENT EDUCATION:  Education details: POC; HEP  Person educated: Patient Education method: Programmer, multimedia, Demonstration, Actor cues, Verbal cues, and Handouts Education comprehension: verbalized understanding, returned demonstration, verbal cues required, tactile cues required, and needs further education  HOME EXERCISE PROGRAM: Access Code: [email or web address] URL:  [email or web address] Date: 08/19/2023 Prepared by: Celyn Holt  Exercises - Hip Flexor Stretch at [facility] of Bed  - 2 x daily - 7 x weekly - 1 sets - 3 reps - 30 sec  hold - Seated Hip  Flexor Stretch  - 2 x daily - 7 x weekly - 1 sets - 3 reps - 30 sec  hold - Supine Piriformis Stretch with Leg Straight  - 2 x daily - 7 x weekly - 1 sets - 3 reps - 30 sec  hold - Supine ITB Stretch with Strap  - 2 x daily - 7 x weekly - 1 sets - 3 reps - 30 sec  hold - Sidelying Hip Abduction  - 1 x daily - 7 x weekly - 3 sets - 10 reps - 3-5 sec  hold - Hooklying Isometric Clamshell  - 1 x daily - 7 x weekly - 1 sets - 10 reps - 3 sec  hold - Standing Hip Abduction with Resistance at Ankles and Counter Support  - 1 x daily - 7 x weekly - 1-2 sets - 10 reps - 3-5 sec  hold - Standing Piriformis Release with Ball at [employer]  - 2 x daily - 7 x weekly - 30-60 sec  hold - Clamshell  - 1 x daily - 7 x weekly - 1-2 sets - 10 reps - 3 sec  hold - Sidelying Reverse Clamshell  - 1 x daily - 7 x weekly - 1-2 sets - 10 reps - 3 sec  hold - Standing Hip Extension with Counter Support  - 2 x daily - 7 x weekly - 1-2 sets - 10 reps - 3-5 sec  hold - Standing Hip Abduction with Counter Support  - 1 x daily - 7 x weekly - 2-3 sets - 10 reps - 2-3 sec  hold - Side Stepping with Resistance at Thighs  - 1 x daily - 7 x weekly - Sidelying ITB Stretch off Table  - 1 x daily - 7 x weekly - 1 sets - 3 reps - 30 sec  hold - Supine Bridge with Resistance Band  - 1 x daily - 7 x weekly - 1-2 sets - 10 reps - 5-10 sec  hold - Supine Transversus Abdominis Bracing with Pelvic Floor Contraction  - 2 x daily - 7 x weekly - 1 sets - 10 reps - 10sec  hold - Prone Flexion Stretch on Swiss Ball  - 2 x daily - 7 x weekly - 1 sets - 3 reps - 30 sec  hold - Seated Flexion Stretch  - 2 x daily - 7 x weekly - 1 sets - 3 reps - 30 sec  hold - Side Stepping with Resistance at Ankles and Counter Support  - 1 x daily - 7 x weekly - 1 sets -  sec   hold  ASSESSMENT:  CLINICAL IMPRESSION: Pt demonstrates safety and independence in aquatic setting with therapist instructing from deck. Pt demonstrates confidence in setting, moving throughout all depths easily.  Pt is directed through various movement patterns and trials in standing position. She reported increase pain in R lateral hip at rep 6 of hip abdct, and pain in lower back with leg swings into R hip flexion.  Pain eased with change in exercise. Overall, pain significantly reduced when submerged 60%. Will continue to explore exercises in pool and create aquatic HEP.   Goals are ongoing. Progressing well toward stated goals of therapy and will benefit from continued treatment.      EVAL: Patient is a 53 y.o. female who was seen today for physical therapy evaluation and treatment for R hip OA. She reports intermittent symptoms for at least the past 10 years but increased  pain which is now constant in the past 2 months. She has limited ROM and mobility; decreased strength; pain with palpation R hip in hip flexors, abductors, extensors. She has pain with prolonged sitting, standing, walking. Diagnostic tests show mild degenerative acetabular spurring of the right hip.  Patient will benefit from PT to address problems identified.   OBJECTIVE IMPAIRMENTS: decreased activity tolerance, decreased balance, decreased endurance, decreased mobility, difficulty walking, decreased ROM, decreased strength, increased fascial restrictions, increased muscle spasms, impaired flexibility, postural dysfunction, obesity, and pain.    GOALS: Goals reviewed with patient? Yes  SHORT TERM GOALS: Target date: 08/23/2023   Independent in initial HEP  Baseline: Goal status: met  2.  Increase mobility R hip with hip extension to neutral with posterior pelvic tilt in supine  Baseline:  Goal status: met  3.  Increase R hip strength by 1/2 muscle grade in hip flexion abduction, extension Baseline:  Goal  status: met   LONG TERM GOALS: Target date: 10/04/2023    Decrease R hip pain to 0-2/10 for functional activities including sitting or standing > one hour Baseline:  Goal status: INITIAL  2.  Increase strength R hip to 4+/5 to 5/5  Baseline:  Goal status: INITIAL  3.  Increase sitting time to 60-80 min with minimal to no increase in hip pain Baseline:  Goal status: INITIAL  4.  Increase standing and walking tolerance to 60-80 minutes  Baseline:  Goal status: INITIAL  5.  Independent in HEP including aquatic program as indicated  Baseline:  Goal status: INITIAL  6.  Improve LEFS by 20 points  Baseline: 35/80; 43.8%  Goal status: INITIAL   PLAN:  PT FREQUENCY: 2x/week  PT DURATION: 8 weeks  PLANNED INTERVENTIONS: 97110-Therapeutic exercises, 97530- Therapeutic activity, 97112- Neuromuscular re-education, 97535- Self Care, 16109- Manual therapy, [phone]- Gait training, [phone]- Aquatic Therapy, [phone]- Ionotophoresis 4mg /ml Dexamethasone , Patient/Family education, Balance training, Stair training, Taping, Dry Needling, and Joint mobilization  PLAN FOR NEXT SESSION: review and progress exercises; education; manual work and modalities as indicated -strengthening glut med   Almedia Jacobsen, PTA 08/20/23 12:52 PM [facility] [address] [place], [place], [address] Phone: [phone]   Fax:  [phone]

## 2023-08-24 ENCOUNTER — Ambulatory Visit

## 2023-08-24 ENCOUNTER — Ambulatory Visit (INDEPENDENT_AMBULATORY_CARE_PROVIDER_SITE_OTHER): Admitting: Sports Medicine

## 2023-08-24 DIAGNOSIS — M1611 Unilateral primary osteoarthritis, right hip: Secondary | ICD-10-CM | POA: Diagnosis not present

## 2023-08-24 DIAGNOSIS — N393 Stress incontinence (female) (male): Secondary | ICD-10-CM | POA: Diagnosis not present

## 2023-08-24 MED ORDER — IBUPROFEN 800 MG PO TABS: 800.0000 mg | ORAL_TABLET | Freq: Three times a day (TID) | ORAL | 2 refills | Status: DC | PRN

## 2023-08-24 NOTE — Assessment & Plan Note (Signed)
 Right hip arthritis noted on x-ray, right groin pain for years, doing much better with physical therapy. She is continuing to improve so we will stay the course for now. We will switch from meloxicam  to ibuprofen due to lack of efficacy with meloxicam . If she plateaus we can consider MR arthrography as I did have some concern for labral tearing due to a positive FADIR sign. For now follow-up can be as needed.

## 2023-08-24 NOTE — Progress Notes (Signed)
    Procedures performed today:    None.  Independent interpretation of notes and tests performed by another provider:   None.  Brief History, Exam, Impression, and Recommendations:    Primary osteoarthritis of right hip Right hip arthritis noted on x-ray, right groin pain for years, doing much better with physical therapy. She is continuing to improve so we will stay the course for now. We will switch from meloxicam  to ibuprofen due to lack of efficacy with meloxicam . If she plateaus we can consider MR arthrography as I did have some concern for labral tearing due to a positive FADIR sign. For now follow-up can be as needed.  Stress incontinence Patient is requesting referral to pelvic floor therapy, I have placed this referral to [facility] urology. Further correspondence need to be with PCP.  Chronic process not at goal with pharmacologic intervention  ____________________________________________ Joselyn Nicely. Sandy Crumb, M.D., ABFM., CAQSM., AME. Primary Care and Sports Medicine [facility] [address] [facility]  Adjunct Professor of [facility] Medicine  [employer]  Restaurant manager, fast food

## 2023-08-24 NOTE — Assessment & Plan Note (Signed)
 Patient is requesting referral to pelvic floor therapy, I have placed this referral to [facility] urology. Further correspondence need to be with PCP.

## 2023-08-26 ENCOUNTER — Other Ambulatory Visit: Payer: Self-pay | Admitting: Family Medicine

## 2023-08-27 ENCOUNTER — Ambulatory Visit (HOSPITAL_BASED_OUTPATIENT_CLINIC_OR_DEPARTMENT_OTHER): Payer: Self-pay | Admitting: Physical Therapy

## 2023-08-27 ENCOUNTER — Ambulatory Visit

## 2023-08-27 ENCOUNTER — Encounter (HOSPITAL_BASED_OUTPATIENT_CLINIC_OR_DEPARTMENT_OTHER): Payer: Self-pay | Admitting: Physical Therapy

## 2023-08-27 DIAGNOSIS — M25551 Pain in right hip: Secondary | ICD-10-CM

## 2023-08-27 DIAGNOSIS — M6281 Muscle weakness (generalized): Secondary | ICD-10-CM

## 2023-08-27 DIAGNOSIS — R29898 Other symptoms and signs involving the musculoskeletal system: Secondary | ICD-10-CM

## 2023-08-27 NOTE — Therapy (Signed)
 OUTPATIENT PHYSICAL THERAPY LOWER EXTREMITY TREATMENT   Patient Name: Kaitlyn Kirby MRN: [id number] DOB:May 23, 1970, 53 y.o., female Today's Date: 08/27/2023  END OF SESSION:  PT End of Session - 08/27/23 1110     Visit Number 6    Number of Visits 24    Date for PT Re-Evaluation 10/04/23    Authorization Type state health plan [employer] $1500 deduct coins 70/30; copay - none - no auth    PT Start Time 1100    PT Stop Time 1139    PT Time Calculation (min) 39 min    Activity Tolerance Patient tolerated treatment well    Behavior During Therapy WFL for tasks assessed/performed             Past Medical History:  Diagnosis Date   Abnormal Pap smear 1995   Colpo   Allergy    Asthma    seasonal   Basal cell carcinoma 2010   Depression    DM type 2 (diabetes mellitus, type 2) (HCC)    Hemorrhoids    IBS (irritable bowel syndrome) 8-10 years ago   PONV (postoperative nausea and vomiting)    Rectal fissure    Past Surgical History:  Procedure Laterality Date   EYE SURGERY Right 2017   clogged tear duct   HEMORRHOID SURGERY  06/22/2019   MINOR SPHINCTEROTOMY N/A 02/07/2020   Procedure: ANAL SPHINCTEROTOMY.HEMORRHOIDECTOMY.ANORECTAL EXAMINATION UNDER ANESTHESIA, EXCISION OF ANAL POLYPS;  Surgeon: Candyce Champagne, MD;  Location: [facility];  Service: General;  Laterality: N/A;   SKIN CANCER EXCISION  2010   chest, basal cell   WISDOM TOOTH EXTRACTION  53 years old   Patient Active Problem List   Diagnosis Date Noted   Primary osteoarthritis of right hip 07/07/2023   Chronic pain of left thumb 07/07/2023   Perimenopausal symptoms 11/04/2022   Stress incontinence 03/10/2022   Jaundice 01/06/2022   Dyspnea on exertion 01/06/2022   B12 deficiency 10/15/2021   Vitamin D  deficiency 10/15/2021   Encounter for weight management 04/15/2021   OSA (obstructive sleep apnea) 01/23/2021   Hyperlipidemia 08/02/2019   Controlled type 2 diabetes mellitus (HCC) 11/16/2018    Secondary oligomenorrhea 11/11/2018   Dyspareunia in female 02/04/2018   Internal hemorrhoid, bleeding 12/23/2017   Severe obesity (BMI >= 40) (HCC) 09/14/2013   Lichen simplex chronicus 02/03/2012   Fissure in ano 01/02/2011   Allergic rhinitis due to allergen 12/28/2008   LUMBAGO 04/05/2006   Depression 02/04/2006   Hemorrhoids, external 02/04/2006   Irritable bowel syndrome with constipation 02/04/2006    PCP: Dr Duaine German  REFERRING PROVIDER: Dr Annemarie Kil  REFERRING DIAG: OA R hip   THERAPY DIAG:  Pain in right hip  Other symptoms and signs involving the musculoskeletal system  Muscle weakness (generalized)  Rationale for Evaluation and Treatment: Rehabilitation  ONSET DATE: 05/01/23  SUBJECTIVE:   SUBJECTIVE STATEMENT: Pt reports she was sore "in a good way" after aquatic therapy session. She reports improvement in R hip symptoms since last visit. R hip continues to tire with walking long distances (ie: [employer]).       EVAL: Patient reports that she has had R hip pain for the past 10 years on an intermittent basis pain is constant in the past two months. She played sports and feels that she overused the R hip. She played softball - third base - and feels the throwing and batting, running during the time she played sports. Pain is constant with variable intensity  PERTINENT HISTORY: AODM; denies any other medical or musculoskeletal problems   PAIN:  Are you having pain?  Yes: NPRS scale: 4/10; worst in the past week 7-8/10 (walking at [employer] and driving 12 hours)  Pain location: R hip - deep sometimes radiating toward pelvic of toward LB Pain description: nagging, tightness Aggravating factors: sitting > 1 hour; walking > 10 min  Relieving factors: rest; changing positions; medication; heat; ice   PRECAUTIONS: None  WEIGHT BEARING RESTRICTIONS: No  FALLS:  Has patient fallen in last 6 months? 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  DIAGNOSTIC FINDINGS: Xray R hip 06/07/23: The hip joint spaces preserved. There is mild acetabular spurring. No evidence of fracture, erosion, or avascular necrosis. Slight degenerative change of the sacroiliac joints. Pubic symphysis is congruent. No evidence of focal bone lesion or bone destruction. Multiple phleboliths in the pelvis.   IMPRESSION: Mild degenerative acetabular spurring of the right hip.  PATIENT SURVEYS:  LEFS 35/80; 43.8%    SENSATION: WFL  EDEMA:  Some edema in R anterior hip on an intermittent basis   MUSCLE LENGTH: Hamstrings: Right 60 deg; Left 65 deg Thomas test: Right -10-15 deg; Left -5-10 deg  POSTURE: knees hyperextended; increased lumbar lordosis   PALPATION: Muscular tightness to palpation through the R anterior hip; hip flexors; groin; posterior hip - piriformis, glut med, ITB to lesser extent glut max  LOWER EXTREMITY ROM:  Active ROM Right eval Left eval  Hip flexion Tight 90 deg Tight 100 deg   Hip extension (-)10-15 (-)5-10  Hip abduction Tight  Tight   Hip adduction    Hip internal rotation ~ 25 deg sitting  ~ 35 deg sitting   Hip external rotation ~20 deg sitting  ~ 20 deg sitting   Knee flexion WFLs WFLs  Knee extension Hyperextension  Hyperextension   Ankle dorsiflexion    Ankle plantarflexion    Ankle inversion    Ankle eversion     (Blank rows = not tested)  LOWER EXTREMITY MMT:  MMT Right eval Right 08/11/23  Left eval Left  08/11/23  Hip flexion 4- 5- 5- 5  Hip extension 4 4+ 4 5  Hip abduction 4- 4 4 5   Hip adduction      Hip internal rotation      Hip external rotation      Knee flexion 5  5   Knee extension 5  5   Ankle dorsiflexion      Ankle plantarflexion      Ankle inversion      Ankle eversion       (Blank rows = not tested)  LOWER EXTREMITY SPECIAL TESTS:  Hip special tests: FADIR test positive; Portia Brittle (FABER) test: negative, Trendelenburg test: negative, Thomas test: positive for hip flexor tightness , SI compression test: negative, SI distraction test: negative, and Piriformis test: positive for muscular tightness   FUNCTIONAL TESTS:  5 times sit to stand: 12.34 sec weight  shifted to L    GAIT: Distance walked: 40 ft Assistive device utilized: None Level of assistance: Complete Independence Comments: LE's in ER no asymmetry noted   07/14/23: assessment of gait pattern on treadmill shows positive Trendelenburg on R (with wt bearing on R she has drop of L hemipelvis)   [facility] Adult PT Treatment:                                             08/27/23 Pt seen for aquatic therapy today.  Treatment took place in water 3.5-4.75 ft in depth at the [facility] pool. Temp of water was 91.  Pt entered/exited the pool via stairs independently with bilat rail.  - unsupported walking forward/ backward, multiple laps, cues for core engagement and vertical trunk - side stepping  with arm addct/ abdct with yellow hand floats -> into wide squat - UE on yellow hand floats:  hip add/abd 2 x 10 (trial of crossing midline) ; hip flexion /extension 2x 10 each LE - TrA set with yellow hand floats pull down to thighs -> to same side and opp side knee taps while marching forward backward - straddling noodle with no UE support: cycling  - 3 way LE stretch with ankle supported by hollow noodle - quad/hip flexor stretch with ankle supported by noodle    [facility] Adult PT Treatment:                                              08/20/23 Pt seen for aquatic therapy today.  Treatment took place in water 3.5-4.75 ft in depth at the [facility] pool. Temp of water was 91.  Pt entered/exited the pool via stairs independently with bilat rail.  - Intro to aquatic therapy principles - unsupported walking forward/ backward, multiple laps, cues for core engagement and vertical trunk - side stepping -> with arm addct/ abdct with rainbow-> yellow hand floats -> into wide squat - UE on wall:  hip add/abd 2 x 10 (trial of crossing midline) ; hip flexion /extension x 10 each LE, cues to monitor height of LE; straight LE hip circles with toes in contact with ground  - TrA set with hollow long noodle-->solid noodle-> kick board-> rainbow hand floats-> yellow hand floats pull down to thighs x 5 each - straddling noodle with no UE support: cycling  - 3 way LE stretch with ankle supported by hollow noodle - quad/hip flexor stretch with ankle supported by noodle    Pt requires the buoyancy and hydrostatic pressure of water for support, and to offload joints by unweighting joint load by at least 50 % in navel deep water and by at least 75-80% in chest to neck deep water.  Viscosity of the water is needed for resistance of strengthening. Water current perturbations provides challenge to standing balance requiring increased core activation.    [facility] Adult PT Treatment:                                                DATE: 08/19/23 Therapeutic Exercise:  Supine  Piriformis stretch w/ strap 30 sec x  3 Hamstring stretch with strap 30 sec x 3 ITB/lateral HS stretch with strap 30 sec x 3 Sitting Piriformis stretch 30 sec x 3  Sidelying  ITB stretch R LE hanging off table 30 sec x 3  Standing  Therapeutic Activity:  Standing  Trunk flexion rolling over green therapeutic ball on lowered table to stretch lumbar spine 20 sec x 3  Side steps with blue TB crossed monster walk R/L x 5 ft x 6  Supine  4  part core (difficulty with pelvic floor contraction)  Hip abduction alternating LE's blue TB x 10 x 2   Bridging with blue TB distal thighs 3 sec x 10  Sitting  Trunk flexion in chair Anterior posterior pelvic tilt in sitting  Quadruped  Cat camel x 10 (difficulty WB through UE's) Child's pose 20 sec x 3 (difficulty WB through UE's) Manual:   Deep tissue release work through the R ITB and lateral hip patient in L sidelying in ITB stretch position   Deep tissue work through posterior R hip with PROM hip IR/ER stretch hip extended, knee flexed   Self Care: Myofacial ball release work standing    [facility] Adult PT Treatment:                                                DATE: 08/11/23 Therapeutic Exercise:  Supine  Piriformis stretch w/ strap 30 sec x 3 Hamstring stretch with strap 30 sec x 3 ITB/lateral HS stretch with strap 30 sec x 3 Sitting Piriformis stretch 30 sec x 3  Sidelying  ITB stretch R LE hanging off table 30 sec x 3  Standing  Therapeutic Activity: Sidelying Hip abduction leading with heel 5 sec x 10 x 2  Clamshell 3 sec x 10 x 2 L  Reverse clamshell 3 sec x 10 x 2 L Standing  Hip abduction blue TB 3 sec x 10  Hip extension standing 3 sec x 10  Side step blue TB (difficulty keeping TB in place - rolling down) Supine  Hip abduction alternating LE's blue TB x 10 x 2   4 part core  Sitting  Anterior posterior pelvic tilt in sitting   Manual:   Deep tissue release work through the R ITB and lateral hip patient in L sidelying in ITB stretch position   Deep tissue work through posterior R hip with PROM hip IR/ER stretch hip extended, knee flexed   Self Care: Myofacial ball release work standing    [facility] Adult PT Treatment:                                                DATE: 07/14/23 Therapeutic Exercise: Treadmill 1.7 mph x 5 min for warm up and evaluation of gait  Supine  Piriformis stretch w/ strap 30 sec x 3 Hamstring stretch with strap 30 sec x  3 ITB/lateral HS stretch with strap 30 sec x 3 Sitting Piriformis stretch 30 sec x 3  Sidelying  Standing  Therapeutic Activity: Sidelying Hip abduction leading with heel 5 sec x 10 x 2  Standing  Hip abduction blue TB 3 sec x 10  Trial of standing isometric hip abduction at wall increased R knee pain Supine  Hip abduction alternating LE's blue TB x 10 x 2   Self Care: Myofacial ball release work standing                                                                                                                                TREATMENT DATE:  See HEP for exercises Education and discussion of eval findings and POC     PATIENT EDUCATION:  Education details:aquatic therapy intro  Person educated: Patient Education method: Explanation, Demonstration, Tactile cues, Verbal cues,  Education comprehension: verbalized understanding, returned demonstration, verbal cues required, tactile cues required, and needs further education  HOME EXERCISE PROGRAM: Access Code: [email or web address] URL: [email or web address] Date: 08/19/2023 Prepared by: Celyn Holt  Exercises - Hip Flexor Stretch at [facility] of Bed  - 2 x daily - 7 x weekly - 1 sets - 3 reps - 30 sec  hold - Seated Hip Flexor Stretch  - 2 x daily - 7 x weekly - 1 sets - 3 reps - 30 sec  hold - Supine Piriformis Stretch with Leg Straight  - 2 x daily - 7 x weekly - 1 sets - 3 reps - 30 sec  hold - Supine ITB Stretch with Strap  - 2 x daily - 7 x weekly - 1 sets - 3 reps - 30 sec  hold - Sidelying Hip Abduction  - 1 x daily - 7 x weekly - 3 sets - 10 reps - 3-5 sec  hold - Hooklying Isometric Clamshell  - 1 x daily - 7 x weekly - 1 sets - 10 reps - 3 sec  hold - Standing Hip Abduction with Resistance at Ankles and Counter Support  - 1 x daily - 7 x weekly - 1-2 sets - 10 reps - 3-5 sec  hold - Standing Piriformis Release with Ball at [employer]  - 2 x daily - 7 x weekly - 30-60 sec  hold - Clamshell  - 1 x daily - 7 x weekly - 1-2  sets - 10 reps - 3 sec  hold - Sidelying Reverse Clamshell  - 1 x daily - 7 x weekly - 1-2 sets - 10 reps - 3 sec  hold - Standing Hip Extension with Counter Support  - 2 x daily - 7 x weekly - 1-2 sets - 10 reps - 3-5 sec  hold - Standing Hip Abduction with Counter Support  - 1 x daily - 7 x weekly - 2-3 sets - 10 reps - 2-3 sec  hold - Side Stepping with Resistance at Thighs  - 1 x daily - 7 x weekly - Sidelying ITB Stretch off Table  - 1 x daily - 7 x weekly - 1 sets - 3 reps - 30 sec  hold - Supine Bridge with Resistance Band  - 1 x daily - 7 x weekly - 1-2 sets - 10 reps - 5-10 sec  hold - Supine Transversus Abdominis Bracing with  Pelvic Floor Contraction  - 2 x daily - 7 x weekly - 1 sets - 10 reps - 10sec  hold - Prone Flexion Stretch on Swiss Ball  - 2 x daily - 7 x weekly - 1 sets - 3 reps - 30 sec  hold - Seated Flexion Stretch  - 2 x daily - 7 x weekly - 1 sets - 3 reps - 30 sec  hold - Side Stepping with Resistance at Ankles and Counter Support  - 1 x daily - 7 x weekly - 1 sets -  sec  hold  ASSESSMENT:  CLINICAL IMPRESSION: Pt with good tolerance for aquatic exercise today; no production of symptoms during session. Will continue to explore exercises in pool and create aquatic HEP.   Goals are ongoing.     EVAL: Patient is a 53 y.o. female who was seen today for physical therapy evaluation and treatment for R hip OA. She reports intermittent symptoms for at least the past 10 years but increased pain which is now constant in the past 2 months. She has limited ROM and mobility; decreased strength; pain with palpation R hip in hip flexors, abductors, extensors. She has pain with prolonged sitting, standing, walking. Diagnostic tests show mild degenerative acetabular spurring of the right hip.  Patient will benefit from PT to address problems identified.   OBJECTIVE IMPAIRMENTS: decreased activity tolerance, decreased balance, decreased endurance, decreased mobility, difficulty walking,  decreased ROM, decreased strength, increased fascial restrictions, increased muscle spasms, impaired flexibility, postural dysfunction, obesity, and pain.    GOALS: Goals reviewed with patient? Yes  SHORT TERM GOALS: Target date: 08/23/2023   Independent in initial HEP  Baseline: Goal status: met  2.  Increase mobility R hip with hip extension to neutral with posterior pelvic tilt in supine  Baseline:  Goal status: met  3.  Increase R hip strength by 1/2 muscle grade in hip flexion abduction, extension Baseline:  Goal status: met   LONG TERM GOALS: Target date: 10/04/2023    Decrease R hip pain to 0-2/10 for functional activities including sitting or standing > one hour Baseline:  Goal status: INITIAL  2.  Increase strength R hip to 4+/5 to 5/5  Baseline:  Goal status: INITIAL  3.  Increase sitting time to 60-80 min with minimal to no increase in hip pain Baseline:  Goal status: INITIAL  4.  Increase standing and walking tolerance to 60-80 minutes  Baseline:  Goal status: INITIAL  5.  Independent in HEP including aquatic program as indicated  Baseline:  Goal status: INITIAL  6.  Improve LEFS by 20 points  Baseline: 35/80; 43.8%  Goal status: INITIAL   PLAN:  PT FREQUENCY: 2x/week  PT DURATION: 8 weeks  PLANNED INTERVENTIONS: 97110-Therapeutic exercises, 97530- Therapeutic activity, 97112- Neuromuscular re-education, 97535- Self Care, 16109- Manual therapy, [phone]- Gait training, [phone]- Aquatic Therapy, [phone]- Ionotophoresis 4mg /ml Dexamethasone , Patient/Family education, Balance training, Stair training, Taping, Dry Needling, and Joint mobilization  PLAN FOR NEXT SESSION: review and progress exercises; education; manual work and modalities as indicated -strengthening glut med   Almedia Jacobsen, PTA 08/27/23 3:43 PM [facility] [address] [place], [place], [address] Phone: [phone]   Fax:   [phone]

## 2023-08-31 ENCOUNTER — Ambulatory Visit: Attending: Sports Medicine

## 2023-08-31 DIAGNOSIS — M6281 Muscle weakness (generalized): Secondary | ICD-10-CM | POA: Insufficient documentation

## 2023-08-31 DIAGNOSIS — M25551 Pain in right hip: Secondary | ICD-10-CM | POA: Insufficient documentation

## 2023-08-31 DIAGNOSIS — R29898 Other symptoms and signs involving the musculoskeletal system: Secondary | ICD-10-CM | POA: Insufficient documentation

## 2023-08-31 NOTE — Therapy (Signed)
 OUTPATIENT PHYSICAL THERAPY LOWER EXTREMITY TREATMENT   Patient Name: Kaitlyn Kirby MRN: [id number] DOB:02-Oct-1970, 53 y.o., female Today's Date: 08/31/2023  END OF SESSION:  PT End of Session - 08/31/23 1020     Visit Number 7    Number of Visits 24    Date for PT Re-Evaluation 10/04/23    Authorization Type state health plan [employer] $1500 deduct coins 70/30; copay - none - no auth    PT Start Time 1020    PT Stop Time 1104    PT Time Calculation (min) 44 min    Activity Tolerance Patient tolerated treatment well    Behavior During Therapy WFL for tasks assessed/performed            Past Medical History:  Diagnosis Date   Abnormal Pap smear 1995   [place]   Allergy    Asthma    seasonal   Basal cell carcinoma 2010   Depression    DM type 2 (diabetes mellitus, type 2) (HCC)    Hemorrhoids    IBS (irritable bowel syndrome) 8-10 years ago   PONV (postoperative nausea and vomiting)    Rectal fissure    Past Surgical History:  Procedure Laterality Date   EYE SURGERY Right 2017   clogged tear duct   HEMORRHOID SURGERY  06/22/2019   MINOR SPHINCTEROTOMY N/A 02/07/2020   Procedure: ANAL SPHINCTEROTOMY.HEMORRHOIDECTOMY.ANORECTAL EXAMINATION UNDER ANESTHESIA, EXCISION OF ANAL POLYPS;  Surgeon: Candyce Champagne, MD;  Location: [facility] [place];  Service: General;  Laterality: N/A;   SKIN CANCER EXCISION  2010   chest, basal cell   WISDOM TOOTH EXTRACTION  53 years old   Patient Active Problem List   Diagnosis Date Noted   Primary osteoarthritis of right hip 07/07/2023   Chronic pain of left thumb 07/07/2023   Perimenopausal symptoms 11/04/2022   Stress incontinence 03/10/2022   Jaundice 01/06/2022   Dyspnea on exertion 01/06/2022   B12 deficiency 10/15/2021   Vitamin D  deficiency 10/15/2021   Encounter for weight management 04/15/2021   OSA (obstructive sleep apnea) 01/23/2021   Hyperlipidemia 08/02/2019   Controlled type 2 diabetes mellitus (HCC) 11/16/2018    Secondary oligomenorrhea 11/11/2018   Dyspareunia in female 02/04/2018   Internal hemorrhoid, bleeding 12/23/2017   Severe obesity (BMI >= 40) (HCC) 09/14/2013   Lichen simplex chronicus 02/03/2012   Fissure in ano 01/02/2011   Allergic rhinitis due to allergen 12/28/2008   LUMBAGO 04/05/2006   Depression 02/04/2006   Hemorrhoids, external 02/04/2006   Irritable bowel syndrome with constipation 02/04/2006    PCP: Dr Duaine German  REFERRING PROVIDER: Dr Annemarie Kil  REFERRING DIAG: OA R hip   THERAPY DIAG:  Pain in right hip  Other symptoms and signs involving the musculoskeletal system  Muscle weakness (generalized)  Rationale for Evaluation and Treatment: Rehabilitation  ONSET DATE: 05/01/23  SUBJECTIVE:   SUBJECTIVE STATEMENT: Patient reports she was sick last week with a stomach bug which is why she missed PT; states her energy levels are low as a result. Patient states her hip is feeling stronger and less pain with aquatic therapy, "overall it feels more stable".   PERTINENT HISTORY: AODM; denies any other medical or musculoskeletal problems   PAIN:  Are you having pain?  Yes: NPRS scale: 0/10 currently; worst 2/10 Pain location: R hip - deep sometimes radiating toward pelvic of toward LB Pain description: nagging, tightness Aggravating factors: sitting > 1 hour; walking > 10 min  Relieving factors: rest; changing positions; medication;  heat; ice   PRECAUTIONS: None  WEIGHT BEARING RESTRICTIONS: No  FALLS:  Has patient fallen in last 6 months? 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  DIAGNOSTIC FINDINGS: Xray R hip 06/07/23: The hip joint spaces preserved. There is mild acetabular spurring. No evidence of fracture, erosion, or avascular necrosis. Slight degenerative change of the sacroiliac joints. Pubic symphysis is congruent. No evidence of focal bone lesion or bone destruction. Multiple phleboliths in the pelvis.   IMPRESSION: Mild degenerative acetabular spurring of the right hip.  PATIENT SURVEYS:  LEFS 35/80; 43.8%    SENSATION: WFL  EDEMA:  Some edema in R anterior hip on an intermittent basis   MUSCLE LENGTH: Hamstrings: Right 60 deg; Left 65 deg Thomas test: Right -10-15 deg; Left -5-10 deg  POSTURE: knees hyperextended; increased lumbar lordosis   PALPATION: Muscular tightness to palpation through the R anterior hip; hip flexors; groin; posterior hip - piriformis, glut med, ITB to lesser extent glut max  LOWER EXTREMITY ROM:  Active ROM Right eval Left eval  Hip flexion Tight 90 deg Tight 100 deg   Hip extension (-)10-15 (-)5-10  Hip abduction Tight  Tight   Hip adduction    Hip internal rotation ~ 25 deg sitting  ~ 35 deg sitting   Hip external rotation ~20 deg sitting  ~ 20 deg sitting   Knee flexion WFLs WFLs  Knee extension Hyperextension  Hyperextension   Ankle dorsiflexion    Ankle plantarflexion    Ankle inversion    Ankle eversion     (Blank rows = not tested)  LOWER EXTREMITY MMT:  MMT Right eval Right 08/11/23 Left eval Left  08/11/23 Right 08/31/23 Left 08/31/23  Hip flexion 4- 5- 5- 5 4+ 5  Hip extension 4 4+ 4 5 4+ 5  Hip abduction 4- 4 4 5 5 5   Hip adduction        Hip internal rotation        Hip external rotation        Knee flexion 5  5     Knee extension 5  5     Ankle dorsiflexion        Ankle plantarflexion        Ankle inversion        Ankle eversion          (Blank rows = not tested)  LOWER EXTREMITY SPECIAL TESTS:  Hip special tests: FADIR test positive; Portia Brittle (FABER) test: negative, Trendelenburg test: negative, Thomas test: positive for hip flexor tightness , SI compression test: negative, SI distraction test: negative, and Piriformis test: positive for muscular tightness   FUNCTIONAL TESTS:  5 times sit to stand: 12.34 sec weight shifted to L    GAIT: Distance walked: 40 ft Assistive device utilized: None Level of assistance: Complete Independence Comments: LE's in ER no asymmetry noted   07/14/23: assessment of gait pattern on treadmill shows positive Trendelenburg on R (with wt bearing  on R she has drop of L hemipelvis)    [facility] Adult PT Treatment:                                                DATE: 08/31/2023 Therapeutic Exercise: Hip MMT Resisted side stepping + RTB crossed at ankles Resisted diagonal stepping fwd/bkwd + RTB crossed at ankles Marching + RTB crossed at forefoot Neuromuscular re-ed: Dead bug progression: Isometric hold + orange PB brace Modified dead bug + orange PB brace Dead bug + orange PB Therapeutic Activity: LEFS    [facility] Adult PT Treatment:                                             08/27/23 Pt seen for aquatic therapy today.  Treatment took place in water 3.5-4.75 ft in depth at the [facility] pool. Temp of water was 91.  Pt entered/exited the pool via stairs independently with bilat rail.  - unsupported walking forward/ backward, multiple laps, cues for core engagement and vertical trunk - side stepping  with arm addct/ abdct with yellow hand floats -> into wide squat - UE on yellow hand floats:  hip add/abd 2 x 10 (trial of crossing midline) ; hip flexion /extension 2x 10 each LE - TrA set with yellow hand floats pull down to thighs -> to same side and opp side knee taps while marching forward backward - straddling noodle with no UE support: cycling  - 3 way LE stretch with ankle  supported by hollow noodle - quad/hip flexor stretch with ankle supported by noodle    [facility] Adult PT Treatment:                                             08/20/23 Pt seen for aquatic therapy today.  Treatment took place in water 3.5-4.75 ft in depth at the [facility] pool. Temp of water was 91.  Pt entered/exited the pool via stairs independently with bilat rail.  - Intro to aquatic therapy principles - unsupported walking forward/ backward, multiple laps, cues for core engagement and vertical trunk - side stepping -> with arm addct/ abdct with rainbow-> yellow hand floats -> into wide squat - UE on wall:  hip add/abd 2 x 10 (trial of crossing midline) ; hip flexion /extension x 10 each LE, cues to monitor height of LE; straight LE hip circles with toes in contact with ground  - TrA set with hollow long noodle-->solid noodle-> kick board-> rainbow hand floats-> yellow hand floats pull down to thighs x 5 each - straddling noodle with no UE support: cycling  - 3 way LE stretch with ankle supported by hollow noodle - quad/hip flexor stretch with ankle supported by noodle    Pt requires the buoyancy and hydrostatic pressure of water for support, and to offload joints by unweighting joint load by at least 50 % in navel deep water and by at least 75-80% in chest to neck deep water.  Viscosity of the water is needed for resistance of strengthening. Water current perturbations provides challenge to standing balance requiring increased core activation.  [facility] Adult PT Treatment:                                                DATE: 08/19/23 Therapeutic Exercise:  Supine  Piriformis stretch w/ strap 30 sec x 3 Hamstring stretch with strap 30 sec x 3 ITB/lateral HS stretch with strap 30 sec x 3 Sitting Piriformis stretch 30 sec x 3  Sidelying  ITB stretch R LE hanging off table 30 sec x 3  Standing   Therapeutic Activity: Standing  Trunk flexion rolling over green therapeutic  ball on lowered table to stretch lumbar spine 20 sec x 3  Side steps with blue TB crossed monster walk R/L x 5 ft x 6  Supine  4 part core (difficulty with pelvic floor contraction)  Hip abduction alternating LE's blue TB x 10 x 2   Bridging with blue TB distal thighs 3 sec x 10  Sitting  Trunk flexion in chair Anterior posterior pelvic tilt in sitting  Quadruped  Cat camel x 10 (difficulty WB through UE's) Child's pose 20 sec x 3 (difficulty WB through UE's) Manual:   Deep tissue release work through the R ITB and lateral hip patient in L sidelying in ITB stretch position   Deep tissue work through posterior R hip with PROM hip IR/ER stretch hip extended, knee flexed   Self Care: Myofacial ball release work standing     PATIENT EDUCATION:  Education details:aquatic therapy intro  Person educated: Patient Education method: Programmer, multimedia, Facilities manager, Actor cues, Verbal cues,  Education comprehension: verbalized understanding, returned demonstration, verbal cues required, tactile cues required, and needs further education  HOME EXERCISE PROGRAM: Access Code: [email or web address] URL: [email or web address] Date: 08/19/2023 Prepared by: Celyn Holt  Exercises - Hip Flexor Stretch at [facility] of Bed  - 2 x daily - 7 x weekly - 1 sets - 3 reps - 30 sec  hold - Seated Hip Flexor Stretch  - 2 x daily - 7 x weekly - 1 sets - 3 reps - 30 sec  hold - Supine Piriformis Stretch with Leg Straight  - 2 x daily - 7 x weekly - 1 sets - 3 reps - 30 sec  hold - Supine ITB Stretch with Strap  - 2 x daily - 7 x weekly - 1 sets - 3 reps - 30 sec  hold - Sidelying Hip Abduction  - 1 x daily - 7 x weekly - 3 sets - 10 reps - 3-5 sec  hold - Hooklying Isometric Clamshell  - 1 x daily - 7 x weekly - 1 sets - 10 reps - 3 sec  hold - Standing Hip Abduction with Resistance at Ankles and Counter Support  - 1 x daily - 7 x weekly - 1-2 sets - 10 reps - 3-5 sec  hold - Standing Piriformis Release with Ball  at [employer]  - 2 x daily - 7 x weekly - 30-60 sec  hold - Clamshell  - 1 x daily - 7 x weekly - 1-2 sets - 10 reps - 3 sec  hold - Sidelying Reverse Clamshell  - 1 x daily - 7 x weekly - 1-2 sets - 10 reps - 3 sec  hold - Standing Hip Extension with Counter Support  - 2 x daily - 7 x weekly - 1-2 sets - 10 reps - 3-5 sec  hold - Standing Hip Abduction with Counter Support  - 1 x daily - 7 x weekly - 2-3 sets - 10 reps - 2-3 sec  hold - Side Stepping with Resistance at Thighs  - 1 x daily - 7 x weekly - Sidelying ITB Stretch off Table  - 1 x daily - 7 x weekly - 1 sets - 3 reps - 30 sec  hold - Supine Bridge with Resistance Band  - 1 x daily - 7 x weekly - 1-2 sets - 10 reps - 5-10 sec  hold - Supine Transversus Abdominis Bracing with Pelvic Floor Contraction  - 2 x daily - 7 x weekly - 1 sets - 10 reps - 10sec  hold - Prone Flexion Stretch on Swiss Ball  - 2 x daily - 7 x weekly - 1 sets - 3 reps - 30 sec  hold - Seated Flexion Stretch  - 2 x daily - 7 x weekly - 1 sets - 3 reps - 30 sec  hold - Side Stepping with Resistance at Ankles and Counter Support  - 1 x daily - 7 x weekly - 1 sets -  sec  hold  ASSESSMENT:  CLINICAL IMPRESSION:  Hip strengthening progressed with resisted multi-directional walking. Hip flexor strengthening challenged with resisted marching. Core stabilization challenged with dead bug progression; patient fatigued quickly due to low energy levels from previous illness.    OBJECTIVE IMPAIRMENTS: decreased activity tolerance, decreased balance, decreased endurance, decreased mobility, difficulty walking, decreased ROM, decreased strength, increased fascial restrictions, increased muscle spasms, impaired flexibility, postural dysfunction, obesity, and pain.    GOALS: Goals reviewed with patient? Yes  SHORT TERM GOALS: Target date: 08/23/2023  Independent in initial HEP  Baseline: Goal status: met  2.  Increase mobility R hip with hip extension to neutral with posterior  pelvic tilt in supine  Baseline:  Goal status: met  3.  Increase R hip strength by 1/2 muscle grade in hip flexion abduction, extension Baseline:  Goal status: met   LONG TERM GOALS: Target date: 10/04/2023  Decrease R hip pain to 0-2/10 for functional activities including sitting or standing > one hour Baseline:  Goal status: MET  2.  Increase strength R hip to 4+/5 to 5/5  Baseline:  08/31/23: see above Goal status: MET  3.  Increase sitting time to 60-80 min with minimal to no increase in hip pain Baseline:  08/31/23: no pain with seat cushion/back support; mod pain with hard seat/no back support Goal status: IN PROGRESS  4.  Increase standing and walking tolerance to 60-80 minutes  Baseline:  Goal status: INITIAL  5.  Independent in HEP including aquatic program as indicated  Baseline:  Goal status: INITIAL  6.  Improve LEFS by 20 points  Baseline: 35/80; 43.8%  08/31/23: 55/80 = 68.8% Goal status: MET   PLAN:  PT FREQUENCY: 2x/week  PT DURATION: 8 weeks  PLANNED INTERVENTIONS: 97110-Therapeutic exercises, 97530- Therapeutic activity, 97112- Neuromuscular re-education, 97535- Self Care, 29562- Manual therapy, [phone]- Gait training, [phone]- Aquatic Therapy, [phone]- Ionotophoresis 4mg /ml Dexamethasone , Patient/Family education, Balance training, Stair training, Taping, Dry Needling, and Joint mobilization  PLAN FOR NEXT SESSION: review and progress exercises; education; manual work and modalities as indicated -strengthening glut med   Sims Duck, PTA 08/31/2023 11:07 AM

## 2023-09-02 ENCOUNTER — Ambulatory Visit (HOSPITAL_BASED_OUTPATIENT_CLINIC_OR_DEPARTMENT_OTHER): Attending: Sports Medicine | Admitting: Physical Therapy

## 2023-09-02 ENCOUNTER — Encounter (HOSPITAL_BASED_OUTPATIENT_CLINIC_OR_DEPARTMENT_OTHER): Payer: Self-pay | Admitting: Physical Therapy

## 2023-09-02 DIAGNOSIS — M25551 Pain in right hip: Secondary | ICD-10-CM | POA: Diagnosis present

## 2023-09-02 DIAGNOSIS — M6281 Muscle weakness (generalized): Secondary | ICD-10-CM | POA: Diagnosis present

## 2023-09-02 DIAGNOSIS — R29898 Other symptoms and signs involving the musculoskeletal system: Secondary | ICD-10-CM | POA: Diagnosis present

## 2023-09-02 NOTE — Therapy (Addendum)
 OUTPATIENT PHYSICAL THERAPY LOWER EXTREMITY TREATMENT   Patient Name: Kaitlyn Kirby MRN: [id number] DOB:08-07-1970, 53 y.o., female Today's Date: 09/02/2023  END OF SESSION:  PT End of Session - 09/02/23 0820     Visit Number 8    Number of Visits 24    Date for PT Re-Evaluation 10/04/23    Authorization Type state health plan [employer] $1500 deduct coins 70/30; copay - none - no auth    PT Start Time 0802    PT Stop Time 0842    PT Time Calculation (min) 40 min    Behavior During Therapy [facility] for tasks assessed/performed            Past Medical History:  Diagnosis Date   Abnormal Pap smear 1995   Colpo   Allergy    Asthma    seasonal   Basal cell carcinoma 2010   Depression    DM type 2 (diabetes mellitus, type 2) (HCC)    Hemorrhoids    IBS (irritable bowel syndrome) 8-10 years ago   PONV (postoperative nausea and vomiting)    Rectal fissure    Past Surgical History:  Procedure Laterality Date   EYE SURGERY Right 2017   clogged tear duct   HEMORRHOID SURGERY  06/22/2019   MINOR SPHINCTEROTOMY N/A 02/07/2020   Procedure: ANAL SPHINCTEROTOMY.HEMORRHOIDECTOMY.ANORECTAL EXAMINATION UNDER ANESTHESIA, EXCISION OF ANAL POLYPS;  Surgeon: Sheldon Standing, MD;  Location: [facility] [place];  Service: General;  Laterality: N/A;   SKIN CANCER EXCISION  2010   chest, basal cell   WISDOM TOOTH EXTRACTION  53 years old   Patient Active Problem List   Diagnosis Date Noted   Primary osteoarthritis of right hip 07/07/2023   Chronic pain of left thumb 07/07/2023   Perimenopausal symptoms 11/04/2022   Stress incontinence 03/10/2022   Jaundice 01/06/2022   Dyspnea on exertion 01/06/2022   B12 deficiency 10/15/2021   Vitamin D  deficiency 10/15/2021   Encounter for weight management 04/15/2021   OSA (obstructive sleep apnea) 01/23/2021   Hyperlipidemia 08/02/2019   Controlled type 2 diabetes mellitus (HCC) 11/16/2018   Secondary oligomenorrhea 11/11/2018   Dyspareunia in  female 02/04/2018   Internal hemorrhoid, bleeding 12/23/2017   Severe obesity (BMI >= 40) (HCC) 09/14/2013   Lichen simplex chronicus 02/03/2012   Fissure in ano 01/02/2011   Allergic rhinitis due to allergen 12/28/2008   LUMBAGO 04/05/2006   Depression 02/04/2006   Hemorrhoids, external 02/04/2006   Irritable bowel syndrome with constipation 02/04/2006    PCP: Dr [name]  REFERRING PROVIDER: Dr Debby Petties  REFERRING DIAG: OA R hip   THERAPY DIAG:  Pain in right hip  Other symptoms and signs involving the musculoskeletal system  Muscle weakness (generalized)  Rationale for Evaluation and Treatment: Rehabilitation  ONSET DATE: 05/01/23  SUBJECTIVE:   SUBJECTIVE STATEMENT: Patient reports she was sick last week with a stomach bug last weekend.  Starting to slowly get energy back. She reports she had some soreness after last aquatic session, but in a good way.   PERTINENT HISTORY: AODM; denies any other medical or musculoskeletal problems   PAIN:  Are you having pain? no Yes: NPRS scale: 0/10 currently; worst 2/10 Pain location: R hip - deep sometimes radiating toward pelvic of toward LB Pain description: nagging, tightness Aggravating factors: sitting > 1 hour; walking > 10 min  Relieving factors: rest; changing positions; medication; heat; ice   PRECAUTIONS: None  WEIGHT BEARING RESTRICTIONS: No  FALLS:  Has patient fallen in  last 6 months? 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  DIAGNOSTIC FINDINGS: Xray R hip 06/07/23: The hip joint spaces preserved. There is mild acetabular spurring. No evidence of fracture, erosion, or avascular necrosis. Slight degenerative change of the sacroiliac joints. Pubic symphysis is congruent. No evidence of focal bone lesion or bone destruction. Multiple phleboliths in the pelvis.   IMPRESSION: Mild degenerative acetabular spurring of the right hip.  PATIENT SURVEYS:  LEFS 35/80; 43.8%    SENSATION: WFL  EDEMA:  Some edema in R anterior hip on an intermittent basis   MUSCLE LENGTH: Hamstrings: Right 60 deg; Left 65 deg Thomas test: Right -10-15 deg; Left -5-10 deg  POSTURE: knees hyperextended; increased lumbar lordosis   PALPATION: Muscular tightness to palpation through the R anterior hip; hip flexors; groin; posterior hip - piriformis, glut med, ITB to lesser extent glut max  LOWER EXTREMITY ROM:  Active ROM Right eval Left eval  Hip flexion Tight 90 deg Tight 100 deg   Hip extension (-)10-15 (-)5-10  Hip abduction Tight  Tight   Hip adduction    Hip internal rotation ~ 25 deg sitting  ~ 35 deg sitting   Hip external rotation ~20 deg sitting  ~ 20 deg sitting   Knee flexion WFLs WFLs  Knee extension Hyperextension  Hyperextension   Ankle dorsiflexion    Ankle plantarflexion    Ankle inversion    Ankle eversion     (Blank rows = not tested)  LOWER EXTREMITY MMT:  MMT Right eval Right 08/11/23 Left eval Left  08/11/23 Right 08/31/23 Left 08/31/23  Hip flexion 4- 5- 5- 5 4+ 5  Hip extension 4 4+ 4 5 4+ 5  Hip abduction 4- 4 4 5 5 5   Hip adduction        Hip internal rotation        Hip external rotation        Knee flexion 5  5     Knee extension 5  5     Ankle dorsiflexion        Ankle plantarflexion        Ankle inversion        Ankle eversion         (Blank rows = not tested)  LOWER EXTREMITY SPECIAL TESTS:  Hip special tests: FADIR test positive;  Belvie (FABER) test: negative, Trendelenburg test: negative, Thomas test: positive for hip flexor tightness , SI compression test: negative, SI distraction test: negative, and Piriformis test: positive for muscular tightness   FUNCTIONAL TESTS:  5 times sit to stand: 12.34 sec weight shifted to L    GAIT: Distance walked: 40 ft Assistive device utilized: None Level of assistance: Complete Independence Comments: LE's in ER no asymmetry noted   07/14/23: assessment of gait pattern on treadmill shows positive Trendelenburg on R (with wt bearing on R she has drop of L hemipelvis)   [facility] Adult PT Treatment:  09/02/23 Pt seen for aquatic therapy today.  Treatment took place in water 3.5-4.75 ft in depth at the [facility] pool. Temp of water was 91.  Pt entered/exited the pool via stairs independently with bilat rail.  - unsupported walking forward/ backward, multiple laps, cues for core engagement and vertical trunk - side stepping  with arm addct/ abdct with yellow hand floats -> into wide squat - suitcase carry, marching backwards/forwards with bilat yellow and single blue - UE on yellow hand floats:  hip add/abd 2 x 10 (crossing midline) ; hip flexion /extension x 10 each LE; single heel raise 2 x 10 RLE, x 10 LLE - noodle stomp with solid -> thick yellow noodle - standing balance on yellow noodle-> marching-> squats -> squats to catch air - kneeling balance on yellow noodle - plank with yellow noodle -> alternating hip ext - plank on yellow noodle-> yellow hand floats : plank <> supermans - 3 way LE stretch with ankle supported by solid noodle - quad/hip flexor stretch with ankle supported by noodle    [facility] Adult PT Treatment:                                                DATE: 08/31/2023 Therapeutic Exercise: Hip MMT Resisted side stepping + RTB crossed at ankles Resisted diagonal stepping fwd/bkwd + RTB crossed at ankles Marching  + RTB crossed at forefoot Neuromuscular re-ed: Dead bug progression: Isometric hold + orange PB brace Modified dead bug + orange PB brace Dead bug + orange PB Therapeutic Activity: LEFS    [facility] Adult PT Treatment:                                             08/27/23 Pt seen for aquatic therapy today.  Treatment took place in water 3.5-4.75 ft in depth at the [facility] pool. Temp of water was 91.  Pt entered/exited the pool via stairs independently with bilat rail.  - unsupported walking forward/ backward, multiple laps, cues for core engagement and vertical trunk - side stepping  with arm addct/ abdct with yellow hand floats -> into wide squat - UE on yellow hand floats:  hip add/abd 2 x 10 (trial of crossing midline) ; hip flexion /extension 2x 10 each LE - TrA set with yellow hand floats pull down to thighs -> to same side and opp side knee taps while marching forward backward - straddling noodle with no UE support: cycling  - 3 way LE stretch with ankle supported by hollow noodle - quad/hip flexor stretch with ankle supported by noodle    [facility] Adult PT Treatment:                                             08/20/23 Pt seen for aquatic therapy today.  Treatment took place in water 3.5-4.75 ft in depth at the [facility] pool. Temp of water was 91.  Pt entered/exited the pool via stairs independently with bilat rail.  - Intro to aquatic therapy principles - unsupported walking forward/ backward, multiple laps, cues for core engagement and vertical trunk - side  stepping -> with arm addct/ abdct with rainbow-> yellow hand floats -> into wide squat - UE on wall:  hip add/abd 2 x 10 (trial of crossing midline) ; hip flexion /extension x 10 each LE, cues to monitor height of LE; straight LE hip circles with toes in contact with ground  - TrA set with hollow long noodle-->solid noodle-> kick board-> rainbow hand floats-> yellow hand floats pull down to thighs x 5  each - straddling noodle with no UE support: cycling  - 3 way LE stretch with ankle supported by hollow noodle - quad/hip flexor stretch with ankle supported by noodle   [facility] Adult PT Treatment:                                                DATE: 08/19/23 Therapeutic Exercise:  Supine  Piriformis stretch w/ strap 30 sec x 3 Hamstring stretch with strap 30 sec x 3 ITB/lateral HS stretch with strap 30 sec x 3 Sitting Piriformis stretch 30 sec x 3  Sidelying  ITB stretch R LE hanging off table 30 sec x 3  Standing   Therapeutic Activity: Standing  Trunk flexion rolling over green therapeutic ball on lowered table to stretch lumbar spine 20 sec x 3  Side steps with blue TB crossed monster walk R/L x 5 ft x 6  Supine  4 part core (difficulty with pelvic floor contraction)  Hip abduction alternating LE's blue TB x 10 x 2   Bridging with blue TB distal thighs 3 sec x 10  Sitting  Trunk flexion in chair Anterior posterior pelvic tilt in sitting  Quadruped  Cat camel x 10 (difficulty WB through UE's) Child's pose 20 sec x 3 (difficulty WB through UE's) Manual:   Deep tissue release work through the R ITB and lateral hip patient in L sidelying in ITB stretch position   Deep tissue work through posterior R hip with PROM hip IR/ER stretch hip extended, knee flexed   Self Care: Myofacial ball release work standing     PATIENT EDUCATION:  Education details:aquatic therapy intro  Person educated: Patient Education method: Programmer, multimedia, Facilities manager, Actor cues, Verbal cues,  Education comprehension: verbalized understanding, returned demonstration, verbal cues required, tactile cues required, and needs further education  HOME EXERCISE PROGRAM: Access Code: [email or web address] URL: [email or web address] Date: 08/19/2023 Prepared by: Celyn Holt  Exercises - Hip Flexor Stretch at [facility] of Bed  - 2 x daily - 7 x weekly - 1 sets - 3 reps - 30 sec  hold - Seated Hip Flexor  Stretch  - 2 x daily - 7 x weekly - 1 sets - 3 reps - 30 sec  hold - Supine Piriformis Stretch with Leg Straight  - 2 x daily - 7 x weekly - 1 sets - 3 reps - 30 sec  hold - Supine ITB Stretch with Strap  - 2 x daily - 7 x weekly - 1 sets - 3 reps - 30 sec  hold - Sidelying Hip Abduction  - 1 x daily - 7 x weekly - 3 sets - 10 reps - 3-5 sec  hold - Hooklying Isometric Clamshell  - 1 x daily - 7 x weekly - 1 sets - 10 reps - 3 sec  hold - Standing Hip Abduction with Resistance at Ankles and Counter Support  - 1 x daily -  7 x weekly - 1-2 sets - 10 reps - 3-5 sec  hold - Standing Piriformis Release with Ball at [employer]  - 2 x daily - 7 x weekly - 30-60 sec  hold - Clamshell  - 1 x daily - 7 x weekly - 1-2 sets - 10 reps - 3 sec  hold - Sidelying Reverse Clamshell  - 1 x daily - 7 x weekly - 1-2 sets - 10 reps - 3 sec  hold - Standing Hip Extension with Counter Support  - 2 x daily - 7 x weekly - 1-2 sets - 10 reps - 3-5 sec  hold - Standing Hip Abduction with Counter Support  - 1 x daily - 7 x weekly - 2-3 sets - 10 reps - 2-3 sec  hold - Side Stepping with Resistance at Thighs  - 1 x daily - 7 x weekly - Sidelying ITB Stretch off Table  - 1 x daily - 7 x weekly - 1 sets - 3 reps - 30 sec  hold - Supine Bridge with Resistance Band  - 1 x daily - 7 x weekly - 1-2 sets - 10 reps - 5-10 sec  hold - Supine Transversus Abdominis Bracing with Pelvic Floor Contraction  - 2 x daily - 7 x weekly - 1 sets - 10 reps - 10sec  hold - Prone Flexion Stretch on Swiss Ball  - 2 x daily - 7 x weekly - 1 sets - 3 reps - 30 sec  hold - Seated Flexion Stretch  - 2 x daily - 7 x weekly - 1 sets - 3 reps - 30 sec  hold - Side Stepping with Resistance at Ankles and Counter Support  - 1 x daily - 7 x weekly - 1 sets -  sec  hold   AQUATIC Access Code: [email or web address] URL: [email or web address] Date: 10/14/2023 Prepared by: [facility] - Bosie Rakers * sent to patient via mail. This aquatic home  exercise program from MedBridge utilizes pictures from land based exercises, but has been adapted prior to lamination and issuance.    ASSESSMENT:  CLINICAL IMPRESSION: Good tolerance for progression of aquatic exercises today.  Will create HEP for issuance next visit.  No increase in pain in R hip during session.  Progressing well towards remaining goals.    OBJECTIVE IMPAIRMENTS: decreased activity tolerance, decreased balance, decreased endurance, decreased mobility, difficulty walking, decreased ROM, decreased strength, increased fascial restrictions, increased muscle spasms, impaired flexibility, postural dysfunction, obesity, and pain.    GOALS: Goals reviewed with patient? Yes  SHORT TERM GOALS: Target date: 08/23/2023  Independent in initial HEP  Baseline: Goal status: met  2.  Increase mobility R hip with hip extension to neutral with posterior pelvic tilt in supine  Baseline:  Goal status: met  3.  Increase R hip strength by 1/2 muscle grade in hip flexion abduction, extension Baseline:  Goal status: met   LONG TERM GOALS: Target date: 10/04/2023  Decrease R hip pain to 0-2/10 for functional activities including sitting or standing > one hour Baseline:  Goal status: MET  2.  Increase strength R hip to 4+/5 to 5/5  Baseline:  08/31/23: see above Goal status: MET  3.  Increase sitting time to 60-80 min with minimal to no increase in hip pain Baseline:  08/31/23: no pain with seat cushion/back support; mod pain with hard seat/no back support Goal status: IN PROGRESS  4.  Increase standing and walking tolerance to 60-80 minutes  Baseline:  Goal status: INITIAL  5.  Independent in HEP including aquatic program as indicated  Baseline:  Goal status: INITIAL  6.  Improve LEFS by 20 points  Baseline: 35/80; 43.8%  08/31/23: 55/80 = 68.8% Goal status: MET   PLAN:  PT FREQUENCY: 2x/week  PT DURATION: 8 weeks  PLANNED INTERVENTIONS: 97110-Therapeutic exercises,  97530- Therapeutic activity, 97112- Neuromuscular re-education, 97535- Self Care, 02859- Manual therapy, [phone]- Gait training, [phone]- Aquatic Therapy, [phone]- Ionotophoresis 4mg /ml Dexamethasone , Patient/Family education, Balance training, Stair training, Taping, Dry Needling, and Joint mobilization  PLAN FOR NEXT SESSION: review and progress exercises; education; manual work and modalities as indicated -strengthening glut med   Delon Aquas, PTA 09/02/23 8:47 AM [facility] [address] [place], [place], [address] Phone: [phone]   Fax:  [phone]  Addendum:  Delon Aquas, PTA 10/14/23 3:51 PM [facility] [address] [place], [place], [address] Phone: [phone]   Fax:  [phone]

## 2023-09-27 ENCOUNTER — Ambulatory Visit: Admitting: Family Medicine

## 2023-09-27 ENCOUNTER — Encounter: Payer: Self-pay | Admitting: Rehabilitative and Restorative Service Providers"

## 2023-09-27 ENCOUNTER — Encounter (HOSPITAL_BASED_OUTPATIENT_CLINIC_OR_DEPARTMENT_OTHER): Payer: Self-pay | Admitting: Physical Therapy

## 2023-09-27 ENCOUNTER — Encounter: Payer: Self-pay | Admitting: Family Medicine

## 2023-09-27 VITALS — BP 109/74 | HR 92 | Ht 66.0 in | Wt 243.0 lb

## 2023-09-27 DIAGNOSIS — R053 Chronic cough: Secondary | ICD-10-CM | POA: Diagnosis not present

## 2023-09-27 DIAGNOSIS — E559 Vitamin D deficiency, unspecified: Secondary | ICD-10-CM

## 2023-09-27 DIAGNOSIS — E119 Type 2 diabetes mellitus without complications: Secondary | ICD-10-CM | POA: Diagnosis not present

## 2023-09-27 DIAGNOSIS — Z7689 Persons encountering health services in other specified circumstances: Secondary | ICD-10-CM

## 2023-09-27 LAB — POCT GLYCOSYLATED HEMOGLOBIN (HGB A1C): Hemoglobin A1C: 5.7 % — AB (ref 4.0–5.6)

## 2023-09-27 NOTE — Assessment & Plan Note (Signed)
 Looks phenomenal.  Doing really well. Continue current regimen. Great job on her weight loss. Continue to workm on exercise and healthy food choices.   Lab Results  Component Value Date   HGBA1C 5.7 (A) 09/27/2023

## 2023-09-27 NOTE — Progress Notes (Signed)
 Established Patient Office Visit  Subjective  Patient ID: Kaitlyn Kirby, female    DOB: 03-Apr-1970  Age: 53 y.o. MRN: [id number]  Chief Complaint  Patient presents with   Diabetes   Cough    HPI  Diabetes - no hypoglycemic events. No wounds or sores that are not healing well. No increased thirst or urination. Checking glucose at home. Taking medications as prescribed without any side effects.doin well on Mounjaro .  She is tolerating it well. She says being retired she has been able to be more active. She is getting things done around the house and feels better.    Has had a chronic cough as well x 3 months mostly in AM with some mucouis. Started after bad URI.  No freq SOB.  Notices it occ. Has had some post nasal drip       ROS    Objective:     BP 109/74   Pulse 92   Ht 5' 6 (1.676 m)   Wt 243 lb 0.6 oz (110.2 kg)   SpO2 97%   BMI 39.23 kg/m     Physical Exam Constitutional:      Appearance: Normal appearance.  HENT:     Head: Normocephalic and atraumatic.     Right Ear: Tympanic membrane, ear canal and external ear normal. There is no impacted cerumen.     Left Ear: Tympanic membrane, ear canal and external ear normal. There is no impacted cerumen.     Nose: Nose normal.     Mouth/Throat:     Pharynx: Oropharynx is clear.   Eyes:     Extraocular Movements: Extraocular movements intact.     Conjunctiva/sclera: Conjunctivae normal.     Pupils: Pupils are equal, round, and reactive to light.   Neck:     Thyroid : No thyromegaly.   Cardiovascular:     Rate and Rhythm: Normal rate and regular rhythm.  Pulmonary:     Effort: Pulmonary effort is normal.     Breath sounds: Normal breath sounds.  Abdominal:     General: Bowel sounds are normal.     Palpations: Abdomen is soft.     Tenderness: There is no abdominal tenderness.   Musculoskeletal:        General: No swelling.     Cervical back: Neck supple. No tenderness.  Lymphadenopathy:     Cervical: No  cervical adenopathy.   Skin:    General: Skin is warm and dry.   Neurological:     Mental Status: She is alert and oriented to person, place, and time.   Psychiatric:        Mood and Affect: Mood normal.        Behavior: Behavior normal.      Results for orders placed or performed in visit on 09/27/23  POCT HgB A1C  Result Value Ref Range   Hemoglobin A1C 5.7 (A) 4.0 - 5.6 %   HbA1c POC (<> result, manual entry)     HbA1c, POC (prediabetic range)     HbA1c, POC (controlled diabetic range)         The 10-year ASCVD risk score (Arnett DK, et al., 2019) is: 2.9%    Assessment & Plan:   Problem List Items Addressed This Visit       Endocrine   Controlled type 2 diabetes mellitus (HCC) - Primary   Looks phenomenal.  Doing really well. Continue current regimen. Great job on her weight loss. Continue to workm on exercise  and healthy food choices.   Lab Results  Component Value Date   HGBA1C 5.7 (A) 09/27/2023         Relevant Orders   POCT HgB A1C (Completed)   CMP14+EGFR     Other   Vitamin D  deficiency   Recheck levels. It was still low last time.       Relevant Orders   CMP14+EGFR   VITAMIN D  25 Hydroxy (Vit-D Deficiency, Fractures)   Encounter for weight management   Visit #:6 Starting Tzphyu:715 lbs    Current weight: 243 Previous weight: 274 lbs  Change in weight: down 31 lbs  Goal weight: TBD Dietary goals: Continue to portion control she is eating 6 small meals a day, work on increasing vegetable intake Exercise goals: Continue with daily walking and hopefully will be able to add in water aerobics twice a week Medication: Mounjaro  12.5mg   Follow-up and referrals: 3 mo      Other Visit Diagnoses       Chronic cough       Relevant Orders   CMP14+EGFR      Cough, chronic  - rec trial of nasal steroid spray like Flonase  2 spray in each nostril daily x 1 week and then 1 spray daily. If not helping please let me know.  Could consider a trial  of a PPI at that time if needed.  She has some occasional GERD but is not frequent.  And she feels like it is more related to drainage she actually sits slightly elevated in bed at night because she has 1 of those beds that can raise up she has she is not flat on her back.  Return in about 4 months (around 01/27/2024) for Diabetes follow-up.    [name], MD

## 2023-09-27 NOTE — Assessment & Plan Note (Signed)
 Recheck levels. It was still low last time.

## 2023-09-27 NOTE — Assessment & Plan Note (Signed)
 Visit #:6 Starting Tzphyu:715 lbs    Current weight: 243 Previous weight: 274 lbs  Change in weight: down 31 lbs  Goal weight: TBD Dietary goals: Continue to portion control she is eating 6 small meals a day, work on increasing vegetable intake Exercise goals: Continue with daily walking and hopefully will be able to add in water aerobics twice a week Medication: Mounjaro  12.5mg   Follow-up and referrals: 3 mo

## 2023-09-28 ENCOUNTER — Encounter (HOSPITAL_BASED_OUTPATIENT_CLINIC_OR_DEPARTMENT_OTHER): Payer: Self-pay

## 2023-09-28 ENCOUNTER — Ambulatory Visit (HOSPITAL_BASED_OUTPATIENT_CLINIC_OR_DEPARTMENT_OTHER): Payer: Self-pay | Admitting: Physical Therapy

## 2023-09-28 ENCOUNTER — Ambulatory Visit: Payer: Self-pay | Admitting: Family Medicine

## 2023-09-28 LAB — CMP14+EGFR
ALT: 20 IU/L (ref 0–32)
AST: 16 IU/L (ref 0–40)
Albumin: 4.4 g/dL (ref 3.8–4.9)
Alkaline Phosphatase: 83 IU/L (ref 44–121)
BUN/Creatinine Ratio: 13 (ref 9–23)
BUN: 13 mg/dL (ref 6–24)
Bilirubin Total: 1.4 mg/dL — ABNORMAL HIGH (ref 0.0–1.2)
CO2: 17 mmol/L — ABNORMAL LOW (ref 20–29)
Calcium: 9.9 mg/dL (ref 8.7–10.2)
Chloride: 104 mmol/L (ref 96–106)
Creatinine, Ser: 0.99 mg/dL (ref 0.57–1.00)
Globulin, Total: 2.3 g/dL (ref 1.5–4.5)
Glucose: 101 mg/dL — ABNORMAL HIGH (ref 70–99)
Potassium: 4.1 mmol/L (ref 3.5–5.2)
Sodium: 141 mmol/L (ref 134–144)
Total Protein: 6.7 g/dL (ref 6.0–8.5)
eGFR: 68 mL/min/{1.73_m2} (ref >59)

## 2023-09-28 LAB — VITAMIN D 25 HYDROXY (VIT D DEFICIENCY, FRACTURES): Vit D, 25-Hydroxy: 30.9 ng/mL (ref 30.0–100.0)

## 2023-09-28 NOTE — Progress Notes (Signed)
 HI Kaitlyn Kirby, your liver and kidney looks good. Vitamin D  looks better. Keep taking your supplement for D.

## 2023-09-30 ENCOUNTER — Encounter: Payer: Self-pay | Admitting: Rehabilitative and Restorative Service Providers"

## 2023-09-30 ENCOUNTER — Ambulatory Visit: Payer: Self-pay | Attending: Sports Medicine | Admitting: Rehabilitative and Restorative Service Providers"

## 2023-09-30 DIAGNOSIS — R29898 Other symptoms and signs involving the musculoskeletal system: Secondary | ICD-10-CM | POA: Diagnosis present

## 2023-09-30 DIAGNOSIS — M6281 Muscle weakness (generalized): Secondary | ICD-10-CM | POA: Diagnosis present

## 2023-09-30 DIAGNOSIS — M25551 Pain in right hip: Secondary | ICD-10-CM | POA: Diagnosis present

## 2023-09-30 NOTE — Therapy (Signed)
 OUTPATIENT PHYSICAL THERAPY LOWER EXTREMITY TREATMENT PHYSICAL THERAPY DISCHARGE SUMMARY  Visits from Start of Care: 9  Current functional level related to goals / functional outcomes: See progress note for discharge status    Remaining deficits: Needs to continue with HEP    Education / Equipment: HEP    Patient agrees to discharge. Patient goals were met. Patient is being discharged due to meeting the stated rehab goals.  Jezebelle Ledwell P. Ina PT, MPH 09/30/23 9:31 AM     Patient Name: Kaitlyn Kirby MRN: [id number] DOB:January 15, 1971, 53 y.o., female Today's Date: 09/30/2023  END OF SESSION:  PT End of Session - 09/30/23 0847     Visit Number 9    Number of Visits 24    Date for PT Re-Evaluation 10/04/23    Authorization Type state health plan [employer] $1500 deduct coins 70/30; copay - none - no auth         Past Medical History:  Diagnosis Date   Abnormal Pap smear 1995   [place]   Allergy    Asthma    seasonal   Basal cell carcinoma 2010   Depression    DM type 2 (diabetes mellitus, type 2) (HCC)    Hemorrhoids    IBS (irritable bowel syndrome) 8-10 years ago   PONV (postoperative nausea and vomiting)    Rectal fissure    Past Surgical History:  Procedure Laterality Date   EYE SURGERY Right 2017   clogged tear duct   HEMORRHOID SURGERY  06/22/2019   MINOR SPHINCTEROTOMY N/A 02/07/2020   Procedure: ANAL SPHINCTEROTOMY.HEMORRHOIDECTOMY.ANORECTAL EXAMINATION UNDER ANESTHESIA, EXCISION OF ANAL POLYPS;  Surgeon: Sheldon Standing, MD;  Location: [facility];  Service: General;  Laterality: N/A;   SKIN CANCER EXCISION  2010   chest, basal cell   WISDOM TOOTH EXTRACTION  53 years old   Patient Active Problem List   Diagnosis Date Noted   Primary osteoarthritis of right hip 07/07/2023   Chronic pain of left thumb 07/07/2023   Perimenopausal symptoms 11/04/2022   Stress incontinence 03/10/2022   Jaundice 01/06/2022   Dyspnea on exertion 01/06/2022   B12  deficiency 10/15/2021   Vitamin D  deficiency 10/15/2021   Encounter for weight management 04/15/2021   OSA (obstructive sleep apnea) 01/23/2021   Hyperlipidemia 08/02/2019   Controlled type 2 diabetes mellitus (HCC) 11/16/2018   Secondary oligomenorrhea 11/11/2018   Dyspareunia in female 02/04/2018   Internal hemorrhoid, bleeding 12/23/2017   Severe obesity (BMI >= 40) (HCC) 09/14/2013   Lichen simplex chronicus 02/03/2012   Fissure in ano 01/02/2011   Allergic rhinitis due to allergen 12/28/2008   LUMBAGO 04/05/2006   Depression 02/04/2006   Hemorrhoids, external 02/04/2006   Irritable bowel syndrome with constipation 02/04/2006    PCP: Dr [name]  REFERRING PROVIDER: Dr Debby Petties  REFERRING DIAG: OA R hip   THERAPY DIAG:  Pain in right hip  Other symptoms and signs involving the musculoskeletal system  Muscle weakness (generalized)  Rationale for Evaluation and Treatment: Rehabilitation  ONSET DATE: 05/01/23  SUBJECTIVE:   SUBJECTIVE STATEMENT: Patient reports she was sick last week with a stomach bug which is why she missed PT; states her energy levels are low as a result. Patient states her hip is feeling stronger and less pain with aquatic therapy, overall it feels more stable.   PERTINENT HISTORY: AODM; denies any other medical or musculoskeletal problems   PAIN:  Are you having pain?  Yes: NPRS scale: 0/10 currently; worst 2/10 Pain location:  R hip - deep sometimes radiating toward pelvic of toward LB Pain description: nagging, tightness Aggravating factors: sitting > 1 hour; walking > 10 min  Relieving factors: rest; changing positions; medication; heat; ice   PRECAUTIONS: None  WEIGHT BEARING RESTRICTIONS: No  FALLS:  Has patient fallen in last 6 months? 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  DIAGNOSTIC FINDINGS: Xray R hip 06/07/23: The hip joint spaces preserved. There is mild acetabular spurring. No evidence of fracture, erosion, or avascular necrosis. Slight degenerative change of the sacroiliac joints. Pubic symphysis is congruent. No evidence of focal bone lesion or bone destruction. Multiple phleboliths in the pelvis.   IMPRESSION: Mild degenerative acetabular spurring of the right hip.  PATIENT SURVEYS:  LEFS 35/80; 43.8%    SENSATION: WFL  EDEMA:  Some edema in R anterior hip on an intermittent basis   MUSCLE LENGTH: Hamstrings: Right 60 deg; Left 65 deg Thomas test: Right -10-15 deg; Left -5-10 deg  POSTURE: knees hyperextended; increased lumbar lordosis   PALPATION: Muscular tightness to palpation through the R anterior hip; hip flexors; groin; posterior hip - piriformis, glut med, ITB to lesser extent glut max  LOWER EXTREMITY ROM:  Active ROM Right eval Right  09/30/23 Left eval  Hip flexion Tight 90 deg 125 Tight 100 deg   Hip extension (-)10-15 10 (-)5-10  Hip abduction Tight   Tight   Hip adduction     Hip internal rotation ~ 25 deg sitting  35 ~ 35 deg sitting   Hip external rotation ~20 deg sitting  30 ~ 20 deg sitting   Knee flexion WFLs  WFLs  Knee extension Hyperextension   Hyperextension   Ankle dorsiflexion     Ankle plantarflexion     Ankle inversion     Ankle eversion      (Blank rows = not tested)  LOWER EXTREMITY MMT:  MMT Right eval Right 08/11/23 Left eval Left  08/11/23 Right 08/31/23 Left 08/31/23 Right  09/30/23  Hip flexion 4- 5- 5- 5 4+ 5 5  Hip  extension 4 4+ 4 5 4+ 5 5  Hip abduction 4- 4 4 5 5 5 5   Hip adduction         Hip internal rotation         Hip external rotation         Knee flexion 5  5      Knee extension 5  5      Ankle dorsiflexion         Ankle plantarflexion         Ankle inversion         Ankle eversion          (Blank rows = not tested)  LOWER EXTREMITY SPECIAL TESTS:  Hip special tests: FADIR test positive; Patrick (FABER) test: negative, Trendelenburg test: negative, Thomas test: positive for hip flexor tightness , SI compression test: negative, SI distraction test: negative, and Piriformis  test: positive for muscular tightness   FUNCTIONAL TESTS:  5 times sit to stand: 12.34 sec weight shifted to L  09/30/23: 8.55 sec equal wt LE's    GAIT: Distance walked: 40 ft Assistive device utilized: None Level of assistance: Complete Independence Comments: LE's in ER no asymmetry noted   07/14/23: assessment of gait pattern on treadmill shows positive Trendelenburg on R (with wt bearing on R she has drop of L hemipelvis)    [facility] Adult PT Treatment:                                                DATE: 09/30/23 Therapeutic Exercise: Exercise review for HEP  Therapeutic Activity: Sit to stand x 10 Sit to stand - slow stand to sit Modified dead lift 10 # KB x 10  Squat with purple super band x 10  Paloff press blue TB x 10 R/L  Variations of paloff press - shd flex/ext; circles Cw/CCW; figure 8  Self Care: Education re-exercise program; varying exercises; consistency with HEP    [facility] Adult PT Treatment:                                                DATE: 08/31/2023 Therapeutic Exercise: Hip MMT Resisted side stepping + RTB crossed at ankles Resisted diagonal stepping fwd/bkwd + RTB crossed at ankles Marching + RTB crossed at forefoot Neuromuscular re-ed: Dead bug progression: Isometric hold + orange PB brace Modified dead bug + orange PB brace Dead bug + orange PB Therapeutic  Activity: LEFS    [facility] Adult PT Treatment:                                             08/27/23 Pt seen for aquatic therapy today.  Treatment took place in water 3.5-4.75 ft in depth at the [facility] pool. Temp of water was 91.  Pt entered/exited the pool via stairs independently with bilat rail.  - unsupported walking forward/ backward, multiple laps, cues for core engagement and vertical trunk - side stepping  with arm addct/ abdct with yellow hand floats -> into wide squat - UE on yellow hand floats:  hip add/abd 2 x 10 (trial of crossing midline) ; hip flexion /extension 2x 10 each LE - TrA set with yellow hand floats pull down to thighs -> to same side and opp side knee taps while marching forward backward - straddling noodle with no UE support: cycling  - 3 way LE stretch with ankle supported by hollow noodle - quad/hip flexor stretch with ankle supported by noodle    [facility] Adult PT Treatment:                                             08/20/23 Pt seen for aquatic therapy today.  Treatment took place in water 3.5-4.75 ft in depth at the [facility] pool. Temp of water was 91.  Pt entered/exited the pool via stairs independently with bilat rail.  -  Intro to aquatic therapy principles - unsupported walking forward/ backward, multiple laps, cues for core engagement and vertical trunk - side stepping -> with arm addct/ abdct with rainbow-> yellow hand floats -> into wide squat - UE on wall:  hip add/abd 2 x 10 (trial of crossing midline) ; hip flexion /extension x 10 each LE, cues to monitor height of LE; straight LE hip circles with toes in contact with ground  - TrA set with hollow long noodle-->solid noodle-> kick board-> rainbow hand floats-> yellow hand floats pull down to thighs x 5 each - straddling noodle with no UE support: cycling  - 3 way LE stretch with ankle supported by hollow noodle - quad/hip flexor stretch with ankle supported by noodle    Pt  requires the buoyancy and hydrostatic pressure of water for support, and to offload joints by unweighting joint load by at least 50 % in navel deep water and by at least 75-80% in chest to neck deep water.  Viscosity of the water is needed for resistance of strengthening. Water current perturbations provides challenge to standing balance requiring increased core activation.    [facility] Adult PT Treatment:                                                DATE: 08/19/23 Therapeutic Exercise:  Supine  Piriformis stretch w/ strap 30 sec x 3 Hamstring stretch with strap 30 sec x 3 ITB/lateral HS stretch with strap 30 sec x 3 Sitting Piriformis stretch 30 sec x 3  Sidelying  ITB stretch R LE hanging off table 30 sec x 3  Standing   Therapeutic Activity: Standing  Trunk flexion rolling over green therapeutic ball on lowered table to stretch lumbar spine 20 sec x 3  Side steps with blue TB crossed monster walk R/L x 5 ft x 6  Supine  4 part core (difficulty with pelvic floor contraction)  Hip abduction alternating LE's blue TB x 10 x 2   Bridging with blue TB distal thighs 3 sec x 10  Sitting  Trunk flexion in chair Anterior posterior pelvic tilt in sitting  Quadruped  Cat camel x 10 (difficulty WB through UE's) Child's pose 20 sec x 3 (difficulty WB through UE's) Manual:   Deep tissue release work through the R ITB and lateral hip patient in L sidelying in ITB stretch position   Deep tissue work through posterior R hip with PROM hip IR/ER stretch hip extended, knee flexed   Self Care: Myofacial ball release work standing     PATIENT EDUCATION:  Education details:aquatic therapy intro  Person educated: Patient Education method: Programmer, multimedia, Facilities manager, Actor cues, Verbal cues,  Education comprehension: verbalized understanding, returned demonstration, verbal cues required, tactile cues required, and needs further education  HOME EXERCISE PROGRAM: Access Code: [email or web address] URL:  [email or web address] Date: 09/30/2023 Prepared by: Mitali Shenefield  Exercises - Hip Flexor Stretch at [facility] of Bed  - 2 x daily - 7 x weekly - 1 sets - 3 reps - 30 sec  hold - Seated Hip Flexor Stretch  - 2 x daily - 7 x weekly - 1 sets - 3 reps - 30 sec  hold - Supine Piriformis Stretch with Leg Straight  - 2 x daily - 7 x weekly - 1 sets - 3 reps - 30 sec  hold - Supine ITB Stretch  with Strap  - 2 x daily - 7 x weekly - 1 sets - 3 reps - 30 sec  hold - Sidelying Hip Abduction  - 1 x daily - 7 x weekly - 3 sets - 10 reps - 3-5 sec  hold - Hooklying Isometric Clamshell  - 1 x daily - 7 x weekly - 1 sets - 10 reps - 3 sec  hold - Standing Hip Abduction with Resistance at Ankles and Counter Support  - 1 x daily - 7 x weekly - 1-2 sets - 10 reps - 3-5 sec  hold - Standing Piriformis Release with Ball at [employer]  - 2 x daily - 7 x weekly - 30-60 sec  hold - Clamshell  - 1 x daily - 7 x weekly - 1-2 sets - 10 reps - 3 sec  hold - Sidelying Reverse Clamshell  - 1 x daily - 7 x weekly - 1-2 sets - 10 reps - 3 sec  hold - Standing Hip Extension with Counter Support  - 2 x daily - 7 x weekly - 1-2 sets - 10 reps - 3-5 sec  hold - Standing Hip Abduction with Counter Support  - 1 x daily - 7 x weekly - 2-3 sets - 10 reps - 2-3 sec  hold - Side Stepping with Resistance at Thighs  - 1 x daily - 7 x weekly - Sidelying ITB Stretch off Table  - 1 x daily - 7 x weekly - 1 sets - 3 reps - 30 sec  hold - Supine Bridge with Resistance Band  - 1 x daily - 7 x weekly - 1-2 sets - 10 reps - 5-10 sec  hold - Supine Transversus Abdominis Bracing with Pelvic Floor Contraction  - 2 x daily - 7 x weekly - 1 sets - 10 reps - 10sec  hold - Prone Flexion Stretch on Swiss Ball  - 2 x daily - 7 x weekly - 1 sets - 3 reps - 30 sec  hold - Seated Flexion Stretch  - 2 x daily - 7 x weekly - 1 sets - 3 reps - 30 sec  hold - Side Stepping with Resistance at Ankles and Counter Support  - 1 x daily - 7 x weekly - 1 sets -  sec   hold - Sit to Stand  - 2 x daily - 7 x weekly - 1 sets - 10 reps - 3-5 sec  hold - Deadlift with Pelvic Contraction  - Deadlift with Resistance  - 1 x daily - 3-4 x weekly - 1 sets - 10 reps - Anti-Rotation Lateral Stepping with Press  - 2 x daily - 7 x weekly - 1-2 sets - 10 reps - 2-3 sec  hold  ASSESSMENT:  CLINICAL IMPRESSION:  Excellent progress. Goals of therapy accomplished. Patient has incresaed functional activities with no pain. When she does stressful tasks and has some discomfort she can relief symptoms with exercise. Confident in continuing independent HEP. Pain to d/c to independent HEP. Patient will call with any questions or problems.    OBJECTIVE IMPAIRMENTS: decreased activity tolerance, decreased balance, decreased endurance, decreased mobility, difficulty walking, decreased ROM, decreased strength, increased fascial restrictions, increased muscle spasms, impaired flexibility, postural dysfunction, obesity, and pain.    GOALS: Goals reviewed with patient? Yes  SHORT TERM GOALS: Target date: 08/23/2023  Independent in initial HEP  Baseline: Goal status: met  2.  Increase mobility R hip with hip extension to neutral with posterior  pelvic tilt in supine  Baseline:  Goal status: met  3.  Increase R hip strength by 1/2 muscle grade in hip flexion abduction, extension Baseline:  Goal status: met   LONG TERM GOALS: Target date: 10/04/2023  Decrease R hip pain to 0-2/10 for functional activities including sitting or standing > one hour Baseline:  Goal status: MET  2.  Increase strength R hip to 4+/5 to 5/5  Baseline:  08/31/23: see above Goal status: MET  3.  Increase sitting time to 60-80 min with minimal to no increase in hip pain Baseline:  08/31/23: no pain with seat cushion/back support; mod pain with hard seat/no back support Goal status: met  4.  Increase standing and walking tolerance to 60-80 minutes  Baseline:  Goal status: met  5.  Independent in  HEP including aquatic program as indicated  Baseline:  Goal status: met  6.  Improve LEFS by 20 points  Baseline: 35/80; 43.8%  08/31/23: 55/80 = 68.8% Goal status: MET   PLAN:  PT FREQUENCY: 2x/week  PT DURATION: 8 weeks  PLANNED INTERVENTIONS: 97110-Therapeutic exercises, 97530- Therapeutic activity, 97112- Neuromuscular re-education, 97535- Self Care, 02859- Manual therapy, [phone]- Gait training, [phone]- Aquatic Therapy, [phone]- Ionotophoresis 4mg /ml Dexamethasone , Patient/Family education, Balance training, Stair training, Taping, Dry Needling, and Joint mobilization  PLAN FOR NEXT SESSION: d/c to independent HEP   Ellanora Rayborn P. Ina PT, MPH 09/30/23 8:52 AM

## 2023-10-24 ENCOUNTER — Other Ambulatory Visit: Payer: Self-pay | Admitting: Family Medicine

## 2023-10-24 DIAGNOSIS — R059 Cough, unspecified: Secondary | ICD-10-CM

## 2023-11-03 ENCOUNTER — Other Ambulatory Visit: Payer: Self-pay | Admitting: Family Medicine

## 2023-11-03 DIAGNOSIS — E119 Type 2 diabetes mellitus without complications: Secondary | ICD-10-CM

## 2023-11-20 ENCOUNTER — Other Ambulatory Visit: Payer: Self-pay | Admitting: Sports Medicine

## 2023-11-20 DIAGNOSIS — M1611 Unilateral primary osteoarthritis, right hip: Secondary | ICD-10-CM

## 2023-11-30 ENCOUNTER — Encounter: Payer: Self-pay | Admitting: Sports Medicine

## 2023-12-11 ENCOUNTER — Other Ambulatory Visit: Payer: Self-pay | Admitting: Family Medicine

## 2024-01-14 ENCOUNTER — Other Ambulatory Visit: Payer: Self-pay | Admitting: Family Medicine

## 2024-01-26 ENCOUNTER — Encounter: Payer: Self-pay | Admitting: Family Medicine

## 2024-01-26 ENCOUNTER — Ambulatory Visit: Admitting: Family Medicine

## 2024-01-26 VITALS — BP 107/70 | HR 80 | Ht 66.0 in | Wt 214.1 lb

## 2024-01-26 DIAGNOSIS — Z23 Encounter for immunization: Secondary | ICD-10-CM

## 2024-01-26 DIAGNOSIS — J301 Allergic rhinitis due to pollen: Secondary | ICD-10-CM | POA: Diagnosis not present

## 2024-01-26 DIAGNOSIS — E119 Type 2 diabetes mellitus without complications: Secondary | ICD-10-CM | POA: Diagnosis not present

## 2024-01-26 DIAGNOSIS — Z6835 Body mass index (BMI) 35.0-35.9, adult: Secondary | ICD-10-CM | POA: Diagnosis not present

## 2024-01-26 DIAGNOSIS — Z7985 Long-term (current) use of injectable non-insulin antidiabetic drugs: Secondary | ICD-10-CM

## 2024-01-26 LAB — POCT GLYCOSYLATED HEMOGLOBIN (HGB A1C): Hemoglobin A1C: 5.1 % (ref 4.0–5.6)

## 2024-01-26 NOTE — Progress Notes (Signed)
 Established Patient Office Visit  Patient ID: Kaitlyn Kirby, female    DOB: 1970/05/22  Age: 53 y.o. MRN: [id number] PCP: Alvan Kaitlyn BIRCH, MD  Chief Complaint  Patient presents with   Diabetes    Subjective:     HPI  Discussed the use of AI scribe software for clinical note transcription with the patient, who gave verbal consent to proceed.  History of Present Illness  53 year old female who presents for follow-up and medication management.  Gynecological health maintenance - No gynecological visit since approximately 2000 - Uncertain about Pap smear and HPV co-testing history; recalls Pap smear in 2022 but unsure if HPV co-testing was performed - Seeks clarification regarding need for repeat Pap smear for health maintenance  Diabetes  - Currently using Mounjaro  for DM control and weight management - Significant weight loss and improved energy levels since starting Mounjaro  - Attributes improvements to retirement, lifestyle modifications, and medication - Recent hemoglobin A1c measured at 5.1  Medication administration concerns - Planning a three-week trip with travel to multiple locations, including a cruise ship and [place] - Concerned about managing Mounjaro  injections during travel due to potential lack of refrigeration  Allergic rhinitis and respiratory symptoms - Morning allergies, attributed to dust from home renovations - Uses albuterol  as needed; chest symptoms remain stable - Uses Flonase  with effective symptom control  Lifestyle and dietary habits - Recent retirement has reduced stress and improved overall health - Increased physical activity and attention to diet - Occasionally forgets to eat and emphasizes importance of protein intake       ROS    Objective:     BP 107/70   Pulse 80   Ht 5' 6 (1.676 m)   Wt 214 lb 1.3 oz (97.1 kg)   SpO2 99%   BMI 34.55 kg/m    Physical Exam Vitals reviewed.  Constitutional:       Appearance: Normal appearance.  HENT:     Head: Normocephalic.  Pulmonary:     Effort: Pulmonary effort is normal.  Neurological:     Mental Status: She is alert and oriented to person, place, and time.  Psychiatric:        Mood and Affect: Mood normal.        Behavior: Behavior normal.      Results for orders placed or performed in visit on 01/26/24  POCT HgB A1C  Result Value Ref Range   Hemoglobin A1C 5.1 4.0 - 5.6 %   HbA1c POC (<> result, manual entry)     HbA1c, POC (prediabetic range)     HbA1c, POC (controlled diabetic range)        The 10-year ASCVD risk score (Arnett DK, et al., 2019) is: 2.8%    Assessment & Plan:   Problem List Items Addressed This Visit       Respiratory   Allergic rhinitis due to allergen   Allergic rhinitis Allergic rhinitis managed effectively with Flonase . Potential allergen exposure during travel discussed. - Continue Flonase . - Take Flonase  during travel to manage potential allergen exposure.        Endocrine   Type 2 diabetes mellitus without complication, without long-term current use of insulin (HCC) - Primary    Type 2 diabetes mellitus,  Type 2 diabetes mellitus with A1c of 5.1. Excellent glycemic control achieved, no longer requires metformin . - Discontinue metformin .  Challenges anticipated during upcoming travel regarding medication storage and administration. Consideration of temporary discontinuation of Mounjaro  and potential use  of metformin  during travel. - Continue Mounjaro  therapy. - Explore refrigeration options for Mounjaro  during travel. - Consider temporary discontinuation of Mounjaro  during extended travel. - Discuss potential use of metformin  during travel if Mounjaro  is discontinued.        Other   BMI 35.0-35.9,adult   Other Visit Diagnoses       Encounter for immunization       Relevant Orders   Flu vaccine trivalent PF, 6mos and older(Flulaval,Afluria,Fluarix,Fluzone) (Completed)        Assessment and Plan Assessment & Plan  Emphasized balanced nutrition with adequate protein intake. - Continue current health and wellness practices. - Emphasize balanced nutrition with adequate protein intake.   General Health Maintenance Routine Pap smear and HPV co-testing discussed. Vaccination status reviewed with recommendation for pneumococcal vaccine for those over 50. - Review Pap smear and HPV co-testing status. - Administer pneumococcal vaccine if desired. - Schedule Pap smear if HPV co-testing was not done.   Return in about 4 months (around 05/27/2024).    Kaitlyn Byars, MD [facility] at [facility]

## 2024-01-26 NOTE — Assessment & Plan Note (Signed)
  Type 2 diabetes mellitus,  Type 2 diabetes mellitus with A1c of 5.1. Excellent glycemic control achieved, no longer requires metformin . - Discontinue metformin .  Challenges anticipated during upcoming travel regarding medication storage and administration. Consideration of temporary discontinuation of Mounjaro  and potential use of metformin  during travel. - Continue Mounjaro  therapy. - Explore refrigeration options for Mounjaro  during travel. - Consider temporary discontinuation of Mounjaro  during extended travel. - Discuss potential use of metformin  during travel if Mounjaro  is discontinued.

## 2024-01-26 NOTE — Assessment & Plan Note (Signed)
 Allergic rhinitis Allergic rhinitis managed effectively with Flonase . Potential allergen exposure during travel discussed. - Continue Flonase . - Take Flonase  during travel to manage potential allergen exposure.

## 2024-01-27 ENCOUNTER — Ambulatory Visit: Payer: Self-pay | Admitting: Family Medicine

## 2024-01-27 LAB — CMP14+EGFR
ALT: 16 IU/L (ref 0–32)
AST: 19 IU/L (ref 0–40)
Albumin: 4.5 g/dL (ref 3.8–4.9)
Alkaline Phosphatase: 80 IU/L (ref 49–135)
BUN/Creatinine Ratio: 20 (ref 9–23)
BUN: 17 mg/dL (ref 6–24)
Bilirubin Total: 1.5 mg/dL — ABNORMAL HIGH (ref 0.0–1.2)
CO2: 24 mmol/L (ref 20–29)
Calcium: 10.1 mg/dL (ref 8.7–10.2)
Chloride: 102 mmol/L (ref 96–106)
Creatinine, Ser: 0.86 mg/dL (ref 0.57–1.00)
Globulin, Total: 2.3 g/dL (ref 1.5–4.5)
Glucose: 83 mg/dL (ref 70–99)
Potassium: 4.1 mmol/L (ref 3.5–5.2)
Sodium: 140 mmol/L (ref 134–144)
Total Protein: 6.8 g/dL (ref 6.0–8.5)
eGFR: 81 mL/min/1.73 (ref >59)

## 2024-01-27 LAB — LIPID PANEL
Chol/HDL Ratio: 3.4 ratio (ref 0.0–4.4)
Cholesterol, Total: 131 mg/dL (ref 100–199)
HDL: 38 mg/dL — ABNORMAL LOW (ref >39)
LDL Chol Calc (NIH): 71 mg/dL (ref 0–99)
Triglycerides: 125 mg/dL (ref 0–149)
VLDL Cholesterol Cal: 22 mg/dL (ref 5–40)

## 2024-01-27 NOTE — Progress Notes (Signed)
 Hi Kenyata, triglycerides are down and look great.  LDL looks better as well great job.  Metabolic panel overall looks good.

## 2024-02-28 HISTORY — PX: OTHER SURGICAL HISTORY: SHX169

## 2024-03-29 ENCOUNTER — Other Ambulatory Visit (HOSPITAL_COMMUNITY)
Admission: RE | Admit: 2024-03-29 | Discharge: 2024-03-29 | Disposition: A | Discharge status: Home / Self Care | Source: Ambulatory Visit | Attending: Obstetrics and Gynecology | Admitting: Obstetrics and Gynecology

## 2024-03-29 ENCOUNTER — Encounter: Payer: Self-pay | Admitting: Obstetrics and Gynecology

## 2024-03-29 ENCOUNTER — Ambulatory Visit: Admitting: Obstetrics and Gynecology

## 2024-03-29 DIAGNOSIS — Z124 Encounter for screening for malignant neoplasm of cervix: Secondary | ICD-10-CM | POA: Insufficient documentation

## 2024-03-29 DIAGNOSIS — N950 Postmenopausal bleeding: Secondary | ICD-10-CM | POA: Diagnosis present

## 2024-03-29 DIAGNOSIS — R102 Pelvic and perineal pain unspecified side: Secondary | ICD-10-CM | POA: Diagnosis not present

## 2024-03-29 LAB — POCT URINALYSIS DIPSTICK
Bilirubin, UA: NEGATIVE
Blood, UA: NEGATIVE
Glucose, UA: NEGATIVE
Ketones, UA: NEGATIVE
Leukocytes, UA: NEGATIVE
Nitrite, UA: NEGATIVE
Protein, UA: NEGATIVE
Spec Grav, UA: 1.02
Urobilinogen, UA: 0.2 U/dL
pH, UA: 7.5

## 2024-03-29 NOTE — Addendum Note (Signed)
 Addended by: ORLINDA SILVANO ORN on: 03/29/2024 11:44 AM   Modules accepted: Orders

## 2024-03-29 NOTE — Progress Notes (Signed)
 Verbal received for 800mmg Ibuprofen . Given to patient post EMB.  NDC 9095-4144-38 Lot #[id number] Expiration 12/28/2024  Silvano LELON Piano, RN

## 2024-03-29 NOTE — Progress Notes (Signed)
 53 yo P0 postmenopausal with BMI 33 presenting for the evaluation of postmenopausal vaginal bleeding. Patient reports menopausal state for 2 years. She noted some vaginal bleeding lasting 3 days and enough to wear a pad a few weeks ago. Patient also experienced some vaginal pruritus and self treated with Azo, uncertain if it was a UTI. Patient reports resolution of both bleeding and pruritus. Patient is sexually active without dyspareunia related to vaginal dryness. She is without any other complaints    Past Medical History:  Diagnosis Date   Abnormal Pap smear 1995   Colpo   Allergy    Asthma    seasonal   Basal cell carcinoma 2010   Depression    DM type 2 (diabetes mellitus, type 2) (HCC)    Hemorrhoids    IBS (irritable bowel syndrome) 8-10 years ago   PONV (postoperative nausea and vomiting)    Rectal fissure    Past Surgical History:  Procedure Laterality Date   EYE SURGERY Right 2017   clogged tear duct   HEMORRHOID SURGERY  06/22/2019   MINOR SPHINCTEROTOMY N/A 02/07/2020   Procedure: ANAL SPHINCTEROTOMY.HEMORRHOIDECTOMY.ANORECTAL EXAMINATION UNDER ANESTHESIA, EXCISION OF ANAL POLYPS;  Surgeon: Sheldon Standing, MD;  Location: [facility] [place];  Service: General;  Laterality: N/A;   SKIN CANCER EXCISION  2010   chest, basal cell   skin cancer removed  02/2024   WISDOM TOOTH EXTRACTION  53 years old   Family History  Problem Relation Age of Onset   Hypertension Mother    Thyroid  disease Mother    Hypertension Father    Heart attack Father    Diabetes Brother        type 1    Social History   Socioeconomic History   Marital status: Single    Spouse name: Not on file   Number of children: Not on file   Years of education: Not on file   Highest education level: Master's degree (e.g., MA, MS, MEng, MEd, MSW, MBA)  Occupational History   Occupation: runner, broadcasting/film/video  Tobacco Use   Smoking status: Former    Current packs/day: 0.00    Average packs/day: 0.5 packs/day     Types: Cigarettes    Quit date: 11/10/1995    Years since quitting: 28.4   Smokeless tobacco: Never   Tobacco comments:    social teeange yrs  Vaping Use   Vaping status: Never Used  Substance and Sexual Activity   Alcohol use: Yes    Comment: occasional   Drug use: No   Sexual activity: Yes    Partners: Male    Birth control/protection: Condom  Other Topics Concern   Not on file  Social History Narrative   Not on file   Social Drivers of Health   Tobacco Use: Medium Risk (03/29/2024)   Patient History    Smoking Tobacco Use: Former    Smokeless Tobacco Use: Never    Passive Exposure: Not on Actuary Strain: Low Risk (01/25/2024)   Overall Financial Resource Strain (CARDIA)    Difficulty of Paying Living Expenses: Not hard at all  Food Insecurity: No Food Insecurity (01/25/2024)   Epic    Worried About Radiation Protection Practitioner of Food in the Last Year: Never true    Ran Out of Food in the Last Year: Never true  Transportation Needs: No Transportation Needs (01/25/2024)   Epic    Lack of Transportation (Medical): No    Lack of Transportation (Non-Medical): No  Physical Activity:  Inactive (01/25/2024)   Exercise Vital Sign    Days of Exercise per Week: 0 days    Minutes of Exercise per Session: Not on file  Stress: No Stress Concern Present (01/25/2024)   [employer] of Occupational Health - Occupational Stress Questionnaire    Feeling of Stress: Only a little  Social Connections: Socially Isolated (01/25/2024)   Social Connection and Isolation Panel    Frequency of Communication with Friends and Family: More than three times a week    Frequency of Social Gatherings with Friends and Family: Twice a week    Attends Religious Services: Patient declined    Active Member of Clubs or Organizations: No    Attends Engineer, Structural: Not on file    Marital Status: Never married  Depression (PHQ2-9): Low Risk (03/29/2024)   Depression (PHQ2-9)     PHQ-2 Score: 4  Alcohol Screen: Low Risk (01/25/2024)   Alcohol Screen    Last Alcohol Screening Score (AUDIT): 2  Housing: Low Risk (01/25/2024)   Epic    Unable to Pay for Housing in the Last Year: No    Number of Times Moved in the Last Year: 0    Homeless in the Last Year: No  Utilities: Not on file  Health Literacy: Not on file   ROS See pertinent in HPI. All other systems reviewed and non contributory Blood pressure 125/80, pulse 80, height 5' 6 (1.676 m), weight 208 lb (94.3 kg). GENERAL: Well-developed, well-nourished female in no acute distress.  ABDOMEN: Soft, nontender, nondistended. No organomegaly. PELVIC: Normal external female genitalia. Vagina is pink with some atrophy.  Normal discharge. Normal appearing cervix. Uterus is normal in size. No adnexal mass or tenderness. Chaperone present during the pelvic exam EXTREMITIES: No cyanosis, clubbing, or edema, 2+ distal pulses.  A/P 53 yo with postmenopausal vaginal bleeding  - pap smear collected and updated - Urine culture collected - Pelvic ultrasound ordered - Discussed benefits of endometrial biopsy to complete the evaluation and rule out endometrial polyp or malignancy ENDOMETRIAL BIOPSY     The indications for endometrial biopsy were reviewed.   Risks of the biopsy including cramping, bleeding, infection, uterine perforation, inadequate specimen and need for additional procedures  were discussed. The patient states she understands and agrees to undergo procedure today. Consent was signed. Time out was performed. Urine HCG was negative. A sterile speculum was placed in the patient's vagina and the cervix was prepped with Betadine. A single-toothed tenaculum was placed on the anterior lip of the cervix to stabilize it. The uterine cavity was sounded to a depth of 7 cm using the uterine sound. The 3 mm pipelle was introduced into the endometrial cavity without difficulty, 2passes were made.  A  moderate amount of tissue was   sent to pathology. The instruments were removed from the patient's vagina. Minimal bleeding from the cervix was noted. The patient tolerated the procedure well.  Routine post-procedure instructions were given to the patient. The patient will follow up in two weeks to review the results and for further management.   - Sample of Uberlube provided for dyspareunia. If all evaluation is negative, discussed vaginal estrogen for management of dyspareunia. Patient verbalized understanding and all questions were answered

## 2024-03-31 ENCOUNTER — Ambulatory Visit

## 2024-03-31 DIAGNOSIS — N950 Postmenopausal bleeding: Secondary | ICD-10-CM

## 2024-03-31 LAB — URINE CULTURE

## 2024-04-03 ENCOUNTER — Ambulatory Visit: Payer: Self-pay | Admitting: Obstetrics and Gynecology

## 2024-04-03 LAB — SURGICAL PATHOLOGY

## 2024-04-04 NOTE — Telephone Encounter (Signed)
 LM for patient to call back. Arrie Lares, RN

## 2024-04-04 NOTE — Telephone Encounter (Signed)
-----   Message from Winton Felt, MD sent at 04/03/2024  4:07 PM EST ----- Please inform patient of negative endometrial biopsy without evidence of cancer or polyp

## 2024-04-04 NOTE — Telephone Encounter (Signed)
 Patient called back.  Made aware of negative EMB.  Erminio DELENA Rumps, RN

## 2024-04-06 LAB — CYTOLOGY - PAP
Comment: NEGATIVE
Diagnosis: NEGATIVE
High risk HPV: NEGATIVE

## 2024-04-11 ENCOUNTER — Other Ambulatory Visit: Payer: Self-pay | Admitting: Obstetrics and Gynecology

## 2024-04-11 ENCOUNTER — Telehealth: Payer: Self-pay

## 2024-04-11 MED ORDER — ESTRADIOL 0.01 % VA CREA: TOPICAL_CREAM | VAGINAL | 12 refills | Status: AC

## 2024-04-11 NOTE — Telephone Encounter (Signed)
 RN spoke with patient per request of provider Dr. Alger and informed patient of normal pelvic ultrasound with small fibroids. RN also relayed message from provider that patient can keep a bleeding calendar if bleeding returns. If interested, Dr. Alger offered to send a prescription for vaginal estrogen as discussed in the office.Patient agreed, message sent to provider. Pt also requested annual appointment. Message sent to front office to schedule.  Silvano LELON Piano, RN

## 2024-04-12 ENCOUNTER — Telehealth: Payer: Self-pay | Admitting: *Deleted

## 2024-04-12 NOTE — Telephone Encounter (Signed)
 Let patient a message to call and schedule annual.

## 2024-05-23 ENCOUNTER — Ambulatory Visit: Admitting: Family Medicine
